# Patient Record
Sex: Male | Born: 1950 | Race: White | Hispanic: No | Marital: Married | Smoking: Former smoker
Health system: Southern US, Community
[De-identification: ages and names within clinical notes are randomized; demographics above are authoritative.]

## PROBLEM LIST (undated history)

## (undated) DIAGNOSIS — E119 Type 2 diabetes mellitus without complications: Secondary | ICD-10-CM

## (undated) DIAGNOSIS — D72819 Decreased white blood cell count, unspecified: Secondary | ICD-10-CM

## (undated) DIAGNOSIS — E785 Hyperlipidemia, unspecified: Secondary | ICD-10-CM

## (undated) DIAGNOSIS — H269 Unspecified cataract: Secondary | ICD-10-CM

## (undated) HISTORY — DX: Decreased white blood cell count, unspecified: D72.819

## (undated) HISTORY — DX: Type 2 diabetes mellitus without complications: E11.9

## (undated) HISTORY — DX: Hyperlipidemia, unspecified: E78.5

## (undated) HISTORY — DX: Unspecified cataract: H26.9

---

## 1995-10-20 DIAGNOSIS — E119 Type 2 diabetes mellitus without complications: Secondary | ICD-10-CM

## 1995-10-20 DIAGNOSIS — E118 Type 2 diabetes mellitus with unspecified complications: Secondary | ICD-10-CM | POA: Insufficient documentation

## 1995-10-20 DIAGNOSIS — E1169 Type 2 diabetes mellitus with other specified complication: Secondary | ICD-10-CM | POA: Insufficient documentation

## 1995-10-20 HISTORY — DX: Type 2 diabetes mellitus without complications: E11.9

## 1996-02-20 ENCOUNTER — Encounter: Payer: Self-pay | Admitting: Family Medicine

## 1996-02-20 DIAGNOSIS — E785 Hyperlipidemia, unspecified: Secondary | ICD-10-CM

## 1996-02-20 DIAGNOSIS — E1169 Type 2 diabetes mellitus with other specified complication: Secondary | ICD-10-CM | POA: Insufficient documentation

## 1996-02-20 HISTORY — DX: Hyperlipidemia, unspecified: E78.5

## 1997-01-19 ENCOUNTER — Encounter: Payer: Self-pay | Admitting: Family Medicine

## 1997-01-19 LAB — CONVERTED CEMR LAB: Hgb A1c MFr Bld: 6.3 %

## 1997-12-19 ENCOUNTER — Encounter: Payer: Self-pay | Admitting: Family Medicine

## 1997-12-19 LAB — CONVERTED CEMR LAB: Hgb A1c MFr Bld: 6.2 %

## 1998-01-19 ENCOUNTER — Encounter: Payer: Self-pay | Admitting: Family Medicine

## 1999-01-20 ENCOUNTER — Encounter: Payer: Self-pay | Admitting: Family Medicine

## 1999-01-20 LAB — CONVERTED CEMR LAB
Hgb A1c MFr Bld: 6 %
Microalbumin U total vol: 7.3 mg/L

## 2000-01-20 ENCOUNTER — Encounter: Payer: Self-pay | Admitting: Family Medicine

## 2000-01-20 LAB — CONVERTED CEMR LAB: Hgb A1c MFr Bld: 5.9 %

## 2000-07-19 ENCOUNTER — Encounter: Payer: Self-pay | Admitting: Family Medicine

## 2000-07-19 LAB — CONVERTED CEMR LAB: Hgb A1c MFr Bld: 5.7 %

## 2001-01-19 ENCOUNTER — Encounter: Payer: Self-pay | Admitting: Family Medicine

## 2001-01-19 LAB — CONVERTED CEMR LAB
Hgb A1c MFr Bld: 6.6 %
PSA: 0.2 ng/mL

## 2001-06-19 ENCOUNTER — Encounter: Payer: Self-pay | Admitting: Family Medicine

## 2002-01-19 ENCOUNTER — Encounter: Payer: Self-pay | Admitting: Family Medicine

## 2002-01-19 LAB — CONVERTED CEMR LAB
Hgb A1c MFr Bld: 6.2 %
Microalbumin U total vol: 10.2 mg/L
PSA: 0.2 ng/mL

## 2002-07-20 ENCOUNTER — Encounter: Payer: Self-pay | Admitting: Family Medicine

## 2003-03-20 ENCOUNTER — Encounter: Payer: Self-pay | Admitting: Family Medicine

## 2003-03-20 LAB — CONVERTED CEMR LAB: Microalbumin U total vol: 6.7 mg/L

## 2003-09-20 ENCOUNTER — Encounter: Payer: Self-pay | Admitting: Family Medicine

## 2003-11-29 ENCOUNTER — Ambulatory Visit: Payer: Self-pay | Admitting: Family Medicine

## 2004-03-19 ENCOUNTER — Encounter: Payer: Self-pay | Admitting: Family Medicine

## 2004-03-27 ENCOUNTER — Ambulatory Visit: Payer: Self-pay | Admitting: Family Medicine

## 2004-03-31 ENCOUNTER — Ambulatory Visit: Payer: Self-pay | Admitting: Family Medicine

## 2004-09-19 ENCOUNTER — Ambulatory Visit: Payer: Self-pay | Admitting: Family Medicine

## 2004-09-19 LAB — CONVERTED CEMR LAB: Hgb A1c MFr Bld: 6.4 %

## 2004-09-23 ENCOUNTER — Ambulatory Visit: Payer: Self-pay | Admitting: Family Medicine

## 2004-10-07 ENCOUNTER — Ambulatory Visit: Payer: Self-pay | Admitting: Family Medicine

## 2004-12-02 ENCOUNTER — Ambulatory Visit: Payer: Self-pay | Admitting: Family Medicine

## 2005-02-19 ENCOUNTER — Encounter: Payer: Self-pay | Admitting: Family Medicine

## 2005-03-23 ENCOUNTER — Ambulatory Visit: Payer: Self-pay | Admitting: Family Medicine

## 2005-10-19 ENCOUNTER — Encounter: Payer: Self-pay | Admitting: Family Medicine

## 2005-10-19 LAB — CONVERTED CEMR LAB
Hgb A1c MFr Bld: 6.5 %
Microalbumin U total vol: 3.8 mg/L

## 2005-10-20 ENCOUNTER — Ambulatory Visit: Payer: Self-pay | Admitting: Family Medicine

## 2005-11-06 ENCOUNTER — Ambulatory Visit: Payer: Self-pay | Admitting: Family Medicine

## 2005-11-19 ENCOUNTER — Ambulatory Visit: Payer: Self-pay | Admitting: Family Medicine

## 2006-04-20 ENCOUNTER — Encounter: Payer: Self-pay | Admitting: Family Medicine

## 2006-04-20 LAB — CONVERTED CEMR LAB: Hgb A1c MFr Bld: 6.6 %

## 2006-05-06 ENCOUNTER — Ambulatory Visit: Payer: Self-pay | Admitting: Family Medicine

## 2006-05-06 LAB — CONVERTED CEMR LAB: Hgb A1c MFr Bld: 6.6 % — ABNORMAL HIGH (ref 4.6–6.0)

## 2006-05-10 ENCOUNTER — Ambulatory Visit: Payer: Self-pay | Admitting: Family Medicine

## 2006-06-21 ENCOUNTER — Telehealth: Payer: Self-pay | Admitting: Family Medicine

## 2006-10-07 ENCOUNTER — Encounter: Payer: Self-pay | Admitting: Family Medicine

## 2006-11-08 ENCOUNTER — Ambulatory Visit: Payer: Self-pay | Admitting: Family Medicine

## 2006-11-08 DIAGNOSIS — I10 Essential (primary) hypertension: Secondary | ICD-10-CM | POA: Insufficient documentation

## 2006-11-08 LAB — CONVERTED CEMR LAB
AST: 25 units/L (ref 0–37)
Albumin: 4 g/dL (ref 3.5–5.2)
Alkaline Phosphatase: 43 units/L (ref 39–117)
BUN: 16 mg/dL (ref 6–23)
Basophils Absolute: 0 10*3/uL (ref 0.0–0.1)
Basophils Relative: 0.6 % (ref 0.0–1.0)
CO2: 31 meq/L (ref 19–32)
Chloride: 106 meq/L (ref 96–112)
Cholesterol: 139 mg/dL (ref 0–200)
Creatinine, Ser: 1.1 mg/dL (ref 0.4–1.5)
HCT: 39.1 % (ref 39.0–52.0)
LDL Cholesterol: 84 mg/dL (ref 0–99)
MCHC: 35.1 g/dL (ref 30.0–36.0)
Neutrophils Relative %: 60.5 % (ref 43.0–77.0)
PSA: 0.23 ng/mL (ref 0.10–4.00)
RBC: 4.09 M/uL — ABNORMAL LOW (ref 4.22–5.81)
RDW: 12.6 % (ref 11.5–14.6)
Sodium: 142 meq/L (ref 135–145)
TSH: 2.9 microintl units/mL (ref 0.35–5.50)
Total Bilirubin: 0.9 mg/dL (ref 0.3–1.2)
Total CHOL/HDL Ratio: 4.2
VLDL: 22 mg/dL (ref 0–40)
WBC: 4 10*3/uL — ABNORMAL LOW (ref 4.5–10.5)

## 2006-11-15 ENCOUNTER — Ambulatory Visit: Payer: Self-pay | Admitting: Family Medicine

## 2006-12-06 ENCOUNTER — Encounter: Payer: Self-pay | Admitting: Family Medicine

## 2006-12-07 ENCOUNTER — Ambulatory Visit: Payer: Self-pay | Admitting: Family Medicine

## 2006-12-08 ENCOUNTER — Encounter (INDEPENDENT_AMBULATORY_CARE_PROVIDER_SITE_OTHER): Payer: Self-pay | Admitting: *Deleted

## 2007-05-02 ENCOUNTER — Ambulatory Visit: Payer: Self-pay | Admitting: Family Medicine

## 2007-05-04 ENCOUNTER — Ambulatory Visit: Payer: Self-pay | Admitting: Family Medicine

## 2007-06-09 ENCOUNTER — Ambulatory Visit: Payer: Self-pay | Admitting: Family Medicine

## 2007-11-30 ENCOUNTER — Ambulatory Visit: Payer: Self-pay | Admitting: Family Medicine

## 2007-11-30 LAB — CONVERTED CEMR LAB
ALT: 24 units/L (ref 0–53)
AST: 24 units/L (ref 0–37)
Albumin: 3.9 g/dL (ref 3.5–5.2)
BUN: 15 mg/dL (ref 6–23)
CO2: 29 meq/L (ref 19–32)
Chloride: 106 meq/L (ref 96–112)
Cholesterol: 116 mg/dL (ref 0–200)
Creatinine, Ser: 1.1 mg/dL (ref 0.4–1.5)
Creatinine,U: 205.7 mg/dL
Glucose, Bld: 105 mg/dL — ABNORMAL HIGH (ref 70–99)
PSA: 0.22 ng/mL (ref 0.10–4.00)
Triglycerides: 76 mg/dL (ref 0–149)

## 2007-12-05 ENCOUNTER — Ambulatory Visit: Payer: Self-pay | Admitting: Family Medicine

## 2007-12-12 ENCOUNTER — Encounter: Payer: Self-pay | Admitting: Family Medicine

## 2007-12-23 ENCOUNTER — Ambulatory Visit: Payer: Self-pay | Admitting: Family Medicine

## 2007-12-23 LAB — CONVERTED CEMR LAB
OCCULT 2: NEGATIVE
OCCULT 3: NEGATIVE

## 2007-12-26 ENCOUNTER — Encounter (INDEPENDENT_AMBULATORY_CARE_PROVIDER_SITE_OTHER): Payer: Self-pay | Admitting: *Deleted

## 2008-03-22 ENCOUNTER — Encounter (INDEPENDENT_AMBULATORY_CARE_PROVIDER_SITE_OTHER): Payer: Self-pay | Admitting: *Deleted

## 2008-03-22 ENCOUNTER — Telehealth: Payer: Self-pay | Admitting: Family Medicine

## 2008-03-26 ENCOUNTER — Encounter: Payer: Self-pay | Admitting: Family Medicine

## 2008-12-10 ENCOUNTER — Ambulatory Visit: Payer: Self-pay | Admitting: Family Medicine

## 2008-12-10 LAB — CONVERTED CEMR LAB
ALT: 26 units/L (ref 0–53)
Bilirubin, Direct: 0 mg/dL (ref 0.0–0.3)
Chloride: 103 meq/L (ref 96–112)
Creatinine, Ser: 1.2 mg/dL (ref 0.4–1.5)
LDL Cholesterol: 81 mg/dL (ref 0–99)
Microalb, Ur: 0.4 mg/dL (ref 0.0–1.9)
Potassium: 4.5 meq/L (ref 3.5–5.1)
Total Bilirubin: 1 mg/dL (ref 0.3–1.2)
Total CHOL/HDL Ratio: 4
Triglycerides: 111 mg/dL (ref 0.0–149.0)
VLDL: 22.2 mg/dL (ref 0.0–40.0)

## 2008-12-17 ENCOUNTER — Ambulatory Visit: Payer: Self-pay | Admitting: Family Medicine

## 2009-01-04 ENCOUNTER — Ambulatory Visit: Payer: Self-pay | Admitting: Family Medicine

## 2009-01-04 LAB — CONVERTED CEMR LAB
OCCULT 1: NEGATIVE
OCCULT 2: NEGATIVE
OCCULT 3: NEGATIVE

## 2009-01-07 ENCOUNTER — Encounter: Payer: Self-pay | Admitting: Family Medicine

## 2009-06-10 ENCOUNTER — Ambulatory Visit: Payer: Self-pay | Admitting: Family Medicine

## 2009-06-10 LAB — CONVERTED CEMR LAB: Hgb A1c MFr Bld: 7 % — ABNORMAL HIGH (ref 4.6–6.5)

## 2009-06-18 ENCOUNTER — Ambulatory Visit: Payer: Self-pay | Admitting: Family Medicine

## 2009-08-22 ENCOUNTER — Encounter (INDEPENDENT_AMBULATORY_CARE_PROVIDER_SITE_OTHER): Payer: Self-pay | Admitting: *Deleted

## 2009-10-24 ENCOUNTER — Ambulatory Visit: Payer: Self-pay | Admitting: Family Medicine

## 2009-10-24 LAB — CONVERTED CEMR LAB
AST: 18 units/L (ref 0–37)
Cholesterol: 136 mg/dL (ref 0–200)

## 2009-10-30 ENCOUNTER — Ambulatory Visit: Payer: Self-pay | Admitting: Family Medicine

## 2009-12-16 ENCOUNTER — Encounter: Payer: Self-pay | Admitting: Family Medicine

## 2010-02-18 NOTE — Assessment & Plan Note (Signed)
Summary: 6 MONTH FOLLOW UP/[facility]H   Vital Signs:  Patient profile:   60 year old male Weight:      169.75 pounds Temp:     98.3 degrees F oral Pulse rate:   76 / minute Pulse rhythm:   regular BP sitting:   108 / 76  (left arm) Cuff size:   regular  Vitals Entered By: Sydell Axon LPN (Jun 18, 2009 3:21 PM) CC: 6 Month follow-up   History of Present Illness: Pt here to recheck DM. He thinks his numbers lately have been better but essentially only checks them in the AM. He is aware of some numbers a few months ago being as high as 200s. He does not admit to indiscretion with diet. We discussed again going to 4 day progressiojn of checking his sugar and the motivation to do so...getting sample throughout the day of sugars. He also relates needing "to go generic" if poss. There is no substitute for Actos and he is aware that would menan insulin...which he doesn't want or is not willing yet to do. He would however like to switcjh his Lipitor if poss.  Problems Prior to Update: 1)  Special Screening Malig Neoplasms Other Sites  (ICD-V76.49) 2)  Health Maintenance Exam  (ICD-V70.0) 3)  Hypertension, Benign Essential  (ICD-401.1) 4)  Screening For Malignant Neoplasm, Prostate  (ICD-V76.44) 5)  Hyperlipidemia  (ICD-272.4) 6)  Diabetes Mellitus, Type II  (ICD-250.00)  Medications Prior to Update: 1)  Lipitor 20 Mg Tabs (Atorvastatin Calcium) .... Take 1 Tablet By Mouth Once A Day 2)  Lisinopril 2.5 Mg Tabs (Lisinopril) .... Take 1 Tablet By Mouth Once A Day 3)  Metformin Hcl 1000 Mg  Tabs (Metformin Hcl) .... Take 1 Tablet By Mouth Two Times A Day 4)  Actos 45 Mg Tabs (Pioglitazone Hcl) .... One Tab By Mouth Once Daily 5)  Glucotrol Xl 10 Mg  Tb24 (Glipizide) .... Take 1 Tablet By Mouth in Am 1/2 Tablet At Bedtime 6)  Accu-Chek Advantage Test   Strp (Glucose Blood) .... Check Sugar Once Every Day  Allergies: 1)  ! Pcn  Physical Exam  General:  Well-developed,well-nourished,in no acute  distress; alert,appropriate and cooperative throughout examination Head:  Normocephalic and atraumatic without obvious abnormalities. No apparent alopecia or balding. Sinuses NT. Eyes:  Conjunctiva clear bilaterally.  Ears:  External ear exam shows no significant lesions or deformities.  Otoscopic examination reveals clear canals, tympanic membranes are intact bilaterally without bulging, retraction, inflammation or discharge. Hearing is grossly normal bilaterally. Nose:  External nasal examination shows no deformity or inflammation. Nasal mucosa are pink and moist without lesions or exudates. Mouth:  Oral mucosa and oropharynx without lesions or exudates.  Teeth in good repair. Neck:  No deformities, masses, or tenderness noted. Chest Wall:  No deformities, masses, tenderness or gynecomastia noted. Lungs:  Normal respiratory effort, chest expands symmetrically. Lungs are clear to auscultation, no crackles or wheezes. Heart:  Normal rate and regular rhythm. S1 and S2 normal without gallop, murmur, click, rub or other extra sounds.   Impression & Recommendations:  Problem # 1:  DIABETES MELLITUS, TYPE II (ICD-250.00) Assessment Deteriorated  Significantly worse...start 4 day progression of monitoring sugar. Declines increasing meds at this point. His updated medication list for this problem includes:    Lisinopril 2.5 Mg Tabs (Lisinopril) .Marland Kitchen... Take 1 tablet by mouth once a day    Metformin Hcl 1000 Mg Tabs (Metformin hcl) .Marland Kitchen... Take 1 tablet by mouth two times a day  Actos 45 Mg Tabs (Pioglitazone hcl) ..... One tab by mouth once daily    Glucotrol Xl 10 Mg Tb24 (Glipizide) .Marland Kitchen... Take 1 tablet by mouth in am 1/2 tablet at bedtime  Labs Reviewed: Creat: 1.2 (12/10/2008)   Microalbumin: 3.8 (10/19/2005)  Last Eye Exam: normal (12/10/2008) Reviewed HgBA1c results: 7.0 (06/10/2009)  6.9 (12/10/2008)  Problem # 2:  HYPERLIPIDEMIA (ICD-272.4) Assessment: Unchanged  Switch to  Pravastatin in 6 weeks and then labs 6 weeks and thrree month after that. Make appt to be seen after the seconfd set of labs. His updated medication list for this problem includes:    Lipitor 20 Mg Tabs (Atorvastatin calcium) .Marland Kitchen... Take 1 tablet by mouth once a day    Simvastatin 40 Mg Tabs (Simvastatin) ..... One tab by mouth at night  Labs Reviewed: SGOT: 27 (12/10/2008)   SGPT: 26 (12/10/2008)   HDL:38.70 (12/10/2008), 31.7 (11/30/2007)  LDL:81 (12/10/2008), 69 (11/30/2007)  Chol:142 (12/10/2008), 116 (11/30/2007)  Trig:111.0 (12/10/2008), 76 (11/30/2007)  Complete Medication List: 1)  Lipitor 20 Mg Tabs (Atorvastatin calcium) .... Take 1 tablet by mouth once a day 2)  Lisinopril 2.5 Mg Tabs (Lisinopril) .... Take 1 tablet by mouth once a day 3)  Metformin Hcl 1000 Mg Tabs (Metformin hcl) .... Take 1 tablet by mouth two times a day 4)  Actos 45 Mg Tabs (Pioglitazone hcl) .... One tab by mouth once daily 5)  Glucotrol Xl 10 Mg Tb24 (Glipizide) .... Take 1 tablet by mouth in am 1/2 tablet at bedtime 6)  Accu-chek Advantage Test Strp (Glucose blood) .... Check sugar once every day 7)  Simvastatin 40 Mg Tabs (Simvastatin) .... One tab by mouth at night  Patient Instructions: 1)  SGOT, SGPT 272. 4 mid Aug 2)   SGOT, SGPT, CHOL PROFILE  272. 4    last week of Sep or first week of Oct.  See me first week of Oct. Prescriptions: SIMVASTATIN 40 MG TABS (SIMVASTATIN) one tab by mouth at night  #90 x 3   Entered and Authorized by:   Shaune Leeks MD   Signed by:   Shaune Leeks MD on 06/18/2009   Method used:   Print then Give to Patient   RxID:   [id number]   Current Allergies (reviewed today): ! PCN

## 2010-02-18 NOTE — Letter (Signed)
Summary: Nadara Eaton letter  [facility]  [address] [place], [place] [address]   Phone: [phone]  Fax: [phone]       08/22/2009 MRN: [id number]  DANE KOPKE [address] [place], [place]  [address]  Dear Mr. Jeral[facility] [place], and [facility] announce the retirement of Arta Silence, M.D., from full-time practice at the [facility] office effective July 18, 2009 and his plans of returning part-time.  It is important to Dr. Hetty Ely and to our practice that you understand that [facility] has seven physicians in our office for your health care needs.  We will continue to offer the same exceptional care that you have today.    Dr. Hetty Ely has spoken to many of you about his plans for retirement and returning part-time in the fall.   We will continue to work with you through the transition to schedule appointments for you in the office and meet the high standards that [place] is committed to.   Again, it is with great pleasure that we share the news that Dr. Hetty Ely will return to [facility] at [facility] in October of 2011 with a reduced schedule.    If you have any questions, or would like to request an appointment with one of our physicians, please call us at [phone] and press the option for Scheduling an appointment.  We take pleasure in providing you with excellent patient care and look forward to seeing you at your next office visit.  Our [facility] Physicians are:  Tillman Abide, M.D. Laurita Quint, M.D. Roxy Manns, M.D. Kerby Nora, M.D. Hannah Beat, M.D. Ruthe Mannan, M.D. We proudly welcomed Raechel Ache, M.D. and Eustaquio Boyden, M.D. to the practice in July/August 2011.  Sincerely,   [facility]

## 2010-02-18 NOTE — Assessment & Plan Note (Signed)
Summary: F/U ON CHANGING MEDS/DLO   Vital Signs:  Patient profile:   60 year old male Weight:      172.75 pounds BMI:     25.98 Temp:     98.5 degrees F oral Pulse rate:   68 / minute Pulse rhythm:   regular BP sitting:   112 / 74  (left arm) Cuff size:   regular  Vitals Entered By: Sydell Axon LPN (October 30, 2009 12:03 PM) CC: Follow-up on change of medication   History of Present Illness: Pt here for followup. Blood sugars have been ranging 80-90 in AM, 130-160 at night. He has been more careful with his diet since being seen last time, both sugar and chol. He has had no probs with Sinmvastatin. Has been on it  since Sep 1. approx  6 weeks. We switched from Lipitor 20 to Simvastatin 40. He feels well and has noticed no problems with it.  Problems Prior to Update: 1)  Special Screening Malig Neoplasms Other Sites  (ICD-V76.49) 2)  Health Maintenance Exam  (ICD-V70.0) 3)  Hypertension, Benign Essential  (ICD-401.1) 4)  Screening For Malignant Neoplasm, Prostate  (ICD-V76.44) 5)  Hyperlipidemia  (ICD-272.4) 6)  Diabetes Mellitus, Type II  (ICD-250.00)  Medications Prior to Update: 1)  Lipitor 20 Mg Tabs (Atorvastatin Calcium) .... Take 1 Tablet By Mouth Once A Day 2)  Lisinopril 2.5 Mg Tabs (Lisinopril) .... Take 1 Tablet By Mouth Once A Day 3)  Metformin Hcl 1000 Mg  Tabs (Metformin Hcl) .... Take 1 Tablet By Mouth Two Times A Day 4)  Actos 45 Mg Tabs (Pioglitazone Hcl) .... One Tab By Mouth Once Daily 5)  Glucotrol Xl 10 Mg  Tb24 (Glipizide) .... Take 1 Tablet By Mouth in Am 1/2 Tablet At Bedtime 6)  Accu-Chek Advantage Test   Strp (Glucose Blood) .... Check Sugar Once Every Day 7)  Simvastatin 40 Mg Tabs (Simvastatin) .... One Tab By Mouth At Night  Allergies: 1)  ! Pcn  Physical Exam  General:  Well-developed,well-nourished,in no acute distress; alert,appropriate and cooperative throughout examination Head:  Normocephalic and atraumatic without obvious abnormalities.  No apparent alopecia or balding. Sinuses NT. Eyes:  Conjunctiva clear bilaterally.  Ears:  External ear exam shows no significant lesions or deformities.  Otoscopic examination reveals clear canals, tympanic membranes are intact bilaterally without bulging, retraction, inflammation or discharge. Hearing is grossly normal bilaterally. Nose:  External nasal examination shows no deformity or inflammation. Nasal mucosa are pink and moist without lesions or exudates. Mouth:  Oral mucosa and oropharynx without lesions or exudates.  Teeth in good repair. Neck:  No deformities, masses, or tenderness noted. Chest Wall:  No deformities, masses, tenderness or gynecomastia noted. Lungs:  Normal respiratory effort, chest expands symmetrically. Lungs are clear to auscultation, no crackles or wheezes. Heart:  Normal rate and regular rhythm. S1 and S2 normal without gallop, murmur, click, rub or other extra sounds. Abdomen:  Bowel sounds positive,abdomen soft and non-tender without masses, organomegaly or hernias noted.   Impression & Recommendations:  Problem # 1:  HYPERLIPIDEMIA (ICD-272.4) Assessment Improved Nos actually better on 40 of Simva vs 20 of Lipitor. Cont the Simva. The following medications were removed from the medication list:    Lipitor 20 Mg Tabs (Atorvastatin calcium) .Marland Kitchen... Take 1 tablet by mouth once a day His updated medication list for this problem includes:    Simvastatin 40 Mg Tabs (Simvastatin) ..... One tab by mouth at night  Labs Reviewed: SGOT: 18 (10/24/2009)  SGPT: 19 (10/24/2009)   HDL:41.60 (10/24/2009), 38.70 (12/10/2008)  LDL:75 (10/24/2009), 81 (12/10/2008)  Chol:136 (10/24/2009), 142 (12/10/2008)  Trig:98.0 (10/24/2009), 111.0 (12/10/2008)  Problem # 2:  DIABETES MELLITUS, TYPE II (ICD-250.00) Assessment: Unchanged  Nos sound slightly better which should translate to better A1C, altho not checked. Will do so at Comp Exam. His updated medication list for this  problem includes:    Lisinopril 2.5 Mg Tabs (Lisinopril) .Marland Kitchen... Take 1 tablet by mouth once a day    Metformin Hcl 1000 Mg Tabs (Metformin hcl) .Marland Kitchen... Take 1 tablet by mouth two times a day    Actos 45 Mg Tabs (Pioglitazone hcl) ..... One tab by mouth once daily    Glucotrol Xl 10 Mg Tb24 (Glipizide) .Marland Kitchen... Take 1 tablet by mouth in am 1/2 tablet at bedtime  Labs Reviewed: Creat: 1.2 (12/10/2008)   Microalbumin: 3.8 (10/19/2005)  Last Eye Exam: normal (12/10/2008) Reviewed HgBA1c results: 7.0 (06/10/2009)  6.9 (12/10/2008)  Problem # 3:  HYPERTENSION, BENIGN ESSENTIAL (ICD-401.1) Assessment: Unchanged Stable. Cont curr meds. His updated medication list for this problem includes:    Lisinopril 2.5 Mg Tabs (Lisinopril) .Marland Kitchen... Take 1 tablet by mouth once a day  BP today: 112/74 Prior BP: 108/76 (06/18/2009)  Labs Reviewed: K+: 4.5 (12/10/2008) Creat: : 1.2 (12/10/2008)   Chol: 136 (10/24/2009)   HDL: 41.60 (10/24/2009)   LDL: 75 (10/24/2009)   TG: 98.0 (10/24/2009)  Complete Medication List: 1)  Lisinopril 2.5 Mg Tabs (Lisinopril) .... Take 1 tablet by mouth once a day 2)  Metformin Hcl 1000 Mg Tabs (Metformin hcl) .... Take 1 tablet by mouth two times a day 3)  Actos 45 Mg Tabs (Pioglitazone hcl) .... One tab by mouth once daily 4)  Glucotrol Xl 10 Mg Tb24 (Glipizide) .... Take 1 tablet by mouth in am 1/2 tablet at bedtime 5)  Accu-chek Advantage Test Strp (Glucose blood) .... Check sugar once every day 6)  Simvastatin 40 Mg Tabs (Simvastatin) .... One tab by mouth at night  Patient Instructions: 1)  RTC after the New Year for Comp Exam, labs prior.  Current Allergies (reviewed today): ! PCN

## 2010-02-18 NOTE — Consult Note (Signed)
Summary: [facility],Note  John Yaokum,O.D.,[facility],Note   Imported By: Beau Fanny 12/17/2009 13:45:12  _____________________________________________________________________  External Attachment:    Type:   Image     Comment:   External Document  Appended Document: Chesley Noon    Clinical Lists Changes  Observations: Added new observation of DMEYEEXAMNXT: 12/2010 (12/17/2009 16:11) Added new observation of DMEYEEXMRES: normal (12/16/2009 16:12) Added new observation of EYE EXAM BY: [facility], Dr Bennett Scrape (12/16/2009 16:12) Added new observation of DIAB EYE EX: normal (12/16/2009 16:12)        Diabetes Management Exam:    Eye Exam:       Eye Exam done elsewhere          Date: 12/16/2009          Results: normal          Done by: [facility], Dr Bennett Scrape

## 2010-03-31 ENCOUNTER — Other Ambulatory Visit (INDEPENDENT_AMBULATORY_CARE_PROVIDER_SITE_OTHER): Payer: 59

## 2010-03-31 ENCOUNTER — Encounter: Payer: Self-pay | Admitting: *Deleted

## 2010-03-31 ENCOUNTER — Other Ambulatory Visit: Payer: Self-pay | Admitting: Family Medicine

## 2010-03-31 DIAGNOSIS — Z0000 Encounter for general adult medical examination without abnormal findings: Secondary | ICD-10-CM

## 2010-03-31 DIAGNOSIS — I10 Essential (primary) hypertension: Secondary | ICD-10-CM

## 2010-03-31 DIAGNOSIS — Z125 Encounter for screening for malignant neoplasm of prostate: Secondary | ICD-10-CM

## 2010-03-31 DIAGNOSIS — E785 Hyperlipidemia, unspecified: Secondary | ICD-10-CM

## 2010-03-31 DIAGNOSIS — E119 Type 2 diabetes mellitus without complications: Secondary | ICD-10-CM

## 2010-03-31 LAB — LIPID PANEL
Cholesterol: 149 mg/dL (ref 0–200)
LDL Cholesterol: 91 mg/dL (ref 0–99)
VLDL: 19.2 mg/dL (ref 0.0–40.0)

## 2010-03-31 LAB — RENAL FUNCTION PANEL
Albumin: 4.2 g/dL (ref 3.5–5.2)
Calcium: 8.9 mg/dL (ref 8.4–10.5)
Chloride: 100 mEq/L (ref 96–112)
Glucose, Bld: 154 mg/dL — ABNORMAL HIGH (ref 70–99)
Phosphorus: 3.9 mg/dL (ref 2.3–4.6)
Potassium: 4.7 mEq/L (ref 3.5–5.1)
Sodium: 137 mEq/L (ref 135–145)

## 2010-03-31 LAB — VITAMIN B12: Vitamin B-12: 314 pg/mL (ref 211–911)

## 2010-03-31 LAB — HEPATIC FUNCTION PANEL
ALT: 21 U/L (ref 0–53)
AST: 20 U/L (ref 0–37)
Albumin: 4.2 g/dL (ref 3.5–5.2)
Alkaline Phosphatase: 45 U/L (ref 39–117)

## 2010-03-31 LAB — MICROALBUMIN / CREATININE URINE RATIO: Microalb, Ur: 1 mg/dL (ref 0.0–1.9)

## 2010-03-31 LAB — HEMOGLOBIN A1C: Hgb A1c MFr Bld: 7 % — ABNORMAL HIGH (ref 4.6–6.5)

## 2010-04-03 ENCOUNTER — Encounter: Payer: Self-pay | Admitting: Family Medicine

## 2010-04-03 ENCOUNTER — Encounter (INDEPENDENT_AMBULATORY_CARE_PROVIDER_SITE_OTHER): Payer: 59 | Admitting: Family Medicine

## 2010-04-03 DIAGNOSIS — Z0000 Encounter for general adult medical examination without abnormal findings: Secondary | ICD-10-CM

## 2010-04-04 ENCOUNTER — Telehealth: Payer: Self-pay | Admitting: Family Medicine

## 2010-04-08 NOTE — Progress Notes (Signed)
Summary: Glucose machine, strips and lancets  Phone Note From Pharmacy Call back at [phone]   [name]: [facility] Call For: Dr. Hetty Ely  Summary of Call: Received faxed from pharmacy stating that the Accu-Check Advantage is no longer made and patient will need a new Rx.  Please advise. Initial call taken by: Linde Gillis CMA Duncan Dull),  April 04, 2010 11:02 AM  Follow-up for Phone Call        Pls give vebal order for "Glucometer of choice" with related acoutrements...lancets, strips, etc.  Follow-up by: Shaune Leeks MD,  April 04, 2010 12:16 PM    New/Updated Medications: BLOOD GLUCOSE SYSTEM PAK  KIT (BLOOD GLUCOSE MONITORING SUPPL) Patients choice of device: Use to check blood sugar once daily LANCETS  MISC (LANCETS) use to check blood sugar once daily CHOICE DM FORA G20 TEST STRIPS  STRP (GLUCOSE BLOOD) Patients choice: Use to check blood sugar once daily Prescriptions: CHOICE DM FORA G20 TEST STRIPS  STRP (GLUCOSE BLOOD) Patients choice: Use to check blood sugar once daily  #100 x 11   Entered by:   Linde Gillis CMA (AAMA)   Authorized by:   Shaune Leeks MD   Signed by:   Linde Gillis CMA (AAMA) on 04/04/2010   Method used:   Electronically to        [facility] (retail)       [address], [address]       [place], [place]  [address]       Ph: [phone]       Fax: [phone]   RxID:   [id number] LANCETS  MISC (LANCETS) use to check blood sugar once daily  #100 x 11   Entered by:   Linde Gillis CMA (AAMA)   Authorized by:   Shaune Leeks MD   Signed by:   Linde Gillis CMA (AAMA) on 04/04/2010   Method used:   Electronically to        [facility] (retail)       [address], [address]       [place], [place]  [address]       Ph: [phone]       Fax: [phone]   RxID:   [id number] BLOOD GLUCOSE SYSTEM PAK  KIT (BLOOD GLUCOSE MONITORING  SUPPL) Patients choice of device: Use to check blood sugar once daily  #1 x 0   Entered by:   Linde Gillis CMA (AAMA)   Authorized by:   Shaune Leeks MD   Signed by:   Linde Gillis CMA (AAMA) on 04/04/2010   Method used:   Electronically to        [facility] (retail)       [address], [address]       [place], [place]  [address]       Ph: [phone]       Fax: [phone]   RxID:   [id number]   Current Allergies (reviewed today): ! PCN

## 2010-04-08 NOTE — Assessment & Plan Note (Signed)
Summary: CPX/ [facility]   Vital Signs:  Patient profile:   60 year old male Height:      68.25 inches Weight:      170.50 pounds BMI:     25.83 Temp:     98.2 degrees F oral Pulse rate:   60 / minute Pulse rhythm:   regular BP sitting:   106 / 64  (left arm) Cuff size:   regular  Vitals Entered By: Lewanda Rife LPN (April 15, 2010 10:57 AM) CC: CPX   History of Present Illness: Pt here for Comp Exam. He feels well with no complaints.  His accucheck the day of bloodwork was 86. He typically is max 130.   Preventive Screening-Counseling & Management  Alcohol-Tobacco     Alcohol drinks/day: 0     Smoking Status: quit     Year Quit: 1987     Pack years: 20     Passive Smoke Exposure: yes  Caffeine-Diet-Exercise     Caffeine use/day: 2     Does Patient Exercise: yes     Type of exercise: bike, jogging     Times/week: 2  Problems Prior to Update: 1)  Special Screening Malig Neoplasms Other Sites  (ICD-V76.49) 2)  Health Maintenance Exam  (ICD-V70.0) 3)  Hypertension, Benign Essential  (ICD-401.1) 4)  Screening For Malignant Neoplasm, Prostate  (ICD-V76.44) 5)  Hyperlipidemia  (ICD-272.4) 6)  Diabetes Mellitus, Type II  (ICD-250.00)  Medications Prior to Update: 1)  Lisinopril 2.5 Mg Tabs (Lisinopril) .... Take 1 Tablet By Mouth Once A Day 2)  Metformin Hcl 1000 Mg  Tabs (Metformin Hcl) .... Take 1 Tablet By Mouth Two Times A Day 3)  Actos 45 Mg Tabs (Pioglitazone Hcl) .... One Tab By Mouth Once Daily 4)  Glucotrol Xl 10 Mg  Tb24 (Glipizide) .... Take 1 Tablet By Mouth in Am 1/2 Tablet At Bedtime 5)  Accu-Chek Advantage Test   Strp (Glucose Blood) .... Check Sugar Once Every Day 6)  Simvastatin 40 Mg Tabs (Simvastatin) .... One Tab By Mouth At Night  Current Medications (verified): 1)  Lisinopril 2.5 Mg Tabs (Lisinopril) .... Take 1 Tablet By Mouth Once A Day 2)  Metformin Hcl 1000 Mg  Tabs (Metformin Hcl) .... Take 1 Tablet By Mouth Two Times A Day 3)  Actos 45 Mg Tabs  (Pioglitazone Hcl) .... One Tab By Mouth Once Daily 4)  Glucotrol Xl 10 Mg  Tb24 (Glipizide) .... Take 1 Tablet By Mouth in Am 1/2 Tablet At Bedtime 5)  Accu-Chek Advantage Test   Strp (Glucose Blood) .... Check Sugar Once Every Day 6)  Simvastatin 40 Mg Tabs (Simvastatin) .... One Tab By Mouth At Night 7)  Aspirin 325 Mg  Tabs (Aspirin) .... Take 1 Tablet By Mouth Once A Day 8)  Vitamin C 500 Mg  Tabs (Ascorbic Acid) .... Take 1 Tablet By Mouth Once A Day  Allergies: 1)  ! 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 lives w/ wife 3 children  1 out of home 1 grandchild  Risk Factors: Alcohol Use: 0 (04/03/2010) Caffeine Use: 2 (04/03/2010) Exercise: yes (04/03/2010)  Risk Factors: Smoking Status: quit (04/03/2010) Passive Smoke Exposure: yes (04/03/2010)  Family History: father died age 47 August 17, 2022 arrhythm mother died age 70 09/19/2022 htn, dm,  stroke, bilat BKA, MI, kidney failure-dialysis Brother A 61  Brother A 53 CV + gm, chf, cabg, mother mi, father arrhythm HBP + motherDM + mother, self CA + lung gm depressioin negETOH/drug neg Stroke pos mother  Social History: Caffeine use/day:  2  Review of Systems General:  Denies chills, fatigue, fever, sweats, weakness, and weight loss. Eyes:  Denies blurring, discharge, and eye pain. ENT:  Denies decreased hearing, ear discharge, and ringing in ears. CV:  Denies chest pain or discomfort, fainting,  fatigue, palpitations, shortness of breath with exertion, swelling of feet, and swelling of hands. Resp:  Denies cough, shortness of breath, and wheezing. GI:  Denies abdominal pain, bloody stools, change in bowel habits, constipation, dark tarry stools, diarrhea, indigestion, loss of appetite, nausea, vomiting, vomiting blood, and yellowish skin color. GU:  Complains of nocturia; denies discharge, dysuria, and urinary frequency; occas. MS:  Denies joint pain, low back pain, muscle aches, cramps, and stiffness. Derm:  Denies dryness, itching, and rash. Neuro:  Denies numbness, poor balance, tingling, and tremors.  Physical Exam  General:  Well-developed,well-nourished,in no acute distress; alert,appropriate and cooperative throughout examination Head:  Normocephalic and atraumatic without obvious abnormalities. No apparent alopecia or balding. Sinuses NT. Eyes:  Conjunctiva clear bilaterally.  Ears:  External ear exam shows no significant lesions or deformities.  Otoscopic examination reveals clear canals, tympanic membranes are intact bilaterally without bulging, retraction, inflammation or discharge. Hearing is grossly normal bilaterally. Nose:  External nasal examination shows no deformity or inflammation. Nasal mucosa are pink and moist without lesions or exudates. Mouth:  Oral mucosa and oropharynx without lesions or exudates.  Teeth in good repair. Neck:  No deformities, masses, or tenderness noted. Chest Wall:  No deformities, masses, tenderness or gynecomastia noted. Breasts:  No masses or gynecomastia noted Lungs:  Normal respiratory effort, chest expands symmetrically. Lungs are clear to auscultation, no crackles or wheezes. Heart:  Normal rate and regular rhythm. S1 and S2 normal without gallop, murmur, click, rub or other extra sounds. Abdomen:  Bowel sounds positive,abdomen soft and non-tender without masses, organomegaly or hernias noted. Rectal:  No external abnormalities noted.  Normal sphincter tone. No rectal masses or tenderness. G neg. Genitalia:  Testes bilaterally descended without nodularity, tenderness or masses. No scrotal masses or lesions. No penis lesions or urethral discharge. Prostate:  Prostate gland firm and smooth, no enlargement, nodularity, tenderness, mass, asymmetry or induration. 20 gms Msk:  No deformity or scoliosis noted of thoracic or lumbar spine.   Pulses:  R and L carotid,radial,femoral,dorsalis pedis and posterior tibial pulses are full and equal bilaterally Extremities:  No clubbing, cyanosis, edema, or deformity noted with normal full range of motion of all joints.   Neurologic:  No cranial nerve deficits noted. Station and gait are normal. Sensory, motor and coordinative functions appear intact. Skin:  Intact without suspicious lesions or rashes Cervical Nodes:  No lymphadenopathy noted Inguinal Nodes:  No significant adenopathy Psych:  Cognition and judgment appear intact. Alert and cooperative with normal attention span and concentration. No apparent delusions, illusions, hallucinations   Impression & Recommendations:  Problem # 1:  HEALTH MAINTENANCE EXAM (ICD-V70.0) Assessment Comment Only  Reviewed preventive care protocols, scheduled due services, and  updated immunizations.  Problem # 2:  HYPERTENSION, BENIGN ESSENTIAL (ICD-401.1) Assessment: Unchanged Stable. His updated medication list for this problem includes:    Lisinopril 2.5 Mg Tabs (Lisinopril) .Marland Kitchen... Take 1 tablet by mouth once a day  BP today: 106/64 Prior BP: 112/74 (10/30/2009)  Labs Reviewed: K+: 4.7 (03/31/2010) Creat: : 1.2 (03/31/2010)   Chol: 149 (03/31/2010)   HDL: 38.70 (03/31/2010)   LDL: 91 (03/31/2010)   TG: 96.0 (03/31/2010)  Problem # 3:  DIABETES MELLITUS, TYPE II (ICD-250.00) Assessment: Unchanged Adequate but he can get his sugar lower. Discussed risk of Actos. His chol is fine, he takes good care of himself...should do well. He needs the  sugar control. His updated medication list for this problem includes:    Lisinopril 2.5 Mg Tabs (Lisinopril) .Marland Kitchen... Take 1 tablet by mouth once a day    Metformin Hcl 1000 Mg Tabs (Metformin hcl) .Marland Kitchen... Take 1 tablet by mouth two times a day    Actos 45 Mg Tabs (Pioglitazone hcl) ..... One tab by mouth once daily    Glucotrol Xl 10 Mg Tb24 (Glipizide) .Marland Kitchen... Take 1 tablet by mouth in am 1/2 tablet at bedtime    Aspirin 325 Mg Tabs (Aspirin) .Marland Kitchen... Take 1 tablet by mouth once a day  Labs Reviewed: Creat: 1.2 (03/31/2010)   Microalbumin: 3.8 (10/19/2005)  Last Eye Exam: normal (12/16/2009) Reviewed HgBA1c results: 7.0 (03/31/2010)  7.0 (06/10/2009)  Problem # 4:  HYPERLIPIDEMIA (ICD-272.4) Assessment: Unchanged Adequate but can get lower by being more careful. His updated medication list for this problem includes:    Simvastatin 40 Mg Tabs (Simvastatin) ..... One tab by mouth at night  Labs Reviewed: SGOT: 20 (03/31/2010)   SGPT: 21 (03/31/2010)   HDL:38.70 (03/31/2010), 41.60 (10/24/2009)  LDL:91 (03/31/2010), 75 (10/24/2009)  Chol:149 (03/31/2010), 136 (10/24/2009)  Trig:96.0 (03/31/2010), 98.0 (10/24/2009)  Complete Medication List: 1)  Lisinopril 2.5 Mg Tabs (Lisinopril) .... Take 1 tablet by mouth once a day 2)  Metformin Hcl 1000 Mg Tabs (Metformin hcl) .... Take 1 tablet by mouth two times a day 3)  Actos 45 Mg Tabs (Pioglitazone hcl) .... One tab by mouth once daily 4)  Glucotrol Xl 10 Mg Tb24 (Glipizide) .... Take 1 tablet by mouth in am 1/2 tablet at bedtime 5)  Accu-chek Advantage Test Strp (Glucose blood) .... Check sugar once every day 6)  Simvastatin 40 Mg Tabs (Simvastatin) .... One tab by mouth at night 7)  Aspirin 325 Mg Tabs (Aspirin) .... Take 1 tablet by mouth once a day 8)  Vitamin C 500 Mg Tabs (Ascorbic acid) .... Take 1 tablet by mouth once a day  Patient Instructions: 1)  RTC 6 mos, A1C prior Prescriptions: ACCU-CHEK ADVANTAGE TEST   STRP (GLUCOSE BLOOD) check  sugar once every day  #100 x 4   Entered and Authorized by:   Shaune Leeks MD   Signed by:   Shaune Leeks MD on 04/03/2010   Method used:   Electronically to        [facility] [address] (retail)       [address], [address]       [place], [place]  [address]       Ph: [phone]       Fax: [phone]   RxID:   [id number] SIMVASTATIN 40 MG TABS (SIMVASTATIN) one tab by mouth at night  #90 x 3   Entered and Authorized by:  Shaune Leeks MD   Signed by:   Shaune Leeks MD on 04/03/2010   Method used:   Electronically to        [facility] [address] (retail)       [address], [address]       [place], [place]  [address]       Ph: [phone]       Fax: [phone]   RxID:   [id number] ACTOS 45 MG TABS (PIOGLITAZONE HCL) one tab by mouth once daily  #90 x 3   Entered and Authorized by:   Shaune Leeks MD   Signed by:   Shaune Leeks MD on 04/03/2010   Method used:   Electronically to        [facility] [address] (retail)       [address], [address]       [place], [place]  [address]       Ph: [phone]       Fax: [phone]   RxID:   [id number] METFORMIN HCL 1000 MG  TABS (METFORMIN HCL) Take 1 tablet by mouth two times a day  #180 x 3   Entered and Authorized by:   Shaune Leeks MD   Signed by:   Shaune Leeks MD on 04/03/2010   Method used:   Electronically to        [facility] [address] (retail)       [address], [address]       [place], [place]  [address]       Ph: [phone]       Fax: [phone]   RxID:   [id number] LISINOPRIL 2.5 MG TABS (LISINOPRIL) Take 1 tablet by mouth once a day  #90 x 3   Entered and Authorized by:   Shaune Leeks MD   Signed by:   Shaune Leeks MD on 04/03/2010   Method used:   Electronically to        [facility] [address] (retail)       [address], [address]       [place], [place]  [address]       Ph: [phone]       Fax: [phone]   RxID:   [id number]    Orders Added: 1)  Est. Patient 40-64 years [[id number]]    Current Allergies (reviewed today): ! PCN

## 2010-04-08 NOTE — Letter (Signed)
Summary: [facility]: Immunoassay Fecal Occult Blood (iFOB) Order Form  [place] [facility]  [address] [place], [place] [address]   Phone: [phone]  Fax: [phone]      [facility]: Immunoassay Fecal Occult Blood (iFOB) Order Form   April 03, 2010 MRN: [id number]   Eduardo Pierce May 13, 1950   Physicican Name:______Dr Schaller___________________  Diagnosis Code:_______V70.0___________________      Eduardo Leeks MD

## 2010-04-10 ENCOUNTER — Other Ambulatory Visit: Payer: Self-pay | Admitting: *Deleted

## 2010-04-10 MED ORDER — GLIPIZIDE 10 MG PO TABS: ORAL_TABLET | ORAL | Status: DC

## 2010-04-15 ENCOUNTER — Other Ambulatory Visit (INDEPENDENT_AMBULATORY_CARE_PROVIDER_SITE_OTHER): Payer: 59 | Admitting: Family Medicine

## 2010-04-15 ENCOUNTER — Other Ambulatory Visit: Payer: 59

## 2010-04-15 DIAGNOSIS — Z1289 Encounter for screening for malignant neoplasm of other sites: Secondary | ICD-10-CM

## 2010-04-15 LAB — FECAL OCCULT BLOOD, GUAIAC: Fecal Occult Blood: NEGATIVE

## 2010-04-15 LAB — FECAL OCCULT BLOOD, IMMUNOCHEMICAL: Fecal Occult Bld: NEGATIVE

## 2010-04-16 ENCOUNTER — Encounter: Payer: Self-pay | Admitting: *Deleted

## 2010-09-25 ENCOUNTER — Encounter: Payer: Self-pay | Admitting: Family Medicine

## 2010-10-06 ENCOUNTER — Other Ambulatory Visit (INDEPENDENT_AMBULATORY_CARE_PROVIDER_SITE_OTHER): Payer: 59

## 2010-10-06 DIAGNOSIS — E119 Type 2 diabetes mellitus without complications: Secondary | ICD-10-CM

## 2010-10-09 ENCOUNTER — Encounter: Payer: Self-pay | Admitting: Family Medicine

## 2010-10-09 ENCOUNTER — Ambulatory Visit (INDEPENDENT_AMBULATORY_CARE_PROVIDER_SITE_OTHER): Payer: 59 | Admitting: Family Medicine

## 2010-10-09 DIAGNOSIS — E785 Hyperlipidemia, unspecified: Secondary | ICD-10-CM

## 2010-10-09 DIAGNOSIS — I10 Essential (primary) hypertension: Secondary | ICD-10-CM

## 2010-10-09 DIAGNOSIS — E119 Type 2 diabetes mellitus without complications: Secondary | ICD-10-CM

## 2010-10-09 NOTE — Patient Instructions (Signed)
Please schedule pt with Dr Reece Agar for followup and annual PE in 6 mos., labs prior.

## 2010-10-09 NOTE — Assessment & Plan Note (Signed)
Continues well controlled. Cont minimal Lisinopril. BP Readings from Last 3 Encounters:  10/09/10 114/76  04/03/10 106/64  10/30/09 112/74

## 2010-10-09 NOTE — Assessment & Plan Note (Signed)
LDL and Trigs were great when checked and with his improved exercise regimen, HDL should be improved.  Cont curr regimen.

## 2010-10-09 NOTE — Assessment & Plan Note (Addendum)
Good control which has improved over the last 6 mos. Cont good but hard work. Understands to look for edema, SOB or CP with Actos. Has reasonable lipid control on his statin. Lab Results  Component Value Date   HGBA1C 6.7* 10/06/2010

## 2010-10-09 NOTE — Progress Notes (Signed)
  Subjective:    Patient ID: Eduardo Pierce, male    DOB: October 12, 1950, 60 y.o.   MRN: [id number]  HPI Pt here for 6 month follow up of DM, Htn and Hyperchol. He has no complaints and feels well. His A1C has come down slightly and he has learned exercise is important for him. We discussed Actos and its risks. He understands. He has worked hard at keeping things under control.    Review of Systems  Constitutional: Negative for fever, chills, diaphoresis, activity change, appetite change and fatigue.  HENT: Negative for hearing loss, ear pain, congestion, sore throat, rhinorrhea, neck pain, neck stiffness, postnasal drip, sinus pressure, tinnitus and ear discharge.   Eyes: Negative for pain, discharge and visual disturbance.  Respiratory: Negative for cough, shortness of breath and wheezing.   Cardiovascular: Negative for chest pain and palpitations.       No SOB w/ exertion  Gastrointestinal:       No heartburn or swallowing problems.  Genitourinary:       No nocturia  Skin:       No itching or dryness.  Neurological:       No tingling or balance problems.  All other systems reviewed and are negative.       Objective:   Physical Exam  Constitutional: He appears well-developed and well-nourished. No distress.  HENT:  Head: Normocephalic and atraumatic.  Right Ear: External ear normal.  Left Ear: External ear normal.  Nose: Nose normal.  Mouth/Throat: Oropharynx is clear and moist.  Eyes: Conjunctivae and EOM are normal. Pupils are equal, round, and reactive to light. Right eye exhibits no discharge. Left eye exhibits no discharge.  Neck: Normal range of motion. Neck supple.  Cardiovascular: Normal rate and regular rhythm.   Pulmonary/Chest: Effort normal and breath sounds normal. He has no wheezes.  Lymphadenopathy:    He has no cervical adenopathy.  Skin: He is not diaphoretic.          Assessment & Plan:

## 2011-02-20 HISTORY — PX: OTHER SURGICAL HISTORY: SHX169

## 2011-03-04 ENCOUNTER — Telehealth: Payer: Self-pay | Admitting: *Deleted

## 2011-03-04 MED ORDER — GLUCOSE BLOOD VI STRP: ORAL_STRIP | Status: DC

## 2011-03-04 NOTE — Telephone Encounter (Signed)
Patient called stating that he has a new machine that he is using to check his blood sugar. Patient states that he needs a new script sent to the pharmacy for #50 test strips for the freestyle freedom lite test strips so that his insurance will pay for them.  Pharmacy-[facility]

## 2011-03-04 NOTE — Telephone Encounter (Signed)
sent in

## 2011-03-13 ENCOUNTER — Ambulatory Visit (INDEPENDENT_AMBULATORY_CARE_PROVIDER_SITE_OTHER): Payer: 59 | Admitting: Emergency Medicine

## 2011-03-13 ENCOUNTER — Emergency Department (HOSPITAL_COMMUNITY): Payer: 59

## 2011-03-13 ENCOUNTER — Inpatient Hospital Stay (HOSPITAL_COMMUNITY)
Admission: EM | Admit: 2011-03-13 | Discharge: 2011-03-15 | DRG: 312 | Disposition: A | Discharge status: Home / Self Care | Payer: 59 | Attending: Internal Medicine | Admitting: Internal Medicine

## 2011-03-13 ENCOUNTER — Encounter (HOSPITAL_COMMUNITY): Payer: Self-pay | Admitting: Emergency Medicine

## 2011-03-13 VITALS — BP 100/66 | HR 83 | Temp 97.4°F | Resp 16 | Ht 69.0 in | Wt 174.0 lb

## 2011-03-13 DIAGNOSIS — E118 Type 2 diabetes mellitus with unspecified complications: Secondary | ICD-10-CM | POA: Diagnosis present

## 2011-03-13 DIAGNOSIS — R55 Syncope and collapse: Secondary | ICD-10-CM

## 2011-03-13 DIAGNOSIS — E785 Hyperlipidemia, unspecified: Secondary | ICD-10-CM

## 2011-03-13 DIAGNOSIS — E119 Type 2 diabetes mellitus without complications: Secondary | ICD-10-CM

## 2011-03-13 DIAGNOSIS — Z79899 Other long term (current) drug therapy: Secondary | ICD-10-CM

## 2011-03-13 DIAGNOSIS — Z87891 Personal history of nicotine dependence: Secondary | ICD-10-CM

## 2011-03-13 DIAGNOSIS — R42 Dizziness and giddiness: Secondary | ICD-10-CM

## 2011-03-13 DIAGNOSIS — Z7982 Long term (current) use of aspirin: Secondary | ICD-10-CM

## 2011-03-13 DIAGNOSIS — R9431 Abnormal electrocardiogram [ECG] [EKG]: Secondary | ICD-10-CM

## 2011-03-13 DIAGNOSIS — I10 Essential (primary) hypertension: Secondary | ICD-10-CM | POA: Diagnosis present

## 2011-03-13 DIAGNOSIS — E1169 Type 2 diabetes mellitus with other specified complication: Secondary | ICD-10-CM | POA: Diagnosis present

## 2011-03-13 LAB — CBC
HCT: 40.6 % (ref 39.0–52.0)
MCV: 94.9 fL (ref 78.0–100.0)
Platelets: 127 10*3/uL — ABNORMAL LOW (ref 150–400)
RBC: 4.28 MIL/uL (ref 4.22–5.81)
WBC: 6.8 10*3/uL (ref 4.0–10.5)

## 2011-03-13 LAB — GLUCOSE, CAPILLARY: Glucose-Capillary: 184 mg/dL — ABNORMAL HIGH (ref 70–99)

## 2011-03-13 LAB — COMPREHENSIVE METABOLIC PANEL
AST: 18 U/L (ref 0–37)
Albumin: 3.9 g/dL (ref 3.5–5.2)
Alkaline Phosphatase: 51 U/L (ref 39–117)
BUN: 20 mg/dL (ref 6–23)
Chloride: 104 mEq/L (ref 96–112)
Creatinine, Ser: 0.99 mg/dL (ref 0.50–1.35)
Potassium: 4.1 mEq/L (ref 3.5–5.1)
Total Protein: 6.6 g/dL (ref 6.0–8.3)

## 2011-03-13 LAB — POCT I-STAT TROPONIN I
Troponin i, poc: 0 ng/mL (ref 0.00–0.08)
Troponin i, poc: 0 ng/mL (ref 0.00–0.08)
Troponin i, poc: 0 ng/mL (ref 0.00–0.08)

## 2011-03-13 LAB — DIFFERENTIAL
Basophils Absolute: 0 10*3/uL (ref 0.0–0.1)
Basophils Relative: 0 % (ref 0–1)
Eosinophils Absolute: 0.1 10*3/uL (ref 0.0–0.7)
Lymphocytes Relative: 2 % — ABNORMAL LOW (ref 12–46)
Lymphs Abs: 0.2 10*3/uL — ABNORMAL LOW (ref 0.7–4.0)
Monocytes Absolute: 0.4 10*3/uL (ref 0.1–1.0)

## 2011-03-13 MED ORDER — ONDANSETRON HCL 4 MG PO TABS: 4.0000 mg | ORAL_TABLET | Freq: Four times a day (QID) | ORAL | Status: DC | PRN

## 2011-03-13 MED ORDER — SODIUM CHLORIDE 0.9 % IV SOLN: Freq: Once | INTRAVENOUS | Status: DC

## 2011-03-13 MED ORDER — SODIUM CHLORIDE 0.9 % IV SOLN
INTRAVENOUS | Status: DC
  Administered 2011-03-13: 1000 mL via INTRAVENOUS

## 2011-03-13 MED ORDER — DOCUSATE SODIUM 100 MG PO CAPS
100.0000 mg | ORAL_CAPSULE | Freq: Two times a day (BID) | ORAL | Status: DC
  Administered 2011-03-13 – 2011-03-14 (×2): 100 mg via ORAL
  Filled 2011-03-13 (×5): qty 1

## 2011-03-13 MED ORDER — LISINOPRIL 2.5 MG PO TABS
2.5000 mg | ORAL_TABLET | Freq: Every day | ORAL | Status: DC
  Administered 2011-03-14 – 2011-03-15 (×2): 2.5 mg via ORAL
  Filled 2011-03-13 (×2): qty 1

## 2011-03-13 MED ORDER — SENNA 8.6 MG PO TABS
1.0000 | ORAL_TABLET | Freq: Two times a day (BID) | ORAL | Status: DC
  Administered 2011-03-13 – 2011-03-14 (×2): 8.6 mg via ORAL
  Filled 2011-03-13 (×6): qty 1

## 2011-03-13 MED ORDER — ASPIRIN 81 MG PO CHEW: 324.0000 mg | CHEWABLE_TABLET | Freq: Once | ORAL | Status: DC

## 2011-03-13 MED ORDER — ASPIRIN EC 325 MG PO TBEC
325.0000 mg | DELAYED_RELEASE_TABLET | Freq: Every day | ORAL | Status: DC
  Administered 2011-03-14 – 2011-03-15 (×2): 325 mg via ORAL
  Filled 2011-03-13 (×3): qty 1

## 2011-03-13 MED ORDER — ONDANSETRON HCL 4 MG/2ML IJ SOLN: 4.0000 mg | Freq: Four times a day (QID) | INTRAMUSCULAR | Status: DC | PRN

## 2011-03-13 MED ORDER — SODIUM CHLORIDE 0.9 % IJ SOLN
3.0000 mL | Freq: Two times a day (BID) | INTRAMUSCULAR | Status: DC
  Administered 2011-03-14: 3 mL via INTRAVENOUS

## 2011-03-13 MED ORDER — ACETAMINOPHEN 650 MG RE SUPP: 650.0000 mg | Freq: Four times a day (QID) | RECTAL | Status: DC | PRN

## 2011-03-13 MED ORDER — ACETAMINOPHEN 325 MG PO TABS
650.0000 mg | ORAL_TABLET | Freq: Four times a day (QID) | ORAL | Status: DC | PRN
  Filled 2011-03-13: qty 2

## 2011-03-13 MED ORDER — HEPARIN SODIUM (PORCINE) 5000 UNIT/ML IJ SOLN
5000.0000 [IU] | Freq: Three times a day (TID) | INTRAMUSCULAR | Status: DC
  Administered 2011-03-13 – 2011-03-15 (×5): 5000 [IU] via SUBCUTANEOUS
  Filled 2011-03-13 (×8): qty 1

## 2011-03-13 MED ORDER — SIMVASTATIN 40 MG PO TABS
40.0000 mg | ORAL_TABLET | Freq: Every day | ORAL | Status: DC
  Administered 2011-03-14: 40 mg via ORAL
  Filled 2011-03-13 (×2): qty 1

## 2011-03-13 NOTE — H&P (Signed)
PCP:   Eustaquio Boyden, MD, MD  Possibly his prior PCP Pt states Dr. Hetty Ely is PCP  Chief Complaint:  Clamminess, diaphoresis, paleness  HPI: 60yoM with h/o DM (last A1c 6.7 in 09/2010), HL presents with vagal type symptoms of nausea,  clamminess, paleness, diaphoresis x2 today.   While eating lunch with friends this afternoon the pt reports he developed sudden nausea, but no  abdominal pain or vomiting, which then progressed to sensation of diffuse clamminess, paleness,  and forehead diahporesis, but no chest pain, SOB, dizziness, vision or hearing changes,  tingling/numbness, or focal neuro deficits. A waitress brought a cool rag and he laid his head  down but didn't improve so they left the restaurant. On the way to urgent care, he felt better in  the cold 35 degree air, and rolled the window down in the car to make himself feel better. He was  still symtpomatic through his evaluation at urgent care, he was referred to ED. He started feeling  better when being loaded in the ambulance, without any clear intervention.   In the ED, vitals stable, except that around 530p, the pt was noted to have the sensations again,  feeling poorly and clammy, his HR was noted to be in the 40's and he was seen to be pale. BS was  checked and 152. This episode lasted 15 mins and resolved without any clear intervention, although  he started getting some more IVF's at that time.   Labs with normal chemistry, LFT's, CBC. Trop POC negative x2. CXR negative. Dr. Daleen Squibb was consulted  and recommended monitoring in a step down unit and cardiology to consult. Pt was given 324 ASA and  IVF's.   He again denies any SOB, chest pain, dizziness, neuro symptoms through all this. He has been  feeling well up until today, has had good PO intake. He denies any leg swelling or erythema, long  plane/car rides recently, alhtough he did have a 5 hr car ride to [place] last week. He denies  any active cancers, and is  fully ambulatory and active. ROS is otherwise negative.   Past Medical History  Diagnosis Date  . Diabetes mellitus type II 10/20/1995  . Hyperlipidemia 02/20/1996   History reviewed. No pertinent past surgical history.  Medications:  HOME MEDS: Reconciled with pt  Prior to Admission medications   Medication Sig Start Date End Date Taking? Authorizing Provider  aspirin EC 325 MG tablet Take 325 mg by mouth daily.   Yes Historical Provider, MD  Cyanocobalamin (VITAMIN B-12 CR) 1000 MCG TBCR Take 1 tablet by mouth daily.    Yes Historical Provider, MD  glipiZIDE (GLUCOTROL) 10 MG tablet Take one in the morning and one half at bedtime 04/10/10 04/10/11 Yes Laurita Quint, MD  glucose blood (CHOICE DM FORA G20 TEST STRIPS) test strip 1 each by Other route daily as needed. Patient's choice    Yes Historical Provider, MD  glucose blood (FREESTYLE LITE) test strip Check daily and as needed.  250.00. 03/04/11  Yes Eustaquio Boyden, MD  Lancets MISC Use to check blood sugar daily    Yes Historical Provider, MD  lisinopril (PRINIVIL,ZESTRIL) 2.5 MG tablet Take 2.5 mg by mouth daily.     Yes Historical Provider, MD  metFORMIN (GLUCOPHAGE) 1000 MG tablet Take 1,000 mg by mouth 2 (two) times daily.     Yes Historical Provider, MD  pioglitazone (ACTOS) 45 MG tablet Take 45 mg by mouth daily.     Yes Historical  Provider, MD  simvastatin (ZOCOR) 40 MG tablet Take 40 mg by mouth at bedtime.     Yes Historical Provider, MD  vitamin C (ASCORBIC ACID) 500 MG tablet Take 500 mg by mouth daily.     Yes Historical Provider, MD    Allergies:  Allergies  Allergen Reactions  . Penicillins     REACTION: tingling, nausea, dizzyness    Social History:   reports that he quit smoking about 25 years ago. His smoking use included Cigarettes. He has a 9 pack-year smoking history. He does not have any smokeless tobacco history on file. He reports that he does not drink alcohol or use illicit drugs. Married lives wife.  3 children, 1 out of home, 1 grandchild. Exercises 2-3 times per week    Family History: Family History  Problem Relation Age of Onset  . Hypertension Mother   . Diabetes Mother   . Stroke Mother   . Kidney disease Mother     failure-dialysis  . Heart disease Mother     MI  . Heart disease Other     CHF, CABG  . Cancer Other     lung    Physical Exam: Filed Vitals:   03/13/11 1930 03/13/11 1945 03/13/11 2000 03/13/11 2015  BP: 118/49 125/53 115/43 126/58  Pulse: 96 98 96 98  Temp:      TempSrc:      Resp: 21 22 20 21   SpO2: 98% 97% 96% 97%   Blood pressure 126/58, pulse 98, temperature 98.2 F (36.8 C), temperature source Oral, resp. rate 21, SpO2 97.00%.  Gen: Healthy appearing middle aged M in no distress, sitting at ED bedside eating hospital food.  No distress, breathing comfortably, able to relate history easily, no increased WOB, not pale HEENT: Pupils round, reactive, normal appearing, EOMI, sclera clear and normal. Mouth normal  appearing, not really dry  Neck: Normal appearing, no carotid bruits bilaterally Lungs: CTAB no w/c/r, normal air movement, normal exam Heart: Tachycardic but regular, with clear S1/2 however at LLSB there is clear splitting of S2 (vs  S3, hard to tell), no change with respiration. No murmurs Abd: Soft, not obese, not tender or distended, no facial grimacing Extrem: Good normal bulk and tone, warm, perfusing normally, no BLE edema, erythema, normal exam  Neuro: Alert, attentive, conversant, pleasant, CN2-12 intact without droop or slurring, moving  extremities spontaneously, grossly non-focal throughout    Labs & Imaging Results for orders placed during the hospital encounter of 03/13/11 (from the past 48 hour(s))  CBC     Status: Abnormal   Collection Time   03/13/11  3:48 PM      Component Value Range Comment   WBC 6.8  4.0 - 10.5 (K/uL)    RBC 4.28  4.22 - 5.81 (MIL/uL)    Hemoglobin 14.0  13.0 - 17.0 (g/dL)    HCT 16.1  09.6 -  04.5 (%)    MCV 94.9  78.0 - 100.0 (fL)    MCH 32.7  26.0 - 34.0 (pg)    MCHC 34.5  30.0 - 36.0 (g/dL)    RDW 40.9  81.1 - 91.4 (%)    Platelets 127 (*) 150 - 400 (K/uL)   DIFFERENTIAL     Status: Abnormal   Collection Time   03/13/11  3:48 PM      Component Value Range Comment   Neutrophils Relative 91 (*) 43 - 77 (%)    Neutro Abs 6.2  1.7 - 7.7 (K/uL)  Lymphocytes Relative 2 (*) 12 - 46 (%)    Lymphs Abs 0.2 (*) 0.7 - 4.0 (K/uL)    Monocytes Relative 6  3 - 12 (%)    Monocytes Absolute 0.4  0.1 - 1.0 (K/uL)    Eosinophils Relative 1  0 - 5 (%)    Eosinophils Absolute 0.1  0.0 - 0.7 (K/uL)    Basophils Relative 0  0 - 1 (%)    Basophils Absolute 0.0  0.0 - 0.1 (K/uL)   COMPREHENSIVE METABOLIC PANEL     Status: Abnormal   Collection Time   03/13/11  3:48 PM      Component Value Range Comment   Sodium 139  135 - 145 (mEq/L)    Potassium 4.1  3.5 - 5.1 (mEq/L)    Chloride 104  96 - 112 (mEq/L)    CO2 27  19 - 32 (mEq/L)    Glucose, Bld 168 (*) 70 - 99 (mg/dL)    BUN 20  6 - 23 (mg/dL)    Creatinine, Ser 1.61  0.50 - 1.35 (mg/dL)    Calcium 9.1  8.4 - 10.5 (mg/dL)    Total Protein 6.6  6.0 - 8.3 (g/dL)    Albumin 3.9  3.5 - 5.2 (g/dL)    AST 18  0 - 37 (U/L)    ALT 18  0 - 53 (U/L)    Alkaline Phosphatase 51  39 - 117 (U/L)    Total Bilirubin 0.7  0.3 - 1.2 (mg/dL)    GFR calc non Af Amer 87 (*) >90 (mL/min)    GFR calc Af Amer >90  >90 (mL/min)   POCT I-STAT TROPONIN I     Status: Normal   Collection Time   03/13/11  4:07 PM      Component Value Range Comment   Troponin i, poc 0.00  0.00 - 0.08 (ng/mL)    Comment 3            GLUCOSE, CAPILLARY     Status: Abnormal   Collection Time   03/13/11  5:32 PM      Component Value Range Comment   Glucose-Capillary 152 (*) 70 - 99 (mg/dL)    Comment 1 Notify RN      Comment 2 Documented in Chart     POCT I-STAT TROPONIN I     Status: Normal   Collection Time   03/13/11  7:37 PM      Component Value Range Comment   Troponin  i, poc 0.00  0.00 - 0.08 (ng/mL)    Comment 3             Dg Chest Portable 1 View  03/13/2011  *RADIOLOGY REPORT*  Clinical Data: Weakness and nausea.  PORTABLE CHEST - 1 VIEW  Comparison: None.  Findings: The heart size is normal.  The lungs are clear.  The visualized soft tissues and bony thorax are unremarkable.  IMPRESSION: Negative chest.  Original Report Authenticated By: Jamesetta Orleans. MATTERN, M.D.   ECG's: The first ECG rhythm strip, lead II alone, from 237p shows NSR with Q wave and a possible STE  with downward concavity, it actually does appear worrisome. Next are EMS strips and ECG presumably  from urgent care with Q waves in III, aVF but no ST deviations and otherwise normal with normal  TW.  ECG from [facility] shows Q wave in III alone, and R wave in aVF, and again no ST deviations and TW's  normal.  Telemetry  strip ~530p has a poor baseline, but does show a sinus brady down to the 40-50's,  apparently during symptoms.  ED monitor while I interview him is sinus tachy in the 100's.   Impression Present on Admission:  .DIABETES MELLITUS, TYPE II .HYPERLIPIDEMIA .HYPERTENSION, BENIGN ESSENTIAL  60yoM with h/o DM (last A1c 6.7 in 09/2010), HL presents with vagal type symptoms of nausea,  clamminess, paleness, diaphoresis x2 today.   1. Vagal symptoms: So far the biggest thing jumping out is the initial rhythm strip with an  isolated lead showing Q wave and small amt of downwardly concave ST elevation, however STE has not  borne out in all subsequent 12 leads, although there is persistent Q wave in III alone (and no  prior for comparison). He did have bradycardia while symptomatic in the ED which could signify a  sick sinus node, however bradycardia occurs during benign vagal episodes as well.   Final thought is that he has resting sinus tachycardia to the 100's while just sitting there  talking to me, so PE is a consideration. However, he's not hypoxic on RA and has absolutely  no  cardiopulmonary symptoms, risk factors for PE.   - Admit SDU and monitor telemetry, pt advised to alert [name] for symptoms. Rule out MI with enzymes,  trend ECG. 2D echo in the am. IVF's overnight. Orhtostatics. Continue ASA 325 - Get Ddimer and if positive I've explained to pt that we'd have to proceed with CTA. Albeit low  likelihood, I can't fully explain his symptoms or resting tachy. - Cardiology consulted from the ED, appreciated.   2. Diabetes: holding oral hypoglycemics, cover with sensitive SSI  3. HTN: continue home lisinopril, statin  SDU, MC team 2 Full code, discussed   Other plans as per orders.   Goble Fudala 03/13/2011, 9:39 PM

## 2011-03-13 NOTE — ED Notes (Signed)
Patient denies pain and is resting comfortably.  

## 2011-03-13 NOTE — Progress Notes (Signed)
  Subjective:    Patient ID: Eduardo Pierce, male    DOB: November 29, 1950, 61 y.o.   MRN: [id number]  HPI patient was in his usual state of health until today while eating lunch he had an episode where he became nauseated lightheaded. He began to sweat. He had to lay his head down for 2-5 minutes to keep from fainting. He denies any chest pain or palpitations with the episode.    Review of Systems pertinent history this patient has diabetes high blood pressure and high cholesterol. He has no history of heart disease.     Objective:   Physical Exam physical exam reveals an alert cooperative male who is not in any acute distress his color is good his chest is clear his heart was regular rate without murmurs his abdomen was soft without tenderness. Orthostatic testing was performed blood pressure lying was 115/73 with a pulse of 86 standing 121/74 with a pulse of 94.  EKG was performed and showed small Q waves in II, III, and F aVF.      Assessment & Plan:  Assessment is that of a near syncopal episode in a patient with significant cardiac risk factors which include age high cholesterol diabetes and hypertension. He is a nonsmoker he exercises regular.

## 2011-03-13 NOTE — ED Notes (Signed)
Pt has an episode of lightheadedness HR drops down to 40s unable to capture rhythm. Dr Lynelle Doctor called to bs. Pt diaphoretic. Heart rate increased to 70s shortly there after in a sinus rhythm.

## 2011-03-13 NOTE — ED Provider Notes (Cosign Needed)
History     CSN: [id number]  Arrival date & time 03/13/11  1458   First MD Initiated Contact with Patient 03/13/11 1520      Chief Complaint  Patient presents with  . Weakness    (Consider location/radiation/quality/duration/timing/severity/associated sxs/prior treatment) HPI  Patient relates he ate his usual breakfast today. He states he felt fine and went to lunch at the usual time. While sitting at the table to 8 lunch he started feeling like he had upset stomach and started feeling nauseated. He was given a cool compress by the wait staff and kept his head down for about 2 minutes however he did not feel better when he pretty sat up his friend states he looked extremely pale. He denies any diaphoresis or clamminess but felt like he might become clammy. He relates his friends took him to urgent care and when he went outside in the cold winter air he states he started feeling better. He states he felt bad for about 10 minutes and then slowly got better over the next 30-40 minutes. He went to an urgent care where his CBG was 150. They did EKG on him and told him he had a Q wave t on his EKG they were concerned about and he presents to the emergency department via EMS. He denies chest pain, shortness of breath, abdominal pain,, headache, and states his nausea is gone now. He denies being around anybody he knows is sick. He denies any other precipitating events. He states he's never felt this way before.  PCP Dr. Hetty Ely at [facility] and Dr. Sharen Hones  Past Medical History  Diagnosis Date  . Diabetes mellitus type II 10/20/1995  . Hyperlipidemia 02/20/1996    History reviewed. No pertinent past surgical history.  Family History  Problem Relation Age of Onset  . Hypertension Mother   . Diabetes Mother   . Stroke Mother   . Kidney disease Mother     failure-dialysis  . Heart disease Mother     MI  . Heart disease Other     CHF, CABG  . Cancer Other     lung   father  of patient died from sudden death felt to be from arrhythmia Maternal aunt died with sudden death Paternal grandmother had some type of heart problem and heart surgeries  History  Substance Use Topics  . Smoking status: Former Smoker -- 0.5 packs/day for 18 years    Types: Cigarettes    Quit date: 01/19/1986  . Smokeless tobacco: Not on file  . Alcohol Use: No   employed Lives with spouse    Review of Systems  All other systems reviewed and are negative.    Allergies  Penicillins  Home Medications   Current Outpatient Rx  Name Route Sig Dispense Refill  . ASPIRIN EC 325 MG PO TBEC Oral Take 325 mg by mouth daily.    Marland Kitchen VITAMIN B-12 ER 1000 MCG PO TBCR Oral Take 1 tablet by mouth daily.     Marland Kitchen GLIPIZIDE 10 MG PO TABS  Take one in the morning and one half at bedtime 135 tablet 3  . GLUCOSE BLOOD VI STRP Other 1 each by Other route daily as needed. Patient's choice     . GLUCOSE BLOOD VI STRP  Check daily and as needed.  250.00. 50 each 11  . LANCETS MISC  Use to check blood sugar daily     . LISINOPRIL 2.5 MG PO TABS Oral Take 2.5 mg by  mouth daily.      Marland Kitchen METFORMIN HCL 1000 MG PO TABS Oral Take 1,000 mg by mouth 2 (two) times daily.      Marland Kitchen PIOGLITAZONE HCL 45 MG PO TABS Oral Take 45 mg by mouth daily.      Marland Kitchen SIMVASTATIN 40 MG PO TABS Oral Take 40 mg by mouth at bedtime.      Marland Kitchen VITAMIN C 500 MG PO TABS Oral Take 500 mg by mouth daily.        BP 120/5  Pulse 88  Temp(Src) 98.2 F (36.8 C) (Oral)  Resp 22  SpO2 99%  Vital signs normal    Physical Exam  Nursing note and vitals reviewed. Constitutional: He is oriented to person, place, and time. He appears well-developed and well-nourished.  Non-toxic appearance. He does not appear ill. No distress.  HENT:  Head: Normocephalic and atraumatic.  Right Ear: External ear normal.  Left Ear: External ear normal.  Nose: Nose normal. No mucosal edema or rhinorrhea.  Mouth/Throat: Oropharynx is clear and moist and mucous  membranes are normal. No dental abscesses or uvula swelling.  Eyes: Conjunctivae and EOM are normal. Pupils are equal, round, and reactive to light.  Neck: Normal range of motion and full passive range of motion without pain. Neck supple.  Cardiovascular: Normal rate, regular rhythm and normal heart sounds.  Exam reveals no gallop and no friction rub.   No murmur heard. Pulmonary/Chest: Effort normal and breath sounds normal. No respiratory distress. He has no wheezes. He has no rhonchi. He has no rales. He exhibits no tenderness and no crepitus.  Abdominal: Soft. Normal appearance and bowel sounds are normal. He exhibits no distension. There is no tenderness. There is no rebound and no guarding.  Musculoskeletal: Normal range of motion. He exhibits no edema and no tenderness.       Moves all extremities well.   Neurological: He is alert and oriented to person, place, and time. He has normal strength. No cranial nerve deficit.  Skin: Skin is warm, dry and intact. No rash noted. No erythema. No pallor.  Psychiatric: He has a normal mood and affect. His speech is normal and behavior is normal. His mood appears not anxious.    ED Course  Procedures (including critical care time)  17:25 called to room. Pt started feeling bad and called nurse. States he "felt like I am going to get clammy", felt lightheaded, HR in 40's with first BP of 102/60. Pt very pale, denies feeling nauseaated. CBG 152. Strips reviewed and patient appeared to become bradycardic.   18:10 Recheck, patient now has normal color, states he is feeling fine. HR 92. Waiting for second troponin.   19:37 Dr Daleen Squibb, have medicine admit and they will consult, will need stress test if he rules out for MI, Suggests Step down bed.   20:30 Dr Angus Palms accepts in admission to step down, team 2  Results for orders placed during the hospital encounter of 03/13/11  CBC      Component Value Range   WBC 6.8  4.0 - 10.5 (K/uL)   RBC 4.28  4.22 -  5.81 (MIL/uL)   Hemoglobin 14.0  13.0 - 17.0 (g/dL)   HCT 11.9  14.7 - 82.9 (%)   MCV 94.9  78.0 - 100.0 (fL)   MCH 32.7  26.0 - 34.0 (pg)   MCHC 34.5  30.0 - 36.0 (g/dL)   RDW 56.2  13.0 - 86.5 (%)   Platelets 127 (*) 150 -  400 (K/uL)  DIFFERENTIAL      Component Value Range   Neutrophils Relative 91 (*) 43 - 77 (%)   Neutro Abs 6.2  1.7 - 7.7 (K/uL)   Lymphocytes Relative 2 (*) 12 - 46 (%)   Lymphs Abs 0.2 (*) 0.7 - 4.0 (K/uL)   Monocytes Relative 6  3 - 12 (%)   Monocytes Absolute 0.4  0.1 - 1.0 (K/uL)   Eosinophils Relative 1  0 - 5 (%)   Eosinophils Absolute 0.1  0.0 - 0.7 (K/uL)   Basophils Relative 0  0 - 1 (%)   Basophils Absolute 0.0  0.0 - 0.1 (K/uL)  COMPREHENSIVE METABOLIC PANEL      Component Value Range   Sodium 139  135 - 145 (mEq/L)   Potassium 4.1  3.5 - 5.1 (mEq/L)   Chloride 104  96 - 112 (mEq/L)   CO2 27  19 - 32 (mEq/L)   Glucose, Bld 168 (*) 70 - 99 (mg/dL)   BUN 20  6 - 23 (mg/dL)   Creatinine, Ser 1.61  0.50 - 1.35 (mg/dL)   Calcium 9.1  8.4 - 09.6 (mg/dL)   Total Protein 6.6  6.0 - 8.3 (g/dL)   Albumin 3.9  3.5 - 5.2 (g/dL)   AST 18  0 - 37 (U/L)   ALT 18  0 - 53 (U/L)   Alkaline Phosphatase 51  39 - 117 (U/L)   Total Bilirubin 0.7  0.3 - 1.2 (mg/dL)   GFR calc non Af Amer 87 (*) >90 (mL/min)   GFR calc Af Amer >90  >90 (mL/min)  POCT I-STAT TROPONIN I      Component Value Range   Troponin i, poc 0.00  0.00 - 0.08 (ng/mL)   Comment 3           GLUCOSE, CAPILLARY      Component Value Range   Glucose-Capillary 152 (*) 70 - 99 (mg/dL)   Comment 1 Notify RN     Comment 2 Documented in Chart    POCT I-STAT TROPONIN I      Component Value Range   Troponin i, poc 0.00  0.00 - 0.08 (ng/mL)   Comment 3            Laboratory interpretation all normal except hyperglycemia    Dg Chest Portable 1 View  03/13/2011  *RADIOLOGY REPORT*  Clinical Data: Weakness and nausea.  PORTABLE CHEST - 1 VIEW  Comparison: None.  Findings: The heart size is normal.   The lungs are clear.  The visualized soft tissues and bony thorax are unremarkable.  IMPRESSION: Negative chest.  Original Report Authenticated By: Jamesetta Orleans. MATTERN, M.D.     Date: 03/13/2011  Rate: 88  Rhythm: normal sinus rhythm  QRS Axis: normal  Intervals: normal  ST/T Wave abnormalities: normal  Conduction Disutrbances:none  Narrative Interpretation: q wave in lead III  Old EKG Reviewed: none available  #2 shortly after had episode in ED  Date: 03/13/2011  Rate: 74  Rhythm: normal sinus rhythm and sinus arrhythmia  QRS Axis: normal  Intervals: normal  ST/T Wave abnormalities: normal  Conduction Disutrbances:none  Narrative Interpretation:   Old EKG Reviewed: unchanged   Diagnoses that have been ruled out:  None  Diagnoses that are still under consideration:  None  Final diagnoses:  Near syncope   Plan admission  Devoria Albe, MD, FACEP   CRITICAL CARE Performed by: Devoria Albe L   Total critical care time: 30 minutes  Critical  care time was exclusive of separately billable procedures and treating other patients.  Critical care was necessary to treat or prevent imminent or life-threatening deterioration.  Critical care was time spent personally by me on the following activities: development of treatment plan with patient and/or surrogate as well as nursing, discussions with consultants, evaluation of patient's response to treatment, examination of patient, obtaining history from patient or surrogate, ordering and performing treatments and interventions, ordering and review of laboratory studies, ordering and review of radiographic studies, pulse oximetry and re-evaluation of patient's condition.    MDM          Ward Givens, MD 03/13/11 2045

## 2011-03-13 NOTE — ED Notes (Addendum)
Pt states he became had an episode of weakness and nausea then became pale. Pt went to urgent care and transferred her for eval  Denies pain,fever or vomiting.

## 2011-03-14 ENCOUNTER — Other Ambulatory Visit: Payer: Self-pay

## 2011-03-14 DIAGNOSIS — R9431 Abnormal electrocardiogram [ECG] [EKG]: Secondary | ICD-10-CM

## 2011-03-14 DIAGNOSIS — R42 Dizziness and giddiness: Secondary | ICD-10-CM

## 2011-03-14 LAB — GLUCOSE, CAPILLARY
Glucose-Capillary: 207 mg/dL — ABNORMAL HIGH (ref 70–99)
Glucose-Capillary: 165 mg/dL — ABNORMAL HIGH (ref 70–99)
Glucose-Capillary: 209 mg/dL — ABNORMAL HIGH (ref 70–99)
Glucose-Capillary: 175 mg/dL — ABNORMAL HIGH (ref 70–99)

## 2011-03-14 LAB — D-DIMER, QUANTITATIVE: D-Dimer, Quant: <0.22 ug/mL-FEU (ref 0.00–0.48)

## 2011-03-14 LAB — CARDIAC PANEL(CRET KIN+CKTOT+MB+TROPI)
CK, MB: 2.5 ng/mL (ref 0.3–4.0)
Total CK: 107 U/L (ref 7–232)

## 2011-03-14 LAB — MRSA PCR SCREENING: MRSA by PCR: NEGATIVE

## 2011-03-14 NOTE — Progress Notes (Signed)
  Echocardiogram 2D Echocardiogram has been performed.  Taj Nevins, Real Cons 03/14/2011, 10:49 AM

## 2011-03-14 NOTE — Progress Notes (Signed)
Patient ID: Eduardo Pierce, male   DOB: 09-15-1950, 61 y.o.   MRN: [id number] Subjective: No events overnight. Patient denies chest pain, shortness of breath, abdominal pain.   Objective:  Vital signs in last 24 hours:  Filed Vitals:   03/14/11 0900 03/14/11 1000 03/14/11 1208 03/14/11 1520  BP: 122/59 123/56 116/59 129/74  Pulse: 92 94 89 90  Temp:   98.7 F (37.1 C) 98.7 F (37.1 C)  TempSrc:   Oral Oral  Resp: 0 22 18 18   Height:      Weight:      SpO2: 95% 95% 97% 99%    Intake/Output from previous day:   Intake/Output Summary (Last 24 hours) at 03/14/11 1543 Last data filed at 03/14/11 1400  Gross per 24 hour  Intake   1690 ml  Output   1900 ml  Net   -210 ml    Physical Exam: General: Alert, awake, oriented x3, in no acute distress. HEENT: No bruits, no goiter. Moist mucous membranes, no scleral icterus, no conjunctival pallor. Heart: Regular rate and rhythm, S1/S2 +, no murmurs, rubs, gallops. Lungs: Clear to auscultation bilaterally. No wheezing, no rhonchi, no rales.  Abdomen: Soft, nontender, nondistended, positive bowel sounds. Extremities: No clubbing or cyanosis, no pitting edema,  positive pedal pulses. Neuro: Grossly nonfocal.  Lab Results:  Basic Metabolic Panel:    Component Value Date/Time   NA 139 03/13/2011 1548   K 4.1 03/13/2011 1548   CL 104 03/13/2011 1548   CO2 27 03/13/2011 1548   BUN 20 03/13/2011 1548   CREATININE 0.99 03/13/2011 1548   GLUCOSE 168* 03/13/2011 1548   CALCIUM 9.1 03/13/2011 1548   CBC:    Component Value Date/Time   WBC 6.8 03/13/2011 1548   HGB 14.0 03/13/2011 1548   HCT 40.6 03/13/2011 1548   PLT 127* 03/13/2011 1548   MCV 94.9 03/13/2011 1548   NEUTROABS 6.2 03/13/2011 1548   LYMPHSABS 0.2* 03/13/2011 1548   MONOABS 0.4 03/13/2011 1548   EOSABS 0.1 03/13/2011 1548   BASOSABS 0.0 03/13/2011 1548      Lab 03/13/11 1548  WBC 6.8  HGB 14.0  HCT 40.6  PLT 127*  MCV 94.9  MCH 32.7  MCHC 34.5  RDW 13.3  LYMPHSABS  0.2*  MONOABS 0.4  EOSABS 0.1  BASOSABS 0.0  BANDABS --    Lab 03/13/11 1548  NA 139  K 4.1  CL 104  CO2 27  GLUCOSE 168*  BUN 20  CREATININE 0.99  CALCIUM 9.1  MG --   No results found for this basename: INR:5,PROTIME:5 in the last 168 hours Cardiac markers:  Lab 03/14/11 1012 03/14/11 0131  CKMB 2.3 2.5  TROPONINI <0.30 <0.30  MYOGLOBIN -- --   No components found with this basename: POCBNP:3 Recent Results (from the past 240 hour(s))  MRSA PCR SCREENING     Status: Normal   Collection Time   03/13/11 10:39 PM      Component Value Range Status Comment   MRSA by PCR NEGATIVE  NEGATIVE  Final     Studies/Results: Dg Chest Portable 1 View  03/13/2011  *RADIOLOGY REPORT*  Clinical Data: Weakness and nausea.  PORTABLE CHEST - 1 VIEW  Comparison: None.  Findings: The heart size is normal.  The lungs are clear.  The visualized soft tissues and bony thorax are unremarkable.  IMPRESSION: Negative chest.  Original Report Authenticated By: Jamesetta Orleans. MATTERN, M.D.    Medications: Scheduled Meds:   . aspirin  EC  325 mg Oral Daily  . docusate sodium  100 mg Oral BID  . heparin  5,000 Units Subcutaneous Q8H  . lisinopril  2.5 mg Oral Daily  . senna  1 tablet Oral BID  . simvastatin  40 mg Oral QHS  . sodium chloride  3 mL Intravenous Q12H  . DISCONTD: sodium chloride   Intravenous Once  . DISCONTD: aspirin  324 mg Oral Once   Continuous Infusions:   . sodium chloride 1,000 mL (03/13/11 2244)   PRN Meds:.acetaminophen, acetaminophen, ondansetron (ZOFRAN) IV, ondansetron  Assessment/Plan:  Principal Problem  Q waves - identified on EKG strip - we will repeat EKG again for comparison - Patient is asymptomatic and cardiac enzymes are negative so far  - Awaiting cardiology consult  - Anticipate discharge either later today or tomorrow morning    EDUCATION - test results and diagnostic studies were discussed with patient and pt's family who was present at the  bedside - patient and family have verbalized the understanding - questions were answered at the bedside and contact information was provided for additional questions or concerns   LOS: 1 day   Ledonna Dormer 03/14/2011, 3:43 PM  [facility] Pager: [phone]

## 2011-03-14 NOTE — Progress Notes (Signed)
Pt had 3 beat run Vtach. Pt asymptomatic, visiting with family. MD notified. Will con't to monitor.

## 2011-03-14 NOTE — Consult Note (Signed)
Clinical Summary [name] is a 61 y.o.male admitted to the hospital following recent episodes of sudden onset nausea, lightheadedness, and diaphoresis. The first episode occurred yesterday while he was seated eating lunch with some friends. He indicates that he felt no specific chest pain or breathlessness at that time, no palpitations. He put his head down on the table, felt somewhat better when he got outside into the cool air, however was seen at the urgent care and referred to the ER for further assessment with continued symptoms. He had another episode in the ER with decrease in heart rate into the 40s by report, possibly a vasovagal event. At no point has he ever passed out, and he has no prior history of syncope.  At baseline he reports that he exercises 3 days a week at the [facility]. He walks on the treadmill for 35 minutes, denies any exertional chest pain or breathlessness. ECG is reviewed showing sinus rhythm with Q. in lead 3, nondiagnostic in aVF, no significant ST segment changes. Under observation his heart rate has been predominantly 90 to 100 in sinus rhythm and cardiac markers have been completely normal. D-dimer was also normal at less than 0.22. Echocardiogram report from earlier in the day indicates LVEF of 55-60% without regional wall motion abnormalities. Blood pressure measurements have not shown any significant hypotension.  He reports no sick contacts, no fevers or chills, no cough or hemoptysis. Stable appetite without significant reflux. No abdominal pain.   Allergies  Allergen Reactions  . Penicillins     REACTION: tingling, nausea, dizzyness    Medications    . aspirin EC  325 mg Oral Daily  . docusate sodium  100 mg Oral BID  . heparin  5,000 Units Subcutaneous Q8H  . lisinopril  2.5 mg Oral Daily  . senna  1 tablet Oral BID  . simvastatin  40 mg Oral QHS  . sodium chloride  3 mL Intravenous Q12H  . DISCONTD: sodium chloride   Intravenous Once  . DISCONTD:  aspirin  324 mg Oral Once    Past Medical History  Diagnosis Date  . Diabetes mellitus type II 10/20/1995  . Hyperlipidemia 02/20/1996    History reviewed. No pertinent past surgical history.  Family History  Problem Relation Age of Onset  . Hypertension Mother   . Diabetes Mother   . Stroke Mother   . Kidney disease Mother     failure-dialysis  . Heart disease Mother     MI  . Heart disease Other     CHF, CABG  . Cancer Other     lung    Social History [name] reports that he quit smoking about 25 years ago. His smoking use included Cigarettes. He has a 9 pack-year smoking history. He does not have any smokeless tobacco history on file. [name] reports that he does not drink alcohol.  Review of Systems Negative except as outlined above.  Physical Examination Temp:  [98.2 F (36.8 C)-99 F (37.2 C)] 98.7 F (37.1 C) (02/23 1520) Pulse Rate:  [82-110] 90  (02/23 1520) Resp:  [0-25] 18  (02/23 1520) BP: (85-139)/(40-74) 129/74 mmHg (02/23 1520) SpO2:  [95 %-99 %] 99 % (02/23 1520) Weight:  [168 lb 10.4 oz (76.5 kg)] 168 lb 10.4 oz (76.5 kg) (02/22 2230)  I/O last 3 completed shifts: In: 590 [P.O.:120; I.V.:470] Out: 1100 [Urine:1100]  Normally nourished appearing male in no acute distress. HEENT: Conjunctiva and lids normal, oropharynx clear with moist mucosa. Neck:  Supple, no elevated JVP or carotid bruits, no thyromegaly. Lungs: Clear to auscultation, nonlabored breathing at rest. Cardiac: Regular rate and rhythm, no S3 or significant systolic murmur, no pericardial rub. Soft S4. Abdomen: Soft, nontender, bowel sounds present, no guarding or rebound. Extremities: No pitting edema, distal pulses 2+. Skin: Warm and dry. Musculoskeletal: No kyphosis. Neuropsychiatric: Alert and oriented x3, affect grossly appropriate.   Testing Lab Results  Component Value Date   WBC 6.8 03/13/2011   HGB 14.0 03/13/2011   HCT 40.6 03/13/2011   MCV 94.9 03/13/2011    PLT 127* 03/13/2011    Lab Results  Component Value Date   CREATININE 0.99 03/13/2011   BUN 20 03/13/2011   NA 139 03/13/2011   K 4.1 03/13/2011   CL 104 03/13/2011   CO2 27 03/13/2011    Lab Results  Component Value Date   ALT 18 03/13/2011   AST 18 03/13/2011   ALKPHOS 51 03/13/2011   BILITOT 0.7 03/13/2011    Lab Results  Component Value Date   TSH 4.25 03/31/2010    Lab Results  Component Value Date   LDLCALC 91 03/31/2010    Lab Results  Component Value Date   CKTOTAL 94 03/14/2011   CKMB 2.3 03/14/2011   TROPONINI <0.30 03/14/2011    Impression  1. Recent episodes/spelled as outlined above. Symptoms included diaphoresis with nausea and lightheadedness. He had no sense of palpitations, chest pain or breathlessness, and had no syncope. He has a Q wave in lead 3 which may be a normal variant, particularly with normal cardiac markers and no wall motion abnormalities with normal LVEF by echocardiogram. His d-dimer is normal, arguing against pulmonary embolus. Heart rate has been up somewhat, and he has a soft S4 on examination. He reports no personal history of CAD or myocardial infarction. Chest x-ray shows no acute process. Events do sound like a possible vasovagal process, however etiology is not entirely clear at this point.  2. Long-standing type 2 diabetes mellitus on oral therapy.  3. Hyperlipidemia, on statin therapy as an outpatient.   Recommendations  Discussed in detail with the patient and his 3 daughters present. From a cardiac perspective, would continue telemetry monitoring. He has ruled out for myocardial infarction, and his LVEF is normal by recent echocardiogram. In light of his long-standing type 2 diabetes mellitus, I would suggest ischemic evaluation, and have ordered an exercise Myoview for tomorrow. Our service will follow with you, and can make further recommendations.   Jonelle Sidle, M.D., F.A.C.C.

## 2011-03-14 NOTE — Progress Notes (Signed)
Pt arrived to [id number] rm [id number].  Pt oriented to unit and hospital policies.  Telemetry applied, VSS with no complaints.  Will con't to monitor and carry out new orders.

## 2011-03-15 ENCOUNTER — Inpatient Hospital Stay (HOSPITAL_COMMUNITY): Payer: 59

## 2011-03-15 ENCOUNTER — Other Ambulatory Visit: Payer: Self-pay

## 2011-03-15 DIAGNOSIS — R55 Syncope and collapse: Secondary | ICD-10-CM

## 2011-03-15 LAB — CBC
HCT: 39.2 % (ref 39.0–52.0)
MCV: 94.2 fL (ref 78.0–100.0)
Platelets: 110 10*3/uL — ABNORMAL LOW (ref 150–400)
RBC: 4.16 MIL/uL — ABNORMAL LOW (ref 4.22–5.81)
RDW: 13.5 % (ref 11.5–15.5)
WBC: 3 10*3/uL — ABNORMAL LOW (ref 4.0–10.5)

## 2011-03-15 LAB — URINE DRUGS OF ABUSE SCREEN W ALC, ROUTINE (REF LAB)
Barbiturate Quant, Ur: NEGATIVE
Benzodiazepines.: NEGATIVE
Creatinine,U: 46.4 mg/dL
Marijuana Metabolite: NEGATIVE
Methadone: NEGATIVE
Phencyclidine (PCP): NEGATIVE

## 2011-03-15 LAB — BASIC METABOLIC PANEL
CO2: 26 mEq/L (ref 19–32)
Chloride: 106 mEq/L (ref 96–112)
Creatinine, Ser: 1.01 mg/dL (ref 0.50–1.35)
GFR calc Af Amer: >90 mL/min (ref >90)
Potassium: 4 mEq/L (ref 3.5–5.1)

## 2011-03-15 LAB — GLUCOSE, CAPILLARY
Glucose-Capillary: 171 mg/dL — ABNORMAL HIGH (ref 70–99)
Glucose-Capillary: 208 mg/dL — ABNORMAL HIGH (ref 70–99)

## 2011-03-15 MED ORDER — TECHNETIUM TC 99M TETROFOSMIN IV KIT
30.0000 | PACK | Freq: Once | INTRAVENOUS | Status: AC | PRN
  Administered 2011-03-15: 30 via INTRAVENOUS

## 2011-03-15 MED ORDER — TECHNETIUM TC 99M TETROFOSMIN IV KIT
10.0000 | PACK | Freq: Once | INTRAVENOUS | Status: AC | PRN
  Administered 2011-03-15: 10 via INTRAVENOUS

## 2011-03-15 MED ORDER — METOPROLOL TARTRATE 25 MG PO TABS: 25.0000 mg | ORAL_TABLET | Freq: Two times a day (BID) | ORAL | Status: DC

## 2011-03-15 NOTE — Discharge Instructions (Signed)
Chest Pain (Nonspecific) It is often hard to give a specific diagnosis for the cause of chest pain. There is always a chance that your pain could be related to something serious, such as a heart attack or a blood clot in the lungs. You need to follow up with your caregiver for further evaluation. CAUSES   Heartburn.   Pneumonia or bronchitis.   Anxiety and stress.   Inflammation around your heart (pericarditis) or lung (pleuritis or pleurisy).   A blood clot in the lung.   A collapsed lung (pneumothorax). It can develop suddenly on its own (spontaneous pneumothorax) or from injury (trauma) to the chest.  The chest wall is composed of bones, muscles, and cartilage. Any of these can be the source of the pain.  The bones can be bruised by injury.   The muscles or cartilage can be strained by coughing or overwork.   The cartilage can be affected by inflammation and become sore (costochondritis).  DIAGNOSIS  Lab tests or other studies, such as X-rays, an EKG, stress testing, or cardiac imaging, may be needed to find the cause of your pain.  TREATMENT   Treatment depends on what may be causing your chest pain. Treatment may include:   Acid blockers for heartburn.   Anti-inflammatory medicine.   Pain medicine for inflammatory conditions.   Antibiotics if an infection is present.   You may be advised to change lifestyle habits. This includes stopping smoking and avoiding caffeine and chocolate.   You may be advised to keep your head raised (elevated) when sleeping. This reduces the chance of acid going backward from your stomach into your esophagus.   Most of the time, nonspecific chest pain will improve within 2 to 3 days with rest and mild pain medicine.  HOME CARE INSTRUCTIONS   If antibiotics were prescribed, take the full amount even if you start to feel better.   For the next few days, avoid physical activities that bring on chest pain. Continue physical activities as  directed.   Do not smoke cigarettes or drink alcohol until your symptoms are gone.   Only take over-the-counter or prescription medicine for pain, discomfort, or fever as directed by your caregiver.   Follow your caregiver's suggestions for further testing if your chest pain does not go away.   Keep any follow-up appointments you made. If you do not go to an appointment, you could develop lasting (chronic) problems with pain. If there is any problem keeping an appointment, you must call to reschedule.  SEEK MEDICAL CARE IF:   You think you are having problems from the medicine you are taking. Read your medicine instructions carefully.   Your chest pain does not go away, even after treatment.   You develop a rash with blisters on your chest.  SEEK IMMEDIATE MEDICAL CARE IF:   You have increased chest pain or pain that spreads to your arm, neck, jaw, back, or belly (abdomen).   You develop shortness of breath, an increasing cough, or you are coughing up blood.   You have severe back or abdominal pain, feel sick to your stomach (nauseous) or throw up (vomit).   You develop severe weakness, fainting, or chills.   You have an oral temperature above 102 F (38.9 C), not controlled by medicine.  THIS IS AN EMERGENCY. Do not wait to see if the pain will go away. Get medical help at once. Call your local emergency services (911 in [place] Do not drive yourself to   the hospital. MAKE SURE YOU:   Understand these instructions.   Will watch your condition.   Will get help right away if you are not doing well or get worse.  Document Released: 10/15/2004 Document Revised: 09/17/2010 Document Reviewed: 08/11/2007 [employer] Patient Information 2012 [employer]. 

## 2011-03-15 NOTE — Progress Notes (Signed)
Subjective:    Mr. Peavy was admitted for presyncope while having lunch.  He has ruled out for MI.  Normal LVEF per echo 2/23.  Tele with NSVT.  Examined him in the nuclear stress room prior to stress Myoview.  No further episodes.  Feels fine, denies CP/SOB.    Objective:    Vital Signs:   Temp:  [98.6 F (37 C)-99.4 F (37.4 C)] 99.4 F (37.4 C) (02/24 0548) Pulse Rate:  [86-94] 94  (02/24 0548) Resp:  [17-20] 20  (02/24 0548) BP: (116-133)/(48-74) 124/48 mmHg (02/24 1047) SpO2:  [96 %-99 %] 96 % (02/24 0548) Weight:  [168 lb 6.9 oz (76.4 kg)] 168 lb 6.9 oz (76.4 kg) (02/24 0548) Last BM Date: 03/15/11  24-hour weight change: Weight change: -3.5 oz (-0.1 kg)  Intake/Output:   Intake/Output Summary (Last 24 hours) at 03/15/11 1136 Last data filed at 03/15/11 0600  Gross per 24 hour  Intake    803 ml  Output   1900 ml  Net  -1097 ml     Physical Exam: General:  Well appearing. No resp difficulty HEENT: normal Neck: supple. JVP . Carotids 2+ bilat; no bruits. No lymphadenopathy or thryomegaly appreciated. Cor: PMI nondisplaced. Regular rate & rhythm. No rubs, gallops or murmurs. Lungs: clear Abdomen: soft, nontender, nondistended. No hepatosplenomegaly. No bruits or masses. Good bowel sounds. Extremities: no cyanosis, clubbing, rash, edema Neuro: alert & orientedx3, cranial nerves grossly intact. moves all 4 extremities w/o difficulty. Affect pleasant  Telemetry: SR with occ PVCs  Labs: Basic Metabolic Panel:  Lab 03/15/11 1610 03/13/11 1548  NA 140 139  K 4.0 4.1  CL 106 104  CO2 26 27  GLUCOSE 161* 168*  BUN 12 20  CREATININE 1.01 0.99  CALCIUM 8.7 9.1  MG -- --  PHOS -- --    Liver Function Tests:  Lab 03/13/11 1548  AST 18  ALT 18  ALKPHOS 51  BILITOT 0.7  PROT 6.6  ALBUMIN 3.9   No results found for this basename: LIPASE:5,AMYLASE:5 in the last 168 hours No results found for this basename: AMMONIA:3 in the last 168  hours  CBC:  Lab 03/15/11 0750 03/13/11 1548  WBC 3.0* 6.8  NEUTROABS -- 6.2  HGB 13.6 14.0  HCT 39.2 40.6  MCV 94.2 94.9  PLT 110* 127*    Cardiac Enzymes:  Lab 03/14/11 1809 03/14/11 1012 03/14/11 0131  CKTOTAL 109 94 107  CKMB 2.2 2.3 2.5  CKMBINDEX -- -- --  TROPONINI <0.30 <0.30 <0.30    BNP: No components found with this basename: POCBNP:5  CBG:  Lab 03/15/11 1111 03/15/11 0615 03/14/11 2157 03/14/11 1559 03/14/11 1217  GLUCAP 208* 171* 175* 209* 165*    Coagulation Studies: No results found for this basename: LABPROT:5,INR:5 in the last 72 hours  Other results: EKG: NSR without acute changes   Medications:     Scheduled Medications:    . aspirin EC  325 mg Oral Daily  . docusate sodium  100 mg Oral BID  . heparin  5,000 Units Subcutaneous Q8H  . lisinopril  2.5 mg Oral Daily  . senna  1 tablet Oral BID  . simvastatin  40 mg Oral QHS  . sodium chloride  3 mL Intravenous Q12H     Infusions:    . sodium chloride 1,000 mL (03/13/11 2244)     PRN Medications:  acetaminophen, acetaminophen, ondansetron (ZOFRAN) IV, ondansetron, technetium tetrofosmin, technetium tetrofosmin   Assessment:   1.  Presyncope possibly vasovagal.     - Myoview without evidence of ischema     - LVEF 68% 2. DM, type II 3. Hyperlipidemia  Plan/Discussion:    Myoview without evidence of ischemia.  No further work up from a cardiac standpoint.  Believe this could be vasovagal in nature.  Ok for discharge from cardiac standpoint.  Consider adding low dose beta blocker with PVCs.    Length of Stay: 2   Ulyess Blossom, PA-C  03/15/2011, 11:36 AM   Attending Note:   The patient was seen and examined.  Agree with assessment and plan as noted above.  Results of myoview reviewed.  No evidene of ischemia.  Normal LV function.  Discussed work up so far.  Also discussed what else we can do if he any other episodes of pre-syncope including event monitor.  He may need  to see his medical doctor or neuro.  Feeling well.  Home today.   Vesta Mixer, Montez Hageman., MD, [facility] 03/15/2011, 12:43 PM

## 2011-03-15 NOTE — Discharge Summary (Signed)
Patient ID: Eduardo Pierce MRN: [id number] DOB/AGE: 62-May-1952 62 y.o.  Admit date: 03/13/2011 Discharge date: 03/15/2011  Primary Care Physician:  Eustaquio Boyden, MD, MD  Assessment/Plan:   Principal Problem   Q waves  - identified on EKG strip  - Patient is asymptomatic and cardiac enzymes are negative so far  - Myoview - no findings of ischemia; as per cardiology patient would benefit from low dose Beta blocker; I will discharge the patient with metoprolol 25 mg BID; he has verbalized the understanding of potential side effect of metoprolol included but not limited to bradycardia and the need to follow up with PCP in no more than a week to make sure his HR remains within the normal range  EDUCATION  - test results and diagnostic studies were discussed with patient and pt's family who was present at the bedside  - patient and family have verbalized the understanding  - questions were answered at the bedside and contact information was provided for additional questions or concerns      Medication List  As of 03/15/2011  1:10 PM   TAKE these medications         ACTOS 45 MG tablet   Generic drug: pioglitazone   Take 45 mg by mouth daily.      aspirin EC 325 MG tablet   Take 325 mg by mouth daily.      CHOICE DM FORA G20 TEST STRIPS test strip   Generic drug: glucose blood   1 each by Other route daily as needed. Patient's choice      glucose blood test strip   Check daily and as needed.  250.00.      glipiZIDE 10 MG tablet   Commonly known as: GLUCOTROL   Take one in the morning and one half at bedtime      Lancets Misc   Use to check blood sugar daily      lisinopril 2.5 MG tablet   Commonly known as: PRINIVIL,ZESTRIL   Take 2.5 mg by mouth daily.      metFORMIN 1000 MG tablet   Commonly known as: GLUCOPHAGE   Take 1,000 mg by mouth 2 (two) times daily.      metoprolol tartrate 25 MG tablet   Commonly known as: LOPRESSOR   Take 1 tablet (25 mg total) by  mouth 2 (two) times daily.      simvastatin 40 MG tablet   Commonly known as: ZOCOR   Take 40 mg by mouth at bedtime.      Vitamin B-12 CR 1000 MCG Tbcr   Take 1 tablet by mouth daily.      vitamin C 500 MG tablet   Commonly known as: ASCORBIC ACID   Take 500 mg by mouth daily.            Disposition and Follow-up:  - follow up with PCP in 1 week  Consults:  1. cardiology  Significant Diagnostic Studies:  Dg Chest Portable 1 View  03/13/2011  *RADIOLOGY REPORT*  Clinical Data: Weakness and nausea.  PORTABLE CHEST - 1 VIEW  Comparison: None.  Findings: The heart size is normal.  The lungs are clear.  The visualized soft tissues and bony thorax are unremarkable.  IMPRESSION: Negative chest.  Original Report Authenticated By: Jamesetta Orleans. MATTERN, M.D.    Brief H and P: 61GNF with h/o DM (last A1c 6.7 in 09/2010), HL presented with vagal type symptoms of nausea,  clamminess, paleness, diaphoresis x 2 on the  day of admission.  While eating lunch with friends  the pt reported he developed sudden nausea, but no  abdominal pain or vomiting, which then progressed to sensation of diffuse clamminess, paleness,  and forehead diahporesis, but no chest pain, SOB, dizziness, vision or hearing changes,  tingling/numbness, or focal neuro deficits.   Physical Exam on Discharge:  Filed Vitals:   03/14/11 1520 03/14/11 2140 03/15/11 0548 03/15/11 1047  BP: 129/74 133/72 126/73 124/48  Pulse: 90 86 94   Temp: 98.7 F (37.1 C) 98.6 F (37 C) 99.4 F (37.4 C)   TempSrc: Oral Oral Oral   Resp: 18 17 20    Height:      Weight:   76.4 kg (168 lb 6.9 oz)   SpO2: 99% 96% 96%      Intake/Output Summary (Last 24 hours) at 03/15/11 1310 Last data filed at 03/15/11 0600  Gross per 24 hour  Intake    483 ml  Output   1100 ml  Net   -617 ml    General: Alert, awake, oriented x3, in no acute distress. HEENT: No bruits, no goiter. Heart: Regular rate and rhythm, without murmurs, rubs,  gallops. Lungs: Clear to auscultation bilaterally. Abdomen: Soft, nontender, nondistended, positive bowel sounds. Extremities: No clubbing cyanosis or edema with positive pedal pulses. Neuro: Grossly intact, nonfocal.  CBC:    Component Value Date/Time   WBC 3.0* 03/15/2011 0750   HGB 13.6 03/15/2011 0750   HCT 39.2 03/15/2011 0750   PLT 110* 03/15/2011 0750   MCV 94.2 03/15/2011 0750   NEUTROABS 6.2 03/13/2011 1548   LYMPHSABS 0.2* 03/13/2011 1548   MONOABS 0.4 03/13/2011 1548   EOSABS 0.1 03/13/2011 1548   BASOSABS 0.0 03/13/2011 1548    Basic Metabolic Panel:    Component Value Date/Time   NA 140 03/15/2011 0750   K 4.0 03/15/2011 0750   CL 106 03/15/2011 0750   CO2 26 03/15/2011 0750   BUN 12 03/15/2011 0750   CREATININE 1.01 03/15/2011 0750   GLUCOSE 161* 03/15/2011 0750   CALCIUM 8.7 03/15/2011 0750    Time spent on Discharge: Greater than 45 minutes  Signed: Macey Wurtz 03/15/2011, 1:10 PM

## 2011-03-15 NOTE — Progress Notes (Signed)
IV d/c'd.  Tele d/c'd.  Pt d/c'd to home.  Home meds and d/c instructions have been discussed with pt and pt family.  Both deny any questions or concerns at this time.  Pt leaving unit via wheelchair and appears in no acute distress. Darthy Manganelli RN 

## 2011-03-16 NOTE — Progress Notes (Signed)
Utilization review complete 

## 2011-03-20 ENCOUNTER — Ambulatory Visit (INDEPENDENT_AMBULATORY_CARE_PROVIDER_SITE_OTHER): Payer: 59 | Admitting: Family Medicine

## 2011-03-20 ENCOUNTER — Encounter: Payer: Self-pay | Admitting: Family Medicine

## 2011-03-20 DIAGNOSIS — R55 Syncope and collapse: Secondary | ICD-10-CM

## 2011-03-20 DIAGNOSIS — D649 Anemia, unspecified: Secondary | ICD-10-CM

## 2011-03-20 DIAGNOSIS — D72819 Decreased white blood cell count, unspecified: Secondary | ICD-10-CM

## 2011-03-20 DIAGNOSIS — E119 Type 2 diabetes mellitus without complications: Secondary | ICD-10-CM

## 2011-03-20 DIAGNOSIS — Z125 Encounter for screening for malignant neoplasm of prostate: Secondary | ICD-10-CM

## 2011-03-20 DIAGNOSIS — R9431 Abnormal electrocardiogram [ECG] [EKG]: Secondary | ICD-10-CM

## 2011-03-20 DIAGNOSIS — E785 Hyperlipidemia, unspecified: Secondary | ICD-10-CM

## 2011-03-20 DIAGNOSIS — D696 Thrombocytopenia, unspecified: Secondary | ICD-10-CM

## 2011-03-20 DIAGNOSIS — I10 Essential (primary) hypertension: Secondary | ICD-10-CM

## 2011-03-20 HISTORY — DX: Decreased white blood cell count, unspecified: D72.819

## 2011-03-20 LAB — CBC WITH DIFFERENTIAL/PLATELET
Basophils Relative: 0.5 % (ref 0.0–3.0)
Eosinophils Relative: 2.1 % (ref 0.0–5.0)
Lymphocytes Relative: 22.8 % (ref 12.0–46.0)
Monocytes Relative: 9.6 % (ref 3.0–12.0)
Neutrophils Relative %: 65 % (ref 43.0–77.0)
Platelets: 143 10*3/uL — ABNORMAL LOW (ref 150.0–400.0)
RBC: 3.89 Mil/uL — ABNORMAL LOW (ref 4.22–5.81)
WBC: 3.5 10*3/uL — ABNORMAL LOW (ref 4.5–10.5)

## 2011-03-20 LAB — VITAMIN B12: Vitamin B-12: 684 pg/mL (ref 211–911)

## 2011-03-20 NOTE — Progress Notes (Signed)
  Subjective:    Patient ID: Eduardo Pierce, male    DOB: 1950-11-02, 61 y.o.   MRN: [id number]  HPI CC: hosp f/u  Goes by "Eduardo Pierce"   1 wk ago had episode during middle of lunch where felt sweaty, dizzy, felt presyncopal.  Sugar tested and noraml.  Had second episode while in ER, bp 104/60, HR 49.  EKG with changes so admitted to hospital.  Cardiology consulted.  No problems with heart, told thought vagal episode.    Notes sugars ran high while in hospital, did not receive SSI.  Better sugar control at home.  Records reviewed: CXR - WNL, nuclear stress echo normal.  Noted NSVT on tele.  rec by cards to start b blocker.  Noted low white count to 3.0, plt 110.  Pt on aspirin.  H/o low WBC in 2008 to 4.0.  Unsure if w/u in past.  Started on metoprolol per cards recs.  Here today to check on vitals.  Brings log of bp and HR: bp ranging 112-142/60-70s, HR 70-80s.  Since discharge, feeling great.  No fevers/chills, chest pain/tightness, SOB, dizziness, abd pain, n/v/d.  No recent cold sxs.  Medications and allergies reviewed and updated in chart.  Past Medical History  Diagnosis Date  . Diabetes mellitus type II 10/20/1995  . Hyperlipidemia 02/20/1996   No past surgical history on file.  Family History  Problem Relation Age of Onset  . Hypertension Mother   . Diabetes Mother   . Stroke Mother   . Kidney disease Mother     failure-dialysis  . Heart disease Mother     MI  . Heart disease Other     CHF, CABG  . Cancer Other     lung   History   Social History  . Marital Status: Married    Spouse Name: N/A    Number of Children: 3  . Years of Education: N/A   Occupational History  . [employer]. [employer]    Social History Main Topics  . Smoking status: Former Smoker -- 0.5 packs/day for 18 years    Types: Cigarettes    Quit date: 01/19/1986  . Smokeless tobacco: None  . Alcohol Use: No  . Drug Use: No  . Sexually Active: None   Other Topics Concern  .  None   Social History Narrative   "Eduardo Pierce"Hobbies mgf rep pumpsMarried lives wife3 children, 1 out of home, 1 grandchildExercises 2-3 times per week    Review of Systems Per HPI    Objective:   Physical Exam  Nursing note and vitals reviewed. Constitutional: He appears well-developed and well-nourished. No distress.  HENT:  Head: Normocephalic and atraumatic.  Mouth/Throat: Oropharynx is clear and moist. No oropharyngeal exudate.  Eyes: Conjunctivae and EOM are normal. Pupils are equal, round, and reactive to light. No scleral icterus.  Neck: Normal range of motion. Neck supple. Carotid bruit is not present.  Cardiovascular: Normal rate, regular rhythm, normal heart sounds and intact distal pulses.   No murmur heard. Pulmonary/Chest: Effort normal and breath sounds normal. No respiratory distress. He has no wheezes. He has no rales.  Musculoskeletal: He exhibits no edema.  Lymphadenopathy:    He has no cervical adenopathy.  Skin: Skin is warm and dry. No rash noted.  Psychiatric: He has a normal mood and affect.       Assessment & Plan:

## 2011-03-20 NOTE — Assessment & Plan Note (Signed)
Chronic, stable. Good control, continue meds.

## 2011-03-20 NOTE — Assessment & Plan Note (Signed)
Chronic, stable. continue meds. 

## 2011-03-20 NOTE — Patient Instructions (Signed)
Blood work today to recheck blood counts. Good to meet you today, see you in a month.

## 2011-03-20 NOTE — Assessment & Plan Note (Signed)
Noted last hospitalization.  Recheck today.  If staying low, check periph smear.

## 2011-03-20 NOTE — Assessment & Plan Note (Signed)
Normal cardiac w/u during hospitalization 02/2011. rec start b blocker for NSVT on tele. Good bp/HR range at home.   Continue b blocker as well as other meds.

## 2011-03-20 NOTE — Assessment & Plan Note (Signed)
Some q waves on last EKG but normal cardiac w/u during hospitalization 02/2011.

## 2011-03-20 NOTE — Assessment & Plan Note (Signed)
Mild leukopenia also noted 2008.  Seems to be chronic.  Recheck today, if staying low, consider periph smear. Check B12 today.

## 2011-03-23 ENCOUNTER — Other Ambulatory Visit: Payer: Self-pay | Admitting: *Deleted

## 2011-03-23 MED ORDER — SIMVASTATIN 40 MG PO TABS: 40.0000 mg | ORAL_TABLET | Freq: Every day | ORAL | Status: DC

## 2011-04-03 ENCOUNTER — Other Ambulatory Visit: Payer: Self-pay | Admitting: *Deleted

## 2011-04-03 MED ORDER — LISINOPRIL 2.5 MG PO TABS: 2.5000 mg | ORAL_TABLET | Freq: Every day | ORAL | Status: DC

## 2011-04-03 MED ORDER — PIOGLITAZONE HCL 45 MG PO TABS: 45.0000 mg | ORAL_TABLET | Freq: Every day | ORAL | Status: DC

## 2011-04-03 MED ORDER — GLIPIZIDE 10 MG PO TABS: ORAL_TABLET | ORAL | Status: DC

## 2011-04-03 MED ORDER — METFORMIN HCL 1000 MG PO TABS: 1000.0000 mg | ORAL_TABLET | Freq: Two times a day (BID) | ORAL | Status: DC

## 2011-04-13 ENCOUNTER — Other Ambulatory Visit (INDEPENDENT_AMBULATORY_CARE_PROVIDER_SITE_OTHER): Payer: 59

## 2011-04-13 DIAGNOSIS — E119 Type 2 diabetes mellitus without complications: Secondary | ICD-10-CM

## 2011-04-13 DIAGNOSIS — I10 Essential (primary) hypertension: Secondary | ICD-10-CM

## 2011-04-13 DIAGNOSIS — D72819 Decreased white blood cell count, unspecified: Secondary | ICD-10-CM

## 2011-04-13 DIAGNOSIS — E785 Hyperlipidemia, unspecified: Secondary | ICD-10-CM

## 2011-04-13 DIAGNOSIS — D649 Anemia, unspecified: Secondary | ICD-10-CM

## 2011-04-13 DIAGNOSIS — D696 Thrombocytopenia, unspecified: Secondary | ICD-10-CM

## 2011-04-13 DIAGNOSIS — Z125 Encounter for screening for malignant neoplasm of prostate: Secondary | ICD-10-CM

## 2011-04-13 LAB — BASIC METABOLIC PANEL
CO2: 28 mEq/L (ref 19–32)
Chloride: 103 mEq/L (ref 96–112)
Glucose, Bld: 148 mg/dL — ABNORMAL HIGH (ref 70–99)
Sodium: 138 mEq/L (ref 135–145)

## 2011-04-13 LAB — CBC WITH DIFFERENTIAL/PLATELET
Eosinophils Relative: 2.2 % (ref 0.0–5.0)
HCT: 42 % (ref 39.0–52.0)
Lymphs Abs: 0.9 10*3/uL (ref 0.7–4.0)
Monocytes Relative: 11.3 % (ref 3.0–12.0)
Neutrophils Relative %: 62.9 % (ref 43.0–77.0)
Platelets: 135 10*3/uL — ABNORMAL LOW (ref 150.0–400.0)
WBC: 3.9 10*3/uL — ABNORMAL LOW (ref 4.5–10.5)

## 2011-04-13 LAB — PSA: PSA: 0.22 ng/mL (ref 0.10–4.00)

## 2011-04-13 LAB — MICROALBUMIN / CREATININE URINE RATIO
Creatinine,U: 218.1 mg/dL
Microalb Creat Ratio: 0.3 mg/g (ref 0.0–30.0)
Microalb, Ur: 0.6 mg/dL (ref 0.0–1.9)

## 2011-04-13 LAB — HEMOGLOBIN A1C: Hgb A1c MFr Bld: 6.8 % — ABNORMAL HIGH (ref 4.6–6.5)

## 2011-04-13 LAB — LIPID PANEL: Total CHOL/HDL Ratio: 3

## 2011-04-13 LAB — FOLATE: Folate: 22 ng/mL (ref >5.9)

## 2011-04-14 LAB — PATHOLOGIST SMEAR REVIEW

## 2011-04-15 ENCOUNTER — Encounter: Payer: Self-pay | Admitting: Family Medicine

## 2011-04-17 ENCOUNTER — Encounter: Payer: 59 | Admitting: Family Medicine

## 2011-04-20 ENCOUNTER — Ambulatory Visit (INDEPENDENT_AMBULATORY_CARE_PROVIDER_SITE_OTHER): Payer: 59 | Admitting: Family Medicine

## 2011-04-20 ENCOUNTER — Encounter: Payer: Self-pay | Admitting: Family Medicine

## 2011-04-20 ENCOUNTER — Encounter: Payer: Self-pay | Admitting: Gastroenterology

## 2011-04-20 VITALS — BP 110/68 | HR 80 | Temp 97.9°F | Wt 169.8 lb

## 2011-04-20 DIAGNOSIS — E119 Type 2 diabetes mellitus without complications: Secondary | ICD-10-CM

## 2011-04-20 DIAGNOSIS — Z1211 Encounter for screening for malignant neoplasm of colon: Secondary | ICD-10-CM

## 2011-04-20 DIAGNOSIS — I10 Essential (primary) hypertension: Secondary | ICD-10-CM

## 2011-04-20 DIAGNOSIS — D696 Thrombocytopenia, unspecified: Secondary | ICD-10-CM

## 2011-04-20 DIAGNOSIS — Z0000 Encounter for general adult medical examination without abnormal findings: Secondary | ICD-10-CM

## 2011-04-20 NOTE — Assessment & Plan Note (Signed)
Chronic, stable, controlled. Continue meds.

## 2011-04-20 NOTE — Assessment & Plan Note (Signed)
Chronic. Stable. Continue meds. Discussed use of B blocker if HR starts trending up.

## 2011-04-20 NOTE — Assessment & Plan Note (Signed)
Reviewed preventative protocols and updated unless pt declined. Discussed healthy diet and lifestyle. Reviewed blood work in detail. 

## 2011-04-20 NOTE — Patient Instructions (Addendum)
If pulse is running consistently higher than 80, start metoprolol 1/2 pill twice daily. Good to see you today, call us with questions. Pass by [facility]' office for referral for colonoscopy (after May 1st) Return 6 mo for f/u DM.

## 2011-04-20 NOTE — Progress Notes (Signed)
Subjective:    Patient ID: Eduardo Pierce, male    DOB: [birth year], 61 y.o.   MRN: [id number]  HPI CC: CPE  No questions or concerns today.  Question about metoprolol that was started in hospital.  HR at home running 60-80s.  Low plt - on ASA 325mg .  Normal B12 and periph smear.  Preventative: Colon screening - would like colonoscopy - would like after May 1st. Prostate - normal PSA today.  Pneumonia - declines. Influenza - declines. Td - 2006.  Medications and allergies reviewed and updated in chart.  Past histories reviewed and updated if relevant as below. Patient Active Problem List  Diagnoses  . DIABETES MELLITUS, TYPE II  . HYPERLIPIDEMIA  . HYPERTENSION, BENIGN ESSENTIAL  . Abnormal ECG  . Pre-syncope  . Leukopenia  . Thrombocytopenia   Past Medical History  Diagnosis Date  . Diabetes mellitus type II 10/20/1995  . Hyperlipidemia 02/20/1996  . Leukopenia 03/2011    mild, normal periph smear, normal B12 and folate  . Thrombocytopenia 03/2011   Past Surgical History  Procedure Date  . Hospitalization 02/2011    vasovagal presyncope, some NSVT on tele  . Nuclear stress echo 02/2011    no ischemia/infarct, normal LV wall motion, EF 68%   History  Substance Use Topics  . Smoking status: Former Smoker -- 0.5 packs/day for 18 years    Types: Cigarettes    Quit date: 01/19/1986  . Smokeless tobacco: Never Used  . Alcohol Use: No   Family History  Problem Relation Age of Onset  . Hypertension Mother   . Diabetes Mother   . Stroke Mother   . Kidney disease Mother     failure-dialysis  . Heart disease Mother     MI  . Heart disease Other     CHF, CABG  . Cancer Other     lung   Allergies  Allergen Reactions  . Penicillins     REACTION: tingling, nausea, dizzyness   Current Outpatient Prescriptions on File Prior to Visit  Medication Sig Dispense Refill  . aspirin EC 325 MG tablet Take 325 mg by mouth daily.      . Cyanocobalamin (VITAMIN B-12 CR)  1000 MCG TBCR Take 1 tablet by mouth daily.       Marland Kitchen glipiZIDE (GLUCOTROL) 10 MG tablet Take one in the morning and one half at bedtime  45 tablet  11  . glucose blood (CHOICE DM FORA G20 TEST STRIPS) test strip 1 each by Other route daily as needed. Patient's choice       . glucose blood (FREESTYLE LITE) test strip Check daily and as needed.  250.00.  50 each  11  . Lancets MISC Use to check blood sugar daily       . lisinopril (PRINIVIL,ZESTRIL) 2.5 MG tablet Take 1 tablet (2.5 mg total) by mouth daily.  90 tablet  3  . metFORMIN (GLUCOPHAGE) 1000 MG tablet Take 1 tablet (1,000 mg total) by mouth 2 (two) times daily.  180 tablet  3  . pioglitazone (ACTOS) 45 MG tablet Take 1 tablet (45 mg total) by mouth daily.  30 tablet  11  . simvastatin (ZOCOR) 40 MG tablet Take 1 tablet (40 mg total) by mouth at bedtime.  90 tablet  3  . vitamin C (ASCORBIC ACID) 500 MG tablet Take 500 mg by mouth daily.        Marland Kitchen DISCONTD: metoprolol tartrate (LOPRESSOR) 25 MG tablet Take 1 tablet (25 mg total)  by mouth 2 (two) times daily.  60 tablet  0    Review of Systems  Constitutional: Negative for fever, chills, activity change, appetite change, fatigue and unexpected weight change.  HENT: Negative for hearing loss and neck pain.   Eyes: Negative for visual disturbance.  Respiratory: Negative for cough, chest tightness, shortness of breath and wheezing.   Cardiovascular: Negative for chest pain, palpitations and leg swelling.  Gastrointestinal: Negative for nausea, vomiting, abdominal pain, diarrhea, constipation, blood in stool and abdominal distention.  Genitourinary: Negative for hematuria and difficulty urinating.  Musculoskeletal: Negative for myalgias and arthralgias.  Skin: Negative for rash.  Neurological: Negative for dizziness, seizures, syncope and headaches.  Hematological: Does not bruise/bleed easily.  Psychiatric/Behavioral: Negative for dysphoric mood. The patient is not nervous/anxious.          Objective:   Physical Exam  Nursing note and vitals reviewed. Constitutional: He is oriented to person, place, and time. He appears well-developed and well-nourished. No distress.  HENT:  Head: Normocephalic and atraumatic.  Right Ear: External ear normal.  Left Ear: External ear normal.  Nose: Nose normal.  Mouth/Throat: Oropharynx is clear and moist. No oropharyngeal exudate.  Eyes: Conjunctivae and EOM are normal. Pupils are equal, round, and reactive to light. No scleral icterus.  Neck: Normal range of motion. Neck supple. No thyromegaly present.  Cardiovascular: Normal rate, regular rhythm, normal heart sounds and intact distal pulses.   No murmur heard. Pulses:      Radial pulses are 2+ on the right side, and 2+ on the left side.  Pulmonary/Chest: Effort normal and breath sounds normal. No respiratory distress. He has no wheezes. He has no rales.  Abdominal: Soft. Bowel sounds are normal. He exhibits no distension and no mass. There is no tenderness. There is no rebound and no guarding.  Genitourinary: Rectum normal and prostate normal. Rectal exam shows no external hemorrhoid, no internal hemorrhoid, no fissure, no mass, no tenderness and anal tone normal. Prostate is not enlarged (15-20gm) and not tender.  Musculoskeletal: Normal range of motion. He exhibits no edema.  Lymphadenopathy:    He has no cervical adenopathy.  Neurological: He is alert and oriented to person, place, and time.       CN grossly intact, station and gait intact  Skin: Skin is warm and dry. No rash noted.  Psychiatric: He has a normal mood and affect. His behavior is normal. Judgment and thought content normal.       Assessment & Plan:

## 2011-04-20 NOTE — Assessment & Plan Note (Signed)
Normal b12 and periph smear.  anticipate due to ASA

## 2011-05-09 ENCOUNTER — Other Ambulatory Visit: Payer: Self-pay | Admitting: Family Medicine

## 2011-05-11 ENCOUNTER — Other Ambulatory Visit: Payer: Self-pay

## 2011-05-11 NOTE — Telephone Encounter (Signed)
Received refill request electronically from pharmacy.  Refill shows Glipizide ER) 10 mg 24 hours, this does not match med sheet. Please advise if patient should be taking Glipizide plain or ER?

## 2011-05-11 NOTE — Telephone Encounter (Signed)
Pt wants to verify refills at [facility] [address] for Actos 45 mg and Glipizide 10 mg. Both refilled 1 yr on 04/03/11. [facility] closed now but pt will contact later for refills.

## 2011-05-14 NOTE — Telephone Encounter (Signed)
Message left for patient to return my call and advise.  

## 2011-05-14 NOTE — Telephone Encounter (Signed)
plz check with pt which he is taking.

## 2011-05-19 ENCOUNTER — Telehealth: Payer: Self-pay | Admitting: *Deleted

## 2011-05-19 NOTE — Telephone Encounter (Signed)
Spoke with patient and confirmed that right now he is taking Glipizide 10 mg 1 PO QAM and 1/2 tablet QPM. He had been taking the XL pill the same way based on Dr. Lyndel Pleasure instructions. I advised that the XL pill should not be cut in half but that the regular release pill was fine to cut in half. He wanted me to check with you to see if he should continue the regular the way he is or if he needs to be on the XL pill.

## 2011-05-19 NOTE — Telephone Encounter (Signed)
Message left notifying patient and pharmacy aware.  

## 2011-05-19 NOTE — Telephone Encounter (Signed)
Agree - pt should be on regular release 1 in morning and 1/2 at night.

## 2011-05-20 HISTORY — PX: COLONOSCOPY: SHX174

## 2011-05-29 ENCOUNTER — Ambulatory Visit (AMBULATORY_SURGERY_CENTER): Payer: 59 | Admitting: *Deleted

## 2011-05-29 ENCOUNTER — Encounter: Payer: Self-pay | Admitting: Gastroenterology

## 2011-05-29 VITALS — Ht 69.0 in | Wt 172.0 lb

## 2011-05-29 DIAGNOSIS — Z1211 Encounter for screening for malignant neoplasm of colon: Secondary | ICD-10-CM

## 2011-05-29 MED ORDER — PEG-KCL-NACL-NASULF-NA ASC-C 100 G PO SOLR: ORAL | Status: DC

## 2011-06-08 ENCOUNTER — Encounter: Payer: Self-pay | Admitting: Gastroenterology

## 2011-06-08 ENCOUNTER — Ambulatory Visit (AMBULATORY_SURGERY_CENTER): Payer: 59 | Admitting: Gastroenterology

## 2011-06-08 VITALS — BP 126/80 | HR 78 | Temp 96.0°F | Resp 16 | Ht 69.0 in | Wt 172.0 lb

## 2011-06-08 DIAGNOSIS — Z1211 Encounter for screening for malignant neoplasm of colon: Secondary | ICD-10-CM

## 2011-06-08 DIAGNOSIS — D126 Benign neoplasm of colon, unspecified: Secondary | ICD-10-CM

## 2011-06-08 LAB — GLUCOSE, CAPILLARY: Glucose-Capillary: 106 mg/dL — ABNORMAL HIGH (ref 70–99)

## 2011-06-08 MED ORDER — SODIUM CHLORIDE 0.9 % IV SOLN: 500.0000 mL | INTRAVENOUS | Status: DC

## 2011-06-08 NOTE — Patient Instructions (Signed)
YOU HAD AN ENDOSCOPIC PROCEDURE TODAY AT THE [facility]: Refer to the procedure report that was given to you for any specific questions about what was found during the examination.  If the procedure report does not answer your questions, please call your gastroenterologist to clarify.  If you requested that your care partner not be given the details of your procedure findings, then the procedure report has been included in a sealed envelope for you to review at your convenience later.  YOU SHOULD EXPECT: Some feelings of bloating in the abdomen. Passage of more gas than usual.  Walking can help get rid of the air that was put into your GI tract during the procedure and reduce the bloating. If you had a lower endoscopy (such as a colonoscopy or flexible sigmoidoscopy) you may notice spotting of blood in your stool or on the toilet paper. If you underwent a bowel prep for your procedure, then you may not have a normal bowel movement for a few days.  DIET: Your first meal following the procedure should be a light meal and then it is ok to progress to your normal diet.  A half-sandwich or bowl of soup is an example of a good first meal.  Heavy or fried foods are harder to digest and may make you feel nauseous or bloated.  Likewise meals heavy in dairy and vegetables can cause extra gas to form and this can also increase the bloating.  Drink plenty of fluids but you should avoid alcoholic beverages for 24 hours.  ACTIVITY: Your care partner should take you home directly after the procedure.  You should plan to take it easy, moving slowly for the rest of the day.  You can resume normal activity the day after the procedure however you should NOT DRIVE or use heavy machinery for 24 hours (because of the sedation medicines used during the test).    SYMPTOMS TO REPORT IMMEDIATELY: A gastroenterologist can be reached at any hour.  During normal business hours, 8:30 AM to 5:00 PM Monday through Friday,  call [phone].  After hours and on weekends, please call the GI answering service at [phone] who will take a message and have the physician on call contact you.   Following lower endoscopy (colonoscopy or flexible sigmoidoscopy):  Excessive amounts of blood in the stool  Significant tenderness or worsening of abdominal pains  Swelling of the abdomen that is new, acute  Fever of 100F or higher    FOLLOW UP: If any biopsies were taken you will be contacted by phone or by letter within the next 1-3 weeks.  Call your gastroenterologist if you have not heard about the biopsies in 3 weeks.  Our staff will call the home number listed on your records the next business day following your procedure to check on you and address any questions or concerns that you may have at that time regarding the information given to you following your procedure. This is a courtesy call and so if there is no answer at the home number and we have not heard from you through the emergency physician on call, we will assume that you have returned to your regular daily activities without incident.  SIGNATURES/CONFIDENTIALITY: You and/or your care partner have signed paperwork which will be entered into your electronic medical record.  These signatures attest to the fact that that the information above on your After Visit Summary has been reviewed and is understood.  Full responsibility of the confidentiality   of this discharge information lies with you and/or your care-partner.     

## 2011-06-08 NOTE — Progress Notes (Signed)
Patient did not have preoperative order for IV antibiotic SSI prophylaxis. (G8918)  Patient did not experience any of the following events: a burn prior to discharge; a fall within the facility; wrong site/side/patient/procedure/implant event; or a hospital transfer or hospital admission upon discharge from the facility. (G8907)  

## 2011-06-08 NOTE — Op Note (Signed)
 [facility] [address] [place], [place]  [address]  COLONOSCOPY PROCEDURE REPORT  PATIENT:  Eduardo Pierce, Eduardo Pierce  MR#:  [id number] BIRTHDATE:  [birth year], 60 yrs. old  GENDER:  male ENDOSCOPIST:  Rachael Fee, MD REF. BY:  Eustaquio Boyden, M.D. PROCEDURE DATE:  06/08/2011 PROCEDURE:  Colonoscopy with snare polypectomy ASA CLASS:  Class II INDICATIONS:  Routine Risk Screening MEDICATIONS:   Fentanyl 50 mcg IV, These medications were titrated to patient response per physician's verbal order, Versed 7 mg IV  DESCRIPTION OF PROCEDURE:   After the risks benefits and alternatives of the procedure were thoroughly explained, informed consent was obtained.  Digital rectal exam was performed and revealed no rectal masses.   The LB [id number] endoscope was introduced through the anus and advanced to the cecum, which was identified by both the appendix and ileocecal valve, without limitations.  The quality of the prep was good..  The instrument was then slowly withdrawn as the colon was fully examined. <<PROCEDUREIMAGES>> FINDINGS:  A diminutive polyp was found in the descending colon (see image3).  This was otherwise a normal examination of the colon (see image1, image2, and image4).   Retroflexed views in the rectum revealed no abnormalities. COMPLICATIONS:  None  ENDOSCOPIC IMPRESSION: 1) Diminutive polyp in the descending colon, this was removed and sent to pathology 2) Otherwise normal examination  RECOMMENDATIONS: 1) If the polyp(s) removed today are proven to be adenomatous (pre-cancerous) polyps, you will need a repeat colonoscopy in 5 years. Otherwise you should continue to follow colorectal cancer screening guidelines for "routine risk" patients with colonoscopy in 10 years. You will receive a letter within 1-2 weeks with the results of your biopsy as well as final recommendations. Please call my office if you have not received a letter after 3  weeks.  ______________________________ Rachael Fee, MD  n. eSIGNED:   Rachael Fee at 06/08/2011 09:08 AM  Cleotis Nipper, [phone]

## 2011-06-09 ENCOUNTER — Telehealth: Payer: Self-pay | Admitting: *Deleted

## 2011-06-09 NOTE — Telephone Encounter (Signed)
  Follow up Call-  Call back number 06/08/2011  Post procedure Call Back phone  # [phone] or [phone]  Permission to leave phone message Yes     Patient questions:  Do you have a fever, pain , or abdominal swelling? no Pain Score  0 *  Have you tolerated food without any problems? yes  Have you been able to return to your normal activities? yes  Do you have any questions about your discharge instructions: Diet   Medications no   Follow up visit  no  Do you have questions or concerns about your Care? no  Actions: * If pain score is 4 or above: No action needed, pain <4.    Pt had no complaints,said he felt good .

## 2011-06-12 ENCOUNTER — Encounter: Payer: Self-pay | Admitting: Family Medicine

## 2011-06-17 ENCOUNTER — Encounter: Payer: Self-pay | Admitting: Gastroenterology

## 2011-08-02 ENCOUNTER — Encounter: Payer: Self-pay | Admitting: Family Medicine

## 2011-08-27 ENCOUNTER — Ambulatory Visit (HOSPITAL_BASED_OUTPATIENT_CLINIC_OR_DEPARTMENT_OTHER): Admission: RE | Admit: 2011-08-27 | Payer: 59 | Source: Ambulatory Visit | Admitting: Orthopedic Surgery

## 2011-08-27 ENCOUNTER — Encounter (HOSPITAL_BASED_OUTPATIENT_CLINIC_OR_DEPARTMENT_OTHER): Admission: RE | Payer: Self-pay | Source: Ambulatory Visit

## 2011-08-27 SURGERY — ULNAR NERVE DECOMPRESSION/TRANSPOSITION
Anesthesia: General | Laterality: Right

## 2011-10-09 ENCOUNTER — Other Ambulatory Visit: Payer: Self-pay | Admitting: Family Medicine

## 2011-10-09 DIAGNOSIS — E119 Type 2 diabetes mellitus without complications: Secondary | ICD-10-CM

## 2011-10-09 DIAGNOSIS — D696 Thrombocytopenia, unspecified: Secondary | ICD-10-CM

## 2011-10-13 ENCOUNTER — Other Ambulatory Visit (INDEPENDENT_AMBULATORY_CARE_PROVIDER_SITE_OTHER): Payer: 59

## 2011-10-13 DIAGNOSIS — E119 Type 2 diabetes mellitus without complications: Secondary | ICD-10-CM

## 2011-10-13 DIAGNOSIS — D696 Thrombocytopenia, unspecified: Secondary | ICD-10-CM

## 2011-10-13 LAB — CBC WITH DIFFERENTIAL/PLATELET
Basophils Absolute: 0 10*3/uL (ref 0.0–0.1)
Lymphocytes Relative: 16.1 % (ref 12.0–46.0)
Monocytes Relative: 8.6 % (ref 3.0–12.0)
Neutrophils Relative %: 72.6 % (ref 43.0–77.0)
Platelets: 150 10*3/uL (ref 150.0–400.0)
RDW: 13.4 % (ref 11.5–14.6)
WBC: 4.4 10*3/uL — ABNORMAL LOW (ref 4.5–10.5)

## 2011-10-13 LAB — BASIC METABOLIC PANEL
CO2: 27 mEq/L (ref 19–32)
Calcium: 9.2 mg/dL (ref 8.4–10.5)
Chloride: 102 mEq/L (ref 96–112)
Potassium: 4 mEq/L (ref 3.5–5.1)
Sodium: 143 mEq/L (ref 135–145)

## 2011-10-16 ENCOUNTER — Telehealth: Payer: Self-pay

## 2011-10-16 NOTE — Telephone Encounter (Signed)
Pt wanted hgb a1c result. Advised Dr Reece Agar would talk with pt at 10/20/11 visit. Pt said OK but wanted A1C result; given 6.4.pt will discuss at appt.

## 2011-10-20 ENCOUNTER — Ambulatory Visit (INDEPENDENT_AMBULATORY_CARE_PROVIDER_SITE_OTHER): Payer: 59 | Admitting: Family Medicine

## 2011-10-20 ENCOUNTER — Encounter: Payer: Self-pay | Admitting: Family Medicine

## 2011-10-20 VITALS — BP 130/82 | HR 84 | Temp 98.3°F | Wt 170.8 lb

## 2011-10-20 DIAGNOSIS — E119 Type 2 diabetes mellitus without complications: Secondary | ICD-10-CM

## 2011-10-20 DIAGNOSIS — D72819 Decreased white blood cell count, unspecified: Secondary | ICD-10-CM

## 2011-10-20 DIAGNOSIS — L570 Actinic keratosis: Secondary | ICD-10-CM | POA: Insufficient documentation

## 2011-10-20 DIAGNOSIS — D696 Thrombocytopenia, unspecified: Secondary | ICD-10-CM

## 2011-10-20 DIAGNOSIS — I10 Essential (primary) hypertension: Secondary | ICD-10-CM

## 2011-10-20 DIAGNOSIS — E785 Hyperlipidemia, unspecified: Secondary | ICD-10-CM

## 2011-10-20 NOTE — Assessment & Plan Note (Signed)
Chronic, stable. Continue meds. 

## 2011-10-20 NOTE — Assessment & Plan Note (Signed)
Slowly improving

## 2011-10-20 NOTE — Assessment & Plan Note (Signed)
Chronic, stable. Continue med.  Endorses never really having any high blood pressure issues.

## 2011-10-20 NOTE — Progress Notes (Signed)
  Subjective:    Patient ID: OTHER ATIENZA, male    DOB: April 28, 1950, 61 y.o.   MRN: [id number]  HPI CC: f/u DM  Would like skin spots checked.  Rough spots for last 4-5 months.  Does not wear sunscreen outside.  Occasionally wears hat.  DM - sugars ranging very well controlled, ocassoinally 130.  Averages 90-110 fasting.  Has been on 45mg  actos for several years.  UTD on eye exam.  Foot exam today.  Declines pneumovax and flu shot.  No paresthesias.  Denies blood in urine, no urinary changes.  In summer walks more than winter.  Also rides bike.  [place] Lab Results  Component Value Date   HGBA1C 6.4 10/13/2011     HTN - very minimal - on low dose lisinopril.  HLD - on zocor, no myalgias.  Had 1 episode of right hand numbness lateral hand.  Seen by hand doctor, thought had cabal tunnel syndrome, rec surgery.  Pain improved so cancelled surgery.  Preventative: Declines flu and pneumonia. Will check into shingles.  Past Medical History  Diagnosis Date  . Diabetes mellitus type II 10/20/1995  . Hyperlipidemia 02/20/1996  . Leukopenia 03/2011    mild, normal periph smear, normal B12 and folate  . Thrombocytopenia 03/2011     Review of Systems Per HPI    Objective:   Physical Exam  Nursing note and vitals reviewed. Constitutional: He appears well-developed and well-nourished. No distress.  HENT:  Head: Normocephalic and atraumatic.  Right Ear: External ear normal.  Left Ear: External ear normal.  Nose: Nose normal.  Mouth/Throat: Oropharynx is clear and moist. No oropharyngeal exudate.  Eyes: Conjunctivae normal and EOM are normal. Pupils are equal, round, and reactive to light. No scleral icterus.  Neck: Normal range of motion. Neck supple. Carotid bruit is not present.  Cardiovascular: Normal rate, regular rhythm, normal heart sounds and intact distal pulses.   No murmur heard. Pulmonary/Chest: Effort normal and breath sounds normal. No respiratory distress.  He has no wheezes. He has no rales.  Musculoskeletal: He exhibits no edema.       Diabetic foot exam: Normal inspection No skin breakdown No calluses  Normal DP/PT pulses Normal sensation to light tough and monofilament Nails normal  Lymphadenopathy:    He has no cervical adenopathy.  Skin: Skin is warm and dry. No rash noted.       AKs bilateral cheeks  Psychiatric: He has a normal mood and affect.       Assessment & Plan:

## 2011-10-20 NOTE — Assessment & Plan Note (Signed)
Better today.

## 2011-10-20 NOTE — Assessment & Plan Note (Signed)
Return for treatment

## 2011-10-20 NOTE — Patient Instructions (Addendum)
Return in 6 months for physical. Return prior fasting for blood work. Good to see you today, keep up the good work! Call your insurace about the shingles shot to see if it is covered or how much it would cost and where is cheaper (here or pharmacy).  If you want to receive here, call for nurse visit.

## 2011-10-20 NOTE — Assessment & Plan Note (Signed)
Chronic, tolerant with statin. continue med.

## 2011-11-23 ENCOUNTER — Ambulatory Visit: Payer: 59 | Admitting: Family Medicine

## 2012-01-10 ENCOUNTER — Telehealth: Payer: Self-pay | Admitting: Family Medicine

## 2012-01-10 NOTE — Telephone Encounter (Signed)
plz notify I received notice of recall of FreeStyle testing strips - recommend he contact [employer] (company that makes them) for replacement of current strips.

## 2012-01-12 NOTE — Telephone Encounter (Signed)
Patient notified. He said he had gotten the same notification and [employer] had sent him some replacement strips and he is in the process of working with them and the pharmacy right now about getting a full supply. He said it may come down to changing meters, but right now he is okay. He will let us know if he chooses to change.

## 2012-03-05 ENCOUNTER — Other Ambulatory Visit: Payer: Self-pay

## 2012-03-17 ENCOUNTER — Other Ambulatory Visit: Payer: Self-pay

## 2012-03-17 MED ORDER — SIMVASTATIN 40 MG PO TABS: 40.0000 mg | ORAL_TABLET | Freq: Every day | ORAL | Status: DC

## 2012-03-17 NOTE — Telephone Encounter (Signed)
Pt request #90 Simvastatin to [facility] [address]. Pt notified done; has CPX 04/18/12.

## 2012-03-28 ENCOUNTER — Other Ambulatory Visit: Payer: Self-pay

## 2012-03-28 MED ORDER — METFORMIN HCL 1000 MG PO TABS: 1000.0000 mg | ORAL_TABLET | Freq: Two times a day (BID) | ORAL | Status: DC

## 2012-03-28 NOTE — Telephone Encounter (Signed)
Pt left v/m requesting refill metformin 1000 mg # 60 to [facility] [address]. Pt scheduled appt 04/18/12. Left v/m refill done.

## 2012-04-04 ENCOUNTER — Other Ambulatory Visit: Payer: Self-pay | Admitting: Family Medicine

## 2012-04-04 ENCOUNTER — Other Ambulatory Visit: Payer: Self-pay

## 2012-04-04 DIAGNOSIS — E119 Type 2 diabetes mellitus without complications: Secondary | ICD-10-CM

## 2012-04-04 DIAGNOSIS — D696 Thrombocytopenia, unspecified: Secondary | ICD-10-CM

## 2012-04-04 DIAGNOSIS — I10 Essential (primary) hypertension: Secondary | ICD-10-CM

## 2012-04-04 DIAGNOSIS — Z125 Encounter for screening for malignant neoplasm of prostate: Secondary | ICD-10-CM

## 2012-04-04 DIAGNOSIS — E785 Hyperlipidemia, unspecified: Secondary | ICD-10-CM

## 2012-04-04 DIAGNOSIS — D72819 Decreased white blood cell count, unspecified: Secondary | ICD-10-CM

## 2012-04-04 MED ORDER — PIOGLITAZONE HCL 45 MG PO TABS: 45.0000 mg | ORAL_TABLET | Freq: Every day | ORAL | Status: DC

## 2012-04-04 NOTE — Telephone Encounter (Signed)
Pt request refill actos to [facility] [address]. Pt has appt with Dr Reece Agar 04/18/12.pt notified refill done.

## 2012-04-11 ENCOUNTER — Other Ambulatory Visit (INDEPENDENT_AMBULATORY_CARE_PROVIDER_SITE_OTHER): Payer: PRIVATE HEALTH INSURANCE

## 2012-04-11 DIAGNOSIS — Z125 Encounter for screening for malignant neoplasm of prostate: Secondary | ICD-10-CM

## 2012-04-11 DIAGNOSIS — E785 Hyperlipidemia, unspecified: Secondary | ICD-10-CM

## 2012-04-11 DIAGNOSIS — D72819 Decreased white blood cell count, unspecified: Secondary | ICD-10-CM

## 2012-04-11 DIAGNOSIS — E119 Type 2 diabetes mellitus without complications: Secondary | ICD-10-CM

## 2012-04-11 DIAGNOSIS — D696 Thrombocytopenia, unspecified: Secondary | ICD-10-CM

## 2012-04-11 LAB — HEMOGLOBIN A1C: Hgb A1c MFr Bld: 6.7 % — ABNORMAL HIGH (ref 4.6–6.5)

## 2012-04-11 LAB — CBC WITH DIFFERENTIAL/PLATELET
Basophils Absolute: 0 10*3/uL (ref 0.0–0.1)
HCT: 39.8 % (ref 39.0–52.0)
Lymphs Abs: 0.9 10*3/uL (ref 0.7–4.0)
MCV: 94.4 fl (ref 78.0–100.0)
Monocytes Absolute: 0.5 10*3/uL (ref 0.1–1.0)
Monocytes Relative: 11 % (ref 3.0–12.0)
Neutrophils Relative %: 62.4 % (ref 43.0–77.0)
Platelets: 177 10*3/uL (ref 150.0–400.0)
RDW: 14.4 % (ref 11.5–14.6)

## 2012-04-11 LAB — LIPID PANEL
Cholesterol: 130 mg/dL (ref 0–200)
HDL: 39.2 mg/dL
LDL Cholesterol: 73 mg/dL (ref 0–99)
Total CHOL/HDL Ratio: 3
Triglycerides: 88 mg/dL (ref 0.0–149.0)
VLDL: 17.6 mg/dL (ref 0.0–40.0)

## 2012-04-11 LAB — BASIC METABOLIC PANEL
Chloride: 103 mEq/L (ref 96–112)
Creatinine, Ser: 1.1 mg/dL (ref 0.4–1.5)
Sodium: 137 mEq/L (ref 135–145)

## 2012-04-11 LAB — MICROALBUMIN / CREATININE URINE RATIO
Microalb Creat Ratio: 0.3 mg/g (ref 0.0–30.0)
Microalb, Ur: 0.7 mg/dL (ref 0.0–1.9)

## 2012-04-11 LAB — PSA: PSA: 0.25 ng/mL (ref 0.10–4.00)

## 2012-04-12 ENCOUNTER — Other Ambulatory Visit: Payer: Self-pay

## 2012-04-12 MED ORDER — LISINOPRIL 2.5 MG PO TABS: 2.5000 mg | ORAL_TABLET | Freq: Every day | ORAL | Status: DC

## 2012-04-12 NOTE — Telephone Encounter (Signed)
Pt request refill lisinopril; pt has appt with Dr Reece Agar on 04/18/12, pt had labs today; pt said med less expensive if get 90 day. Notified pt done.

## 2012-04-14 ENCOUNTER — Telehealth: Payer: Self-pay

## 2012-04-14 NOTE — Telephone Encounter (Signed)
Pt request A1C results; advised pt Dr Reece Agar would review at 04/18/12 CPX; pt just wants value to make sure under 7. Advised pt A1C 6.7. Pt said would discuss with Dr Reece Agar at appt.

## 2012-04-18 ENCOUNTER — Ambulatory Visit (INDEPENDENT_AMBULATORY_CARE_PROVIDER_SITE_OTHER): Payer: PRIVATE HEALTH INSURANCE | Admitting: Family Medicine

## 2012-04-18 ENCOUNTER — Encounter: Payer: Self-pay | Admitting: Family Medicine

## 2012-04-18 VITALS — BP 110/64 | HR 84 | Temp 97.9°F | Ht 68.0 in | Wt 163.8 lb

## 2012-04-18 DIAGNOSIS — Z0000 Encounter for general adult medical examination without abnormal findings: Secondary | ICD-10-CM

## 2012-04-18 DIAGNOSIS — D72819 Decreased white blood cell count, unspecified: Secondary | ICD-10-CM

## 2012-04-18 DIAGNOSIS — E119 Type 2 diabetes mellitus without complications: Secondary | ICD-10-CM

## 2012-04-18 DIAGNOSIS — E785 Hyperlipidemia, unspecified: Secondary | ICD-10-CM

## 2012-04-18 MED ORDER — SIMVASTATIN 40 MG PO TABS: 40.0000 mg | ORAL_TABLET | Freq: Every day | ORAL | Status: DC

## 2012-04-18 MED ORDER — PIOGLITAZONE HCL 45 MG PO TABS: 45.0000 mg | ORAL_TABLET | Freq: Every day | ORAL | Status: DC

## 2012-04-18 MED ORDER — METFORMIN HCL 1000 MG PO TABS: 1000.0000 mg | ORAL_TABLET | Freq: Two times a day (BID) | ORAL | Status: DC

## 2012-04-18 MED ORDER — LISINOPRIL 2.5 MG PO TABS: 2.5000 mg | ORAL_TABLET | Freq: Every day | ORAL | Status: DC

## 2012-04-18 MED ORDER — GLIPIZIDE 10 MG PO TABS: ORAL_TABLET | ORAL | Status: DC

## 2012-04-18 NOTE — Assessment & Plan Note (Signed)
Preventative protocols reviewed and updated unless pt declined. Discussed healthy diet and lifestyle. Reviewed blood work in detail. 

## 2012-04-18 NOTE — Assessment & Plan Note (Signed)
Chronic, stable 

## 2012-04-18 NOTE — Assessment & Plan Note (Signed)
Great control, continue med.

## 2012-04-18 NOTE — Patient Instructions (Addendum)
Good job with sugars. Cut down to 81mg  aspirin (enteric coated). We can space out prostate checks to every 2 years as always normal. Good to see you today, call us with questions. Return in 6 months for diabetes check.

## 2012-04-18 NOTE — Progress Notes (Addendum)
Subjective:    Patient ID: Eduardo Pierce, male    DOB: 07/06/1950, 62 y.o.   MRN: [id number]  HPI CC: annual exam  No questions/concerns today.  Wt Readings from Last 3 Encounters:  04/18/12 163 lb 12 oz (74.277 kg)  10/20/11 170 lb 12 oz (77.452 kg)  06/08/11 172 lb (78.019 kg)  weight loss noted.  Not trying.  Stays active.  DM - checks sugars at least QOD, some times more (fasting).  Compliant with meds.  Denies leg swelling, blood in urine.  Last foot exam was 10/2011.  Last eye exam 11/2011.  HLD - simvastatin, tolerating well.  HTN - compliant with lisinopril.  Never had high blood pressure.  Takes more for renoprotection in h/o diabetes.  Ex smoker - quit 1988.  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  Sugar free diet.  Preventative:  Colon screening - 06/08/2011.  Prolapse type polyp, rpt due 10 yrs Christella Hartigan). Prostate - normal PSA today (0.25). Normal DRE today.  May space out to Q2 yrs. Pneumonia - declines. Influenza - declines. Td - 2006.  Medications and allergies reviewed and updated in chart.  Past histories reviewed and updated if relevant as below. Patient Active Problem List  Diagnosis  . Diabetes type 2, controlled  . HYPERLIPIDEMIA  . HYPERTENSION, BENIGN ESSENTIAL  . Abnormal ECG  . Pre-syncope  . Leukopenia  . Thrombocytopenia  . Healthcare maintenance  . AK (actinic keratosis)   Past Medical History  Diagnosis Date  . Diabetes mellitus type II 10/20/1995  . Hyperlipidemia 02/20/1996  . Leukopenia 03/2011    mild, normal periph smear, normal B12 and folate  . Thrombocytopenia 03/2011   Past Surgical History  Procedure Laterality Date  . Hospitalization  02/2011    vasovagal presyncope, some NSVT on tele  . Nuclear stress echo  02/2011    no ischemia/infarct, normal LV wall motion, EF 68%  .  Colonoscopy  05/2011    prolapse type polyp, rpt due 10 yrs   History  Substance Use Topics  . Smoking status: Former Smoker -- 0.50 packs/day for 18 years    Types: Cigarettes    Quit date: 01/19/1986  . Smokeless tobacco: Never Used  . Alcohol Use: No   Family History  Problem Relation Age of Onset  . Hypertension Mother   . Diabetes Mother   . Stroke Mother   . Kidney disease Mother     failure-dialysis  . Heart disease Mother     MI  . Heart disease Other     CHF, CABG  . Cancer Other     lung  . Colon cancer Neg Hx    Allergies  Allergen Reactions  . Penicillins Other (See Comments)    REACTION: tingling, nausea, dizzyness, rash   Current Outpatient Prescriptions on File Prior to Visit  Medication Sig Dispense Refill  . aspirin EC 325 MG tablet Take 325 mg by mouth daily.      . Cyanocobalamin (VITAMIN B-12 CR) 1000 MCG TBCR Take 1 tablet by mouth daily.       Marland Kitchen glucose blood (CHOICE DM FORA G20 TEST STRIPS) test strip 1 each by Other route daily as needed. Patient's choice       . glucose blood (FREESTYLE LITE) test strip Check daily and as needed.  250.00.  50 each  11  .  Lancets MISC Use to check blood sugar daily       . vitamin C (ASCORBIC ACID) 500 MG tablet Take 500 mg by mouth daily.         No current facility-administered medications on file prior to visit.     Review of Systems  Constitutional: Negative for fever, chills, activity change, appetite change, fatigue and unexpected weight change.  HENT: Negative for hearing loss and neck pain.   Eyes: Negative for visual disturbance.  Respiratory: Negative for cough, chest tightness, shortness of breath and wheezing.   Cardiovascular: Negative for chest pain, palpitations and leg swelling.  Gastrointestinal: Negative for nausea, vomiting, abdominal pain, diarrhea, constipation, blood in stool and abdominal distention.  Genitourinary: Negative for hematuria and difficulty urinating.  Musculoskeletal:  Negative for myalgias and arthralgias.  Skin: Negative for rash.  Neurological: Negative for dizziness, seizures, syncope and headaches.  Hematological: Bruises/bleeds easily.  Psychiatric/Behavioral: Negative for dysphoric mood. The patient is not nervous/anxious.        Objective:   Physical Exam  Nursing note and vitals reviewed. Constitutional: He is oriented to person, place, and time. He appears well-developed and well-nourished. No distress.  HENT:  Head: Normocephalic and atraumatic.  Right Ear: Hearing, tympanic membrane and external ear normal.  Left Ear: Hearing, tympanic membrane, external ear and ear canal normal.  Nose: Nose normal.  Mouth/Throat: Oropharynx is clear and moist. No oropharyngeal exudate.  Eyes: Conjunctivae and EOM are normal. Pupils are equal, round, and reactive to light. No scleral icterus.  Neck: Normal range of motion. Neck supple. Carotid bruit is not present.  Cardiovascular: Normal rate, regular rhythm, normal heart sounds and intact distal pulses.   No murmur heard. Pulses:      Radial pulses are 2+ on the right side, and 2+ on the left side.  Pulmonary/Chest: Effort normal and breath sounds normal. No respiratory distress. He has no wheezes. He has no rales.  Abdominal: Soft. Bowel sounds are normal. He exhibits no distension and no mass. There is no tenderness. There is no rebound and no guarding.  No abd/renal bruits  Genitourinary: Rectum normal and prostate normal. Rectal exam shows no external hemorrhoid, no internal hemorrhoid, no fissure, no mass, no tenderness and anal tone normal. Prostate is not enlarged (15g) and not tender.  Musculoskeletal: Normal range of motion. He exhibits no edema.  Lymphadenopathy:    He has no cervical adenopathy.  Neurological: He is alert and oriented to person, place, and time.  CN grossly intact, station and gait intact  Skin: Skin is warm and dry. No rash noted.  Psychiatric: He has a normal mood and  affect. His behavior is normal. Judgment and thought content normal.       Assessment & Plan:

## 2012-04-18 NOTE — Assessment & Plan Note (Signed)
Congratulated on continued good control. Discussed actos and bladder cancer association. States has been on 45mg  for about 4-5 yrs. Discussed slow taper off starting next visit.  Will likely recommend start januvia.

## 2012-05-13 ENCOUNTER — Other Ambulatory Visit: Payer: Self-pay | Admitting: Family Medicine

## 2012-07-28 ENCOUNTER — Other Ambulatory Visit: Payer: Self-pay

## 2012-10-18 ENCOUNTER — Encounter: Payer: Self-pay | Admitting: Family Medicine

## 2012-10-18 ENCOUNTER — Ambulatory Visit (INDEPENDENT_AMBULATORY_CARE_PROVIDER_SITE_OTHER): Payer: PRIVATE HEALTH INSURANCE | Admitting: Family Medicine

## 2012-10-18 VITALS — BP 112/74 | HR 68 | Temp 97.7°F | Wt 168.8 lb

## 2012-10-18 DIAGNOSIS — Z23 Encounter for immunization: Secondary | ICD-10-CM

## 2012-10-18 DIAGNOSIS — Z2911 Encounter for prophylactic immunotherapy for respiratory syncytial virus (RSV): Secondary | ICD-10-CM

## 2012-10-18 DIAGNOSIS — E785 Hyperlipidemia, unspecified: Secondary | ICD-10-CM

## 2012-10-18 DIAGNOSIS — E119 Type 2 diabetes mellitus without complications: Secondary | ICD-10-CM

## 2012-10-18 MED ORDER — SIMVASTATIN 40 MG PO TABS: 40.0000 mg | ORAL_TABLET | Freq: Every day | ORAL | Status: DC

## 2012-10-18 MED ORDER — LISINOPRIL 2.5 MG PO TABS: 2.5000 mg | ORAL_TABLET | Freq: Every day | ORAL | Status: DC

## 2012-10-18 MED ORDER — METFORMIN HCL 1000 MG PO TABS: 1000.0000 mg | ORAL_TABLET | Freq: Two times a day (BID) | ORAL | Status: DC

## 2012-10-18 MED ORDER — PIOGLITAZONE HCL 15 MG PO TABS: 15.0000 mg | ORAL_TABLET | Freq: Every day | ORAL | Status: DC

## 2012-10-18 MED ORDER — GLIPIZIDE 10 MG PO TABS: ORAL_TABLET | ORAL | Status: DC

## 2012-10-18 NOTE — Assessment & Plan Note (Signed)
Again reviewed actos use - has been on for 5 years Will slowly taper off - decrease actos to 15mg  daily, discussed increase of glipizide to 10mg  at night if am sugars trending up. rtc 3 mo for blood work, reassess then. Pt agrees with plan. Declines flu and pneumovax today.

## 2012-10-18 NOTE — Addendum Note (Signed)
Addended by: Josph Macho A on: 10/18/2012 09:01 AM   Modules accepted: Orders

## 2012-10-18 NOTE — Patient Instructions (Addendum)
Let's decrease pioglitazone to 15mg  in the morning.   If you notice fasting sugars increasing, may increase glipizide to 10mg  twice daily. Return in 3 months for lab visit to check sugar control and in 6 months for physical. Shingles shot today. Think about pneumonia shot.

## 2012-10-18 NOTE — Progress Notes (Signed)
  Subjective:    Patient ID: Eduardo Pierce, male    DOB: 26-Feb-1950, 62 y.o.   MRN: [id number]  HPI CC: 6 mo f/u  DM - checks sugars at least QOD, averaging 90-110 fasting.  Compliant with meds. Last eye exam 11/2011. On actos 45 mg for last 4-5 years.  Has eye exam schedule for this November.   Lab Results  Component Value Date   HGBA1C 6.7* 04/11/2012     HLD - simvastatin, tolerating well.   HTN - compliant with lisinopril. Never had high blood pressure. Takes more for renoprotection in h/o diabetes.  Would like shingles shot today. Declines pneumonia shot. Declines flu shot. Td 2006   Past Medical History  Diagnosis Date  . Diabetes mellitus type II 10/20/1995  . Hyperlipidemia 02/20/1996  . Leukopenia 03/2011    mild, normal periph smear, normal B12 and folate     Review of Systems Per HPI    Objective:   Physical Exam  Nursing note and vitals reviewed. Constitutional: He appears well-developed and well-nourished. No distress.  HENT:  Mouth/Throat: Oropharynx is clear and moist. No oropharyngeal exudate.  Eyes: Conjunctivae and EOM are normal. Pupils are equal, round, and reactive to light.  Cardiovascular: Normal rate, regular rhythm, normal heart sounds and intact distal pulses.   No murmur heard. Pulmonary/Chest: Effort normal and breath sounds normal. No respiratory distress. He has no wheezes. He has no rales.  Musculoskeletal: He exhibits no edema.       Assessment & Plan:

## 2012-11-24 ENCOUNTER — Other Ambulatory Visit: Payer: Self-pay

## 2013-01-19 LAB — HM DIABETES EYE EXAM

## 2013-01-23 ENCOUNTER — Other Ambulatory Visit (INDEPENDENT_AMBULATORY_CARE_PROVIDER_SITE_OTHER): Payer: PRIVATE HEALTH INSURANCE

## 2013-01-23 DIAGNOSIS — E119 Type 2 diabetes mellitus without complications: Secondary | ICD-10-CM

## 2013-01-23 LAB — BASIC METABOLIC PANEL
BUN: 13 mg/dL (ref 6–23)
CHLORIDE: 102 meq/L (ref 96–112)
CO2: 28 meq/L (ref 19–32)
Calcium: 9.1 mg/dL (ref 8.4–10.5)
Creatinine, Ser: 1.2 mg/dL (ref 0.4–1.5)
GFR: 64.36 mL/min (ref >60.00)
Glucose, Bld: 206 mg/dL — ABNORMAL HIGH (ref 70–99)
Potassium: 4.2 mEq/L (ref 3.5–5.1)
Sodium: 137 mEq/L (ref 135–145)

## 2013-01-23 LAB — HEMOGLOBIN A1C: Hgb A1c MFr Bld: 7.4 % — ABNORMAL HIGH (ref 4.6–6.5)

## 2013-01-24 ENCOUNTER — Telehealth: Payer: Self-pay

## 2013-01-24 MED ORDER — GLIPIZIDE 10 MG PO TABS: ORAL_TABLET | ORAL | Status: DC

## 2013-01-24 NOTE — Telephone Encounter (Signed)
Patient notified

## 2013-01-24 NOTE — Telephone Encounter (Signed)
Dr Danise Mina, pt left v/m that he has not increased Glipizide 10 mg taking twice a day. Pt is presently taking Glipizide 10 mg in AM and 5 mg in PM. Pt can be reached at [phone]. Ozzie Hoyle LPN

## 2013-01-24 NOTE — Telephone Encounter (Signed)
plz ask him to increase glipizide to 10mg  bid and will reassess sugar control at next visit.

## 2013-03-24 NOTE — Telephone Encounter (Signed)
Pt is changing pharmacy to [place]; advised pt he can have [facility] contact [facility] and glipizide rx will be transferred. Pt voiced understanding.

## 2013-04-05 IMAGING — CR DG CHEST 1V PORT
1 series · 1 of 1 positions shown · non-contrast
Comparison: None.

CLINICAL DATA: Weakness and nausea.

PORTABLE CHEST - 1 VIEW

[AP]
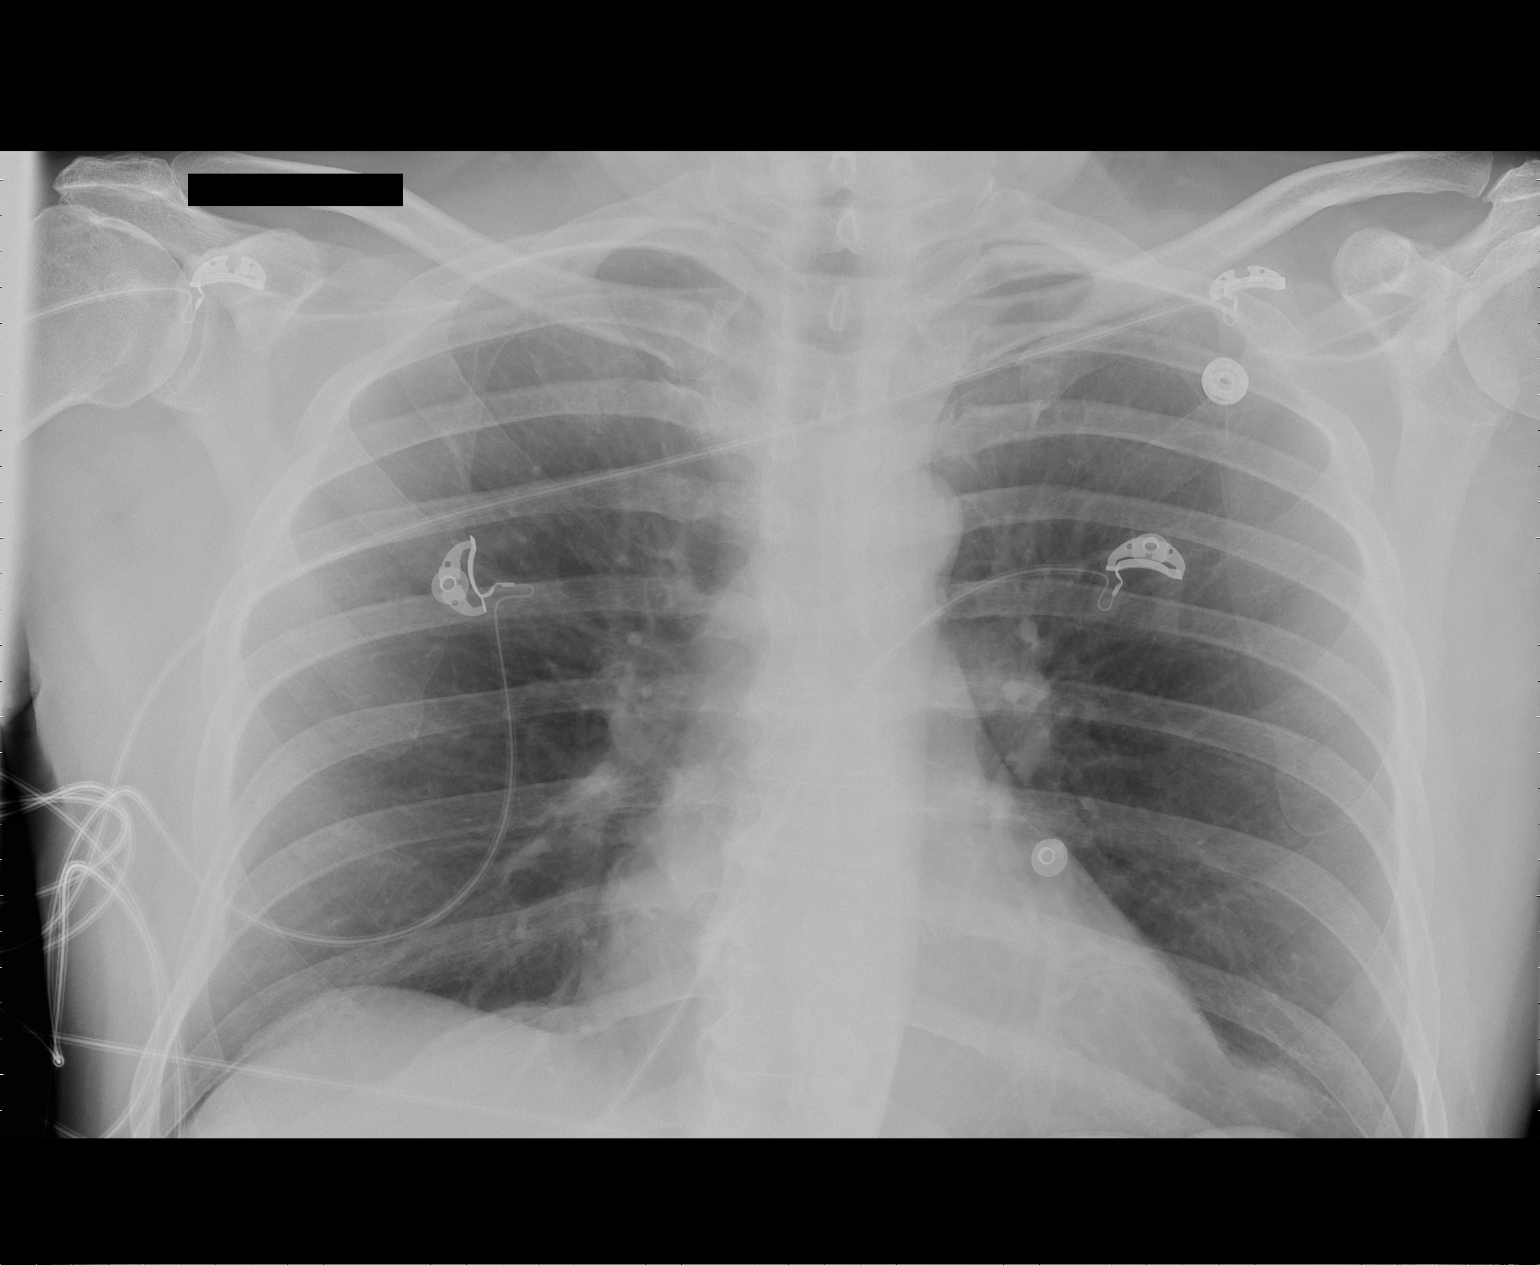

[1 of 1 positions shown; findings below may reference images not displayed]

FINDINGS: The heart size is normal.  The lungs are clear.  The
visualized soft tissues and bony thorax are unremarkable.
IMPRESSION: Negative chest.

## 2013-04-19 ENCOUNTER — Encounter: Payer: Self-pay | Admitting: Family Medicine

## 2013-04-19 ENCOUNTER — Ambulatory Visit (INDEPENDENT_AMBULATORY_CARE_PROVIDER_SITE_OTHER): Payer: PRIVATE HEALTH INSURANCE | Admitting: Family Medicine

## 2013-04-19 VITALS — BP 116/80 | HR 74 | Temp 97.8°F | Ht 68.25 in | Wt 161.5 lb

## 2013-04-19 DIAGNOSIS — E785 Hyperlipidemia, unspecified: Secondary | ICD-10-CM

## 2013-04-19 DIAGNOSIS — Z0000 Encounter for general adult medical examination without abnormal findings: Secondary | ICD-10-CM

## 2013-04-19 DIAGNOSIS — Z5181 Encounter for therapeutic drug level monitoring: Secondary | ICD-10-CM

## 2013-04-19 DIAGNOSIS — D72819 Decreased white blood cell count, unspecified: Secondary | ICD-10-CM

## 2013-04-19 DIAGNOSIS — E119 Type 2 diabetes mellitus without complications: Secondary | ICD-10-CM

## 2013-04-19 LAB — POCT URINALYSIS DIPSTICK
Bilirubin, UA: NEGATIVE
Blood, UA: NEGATIVE
Glucose, UA: NEGATIVE
Ketones, UA: NEGATIVE
Leukocytes, UA: NEGATIVE
Nitrite, UA: NEGATIVE
Protein, UA: NORMAL
Spec Grav, UA: 1.02
Urobilinogen, UA: 0.2
pH, UA: 6

## 2013-04-19 LAB — MICROALBUMIN / CREATININE URINE RATIO
Creatinine,U: 112.3 mg/dL
Microalb Creat Ratio: 0.2 mg/g (ref 0.0–30.0)
Microalb, Ur: 0.2 mg/dL (ref 0.0–1.9)

## 2013-04-19 MED ORDER — GLUCOSE BLOOD VI STRP: ORAL_STRIP | Status: DC

## 2013-04-19 NOTE — Assessment & Plan Note (Addendum)
Preventative protocols reviewed and updated unless pt declined. Discussed healthy diet and lifestyle. Reviewed blood work. Check PSA/DRE next visit, spaced out to Q2 yrs.

## 2013-04-19 NOTE — Assessment & Plan Note (Signed)
Chronic, stable. Check FLP next visit.

## 2013-04-19 NOTE — Assessment & Plan Note (Addendum)
A1c elevated last check - too soon to recheck today.  Will return next week for fasting blood work. Check Umicroalb as well as UA given longterm actos use to eval for hematuria. Declines pneumonia shot.

## 2013-04-19 NOTE — Progress Notes (Signed)
Pre visit review using our clinic review tool, if applicable. No additional management support is needed unless otherwise documented below in the visit note. 

## 2013-04-19 NOTE — Progress Notes (Signed)
BP 116/80  Pulse 74  Temp(Src) 97.8 F (36.6 C) (Oral)  Ht 5' 8.25" (1.734 m)  Wt 161 lb 8 oz (73.256 kg)  BMI 24.36 kg/m2  SpO2 98%   CC: CPE   Subjective:    Patient ID: Eduardo Pierce, male    DOB: Jul 09, 1950, 63 y.o.   MRN: [id number]  HPI: Eduardo Pierce is a 63 y.o. male presenting on 04/19/2013 for Annual Exam   Retired last June.  DM - recently increase glipizide to 10mg  twice daily.  Checks sugars fasting in am - 90-130.  longterm actos use. HLD - tolerating simvastatin without myalgias.  Fasting today for possible blood work.  "Eduardo Pierce"  Hobbies mgf rep pumps Married lives wife Retired 2014 3 children, 1 out of home, 1 grandchild Activity: Exercises at [employer] 2-3 times per week - aerobic Diet: good water, fruits/vegetables daily. Sugar free diet  Preventative: Colon screening - 06/08/2011. Prolapse type polyp, rpt due 10 yrs Eduardo Pierce).  Prostate - normal PSA today (0.25). Normal DRE today. May space out to Q2 yrs.  Pneumonia - declines.  Influenza - declines. Td - 2006. zostavax 09/2012  Lab Results  Component Value Date   PSA 0.25 04/11/2012   PSA 0.22 04/13/2011   PSA 0.23 03/31/2010    Relevant past medical, surgical, family and social history reviewed and updated as indicated.  Allergies and medications reviewed and updated. Current Outpatient Prescriptions on File Prior to Visit  Medication Sig  . aspirin EC 81 MG tablet Take 81 mg by mouth daily.  . Cyanocobalamin (VITAMIN B-12 CR) 1000 MCG TBCR Take 1 tablet by mouth daily.   Marland Kitchen glipiZIDE (GLUCOTROL) 10 MG tablet Take one in the morning and one at bedtime  . Lancets MISC Use to check blood sugar daily   . lisinopril (PRINIVIL,ZESTRIL) 2.5 MG tablet Take 1 tablet (2.5 mg total) by mouth daily.  . metFORMIN (GLUCOPHAGE) 1000 MG tablet Take 1 tablet (1,000 mg total) by mouth 2 (two) times daily.  . pioglitazone (ACTOS) 15 MG tablet Take 1 tablet (15 mg total) by mouth daily.  . simvastatin (ZOCOR) 40 MG  tablet Take 1 tablet (40 mg total) by mouth at bedtime.  . vitamin C (ASCORBIC ACID) 500 MG tablet Take 500 mg by mouth daily.     No current facility-administered medications on file prior to visit.    Review of Systems  Constitutional: Negative for fever, chills, activity change, appetite change, fatigue and unexpected weight change.  HENT: Negative for hearing loss.   Eyes: Negative for visual disturbance.  Respiratory: Negative for cough, chest tightness, shortness of breath and wheezing.   Cardiovascular: Negative for chest pain, palpitations and leg swelling.  Gastrointestinal: Negative for nausea, vomiting, abdominal pain, diarrhea, constipation, blood in stool and abdominal distention.  Genitourinary: Negative for hematuria and difficulty urinating.  Musculoskeletal: Negative for arthralgias, myalgias and neck pain.  Skin: Negative for rash.  Neurological: Negative for dizziness, seizures, syncope and headaches.  Hematological: Negative for adenopathy. Does not bruise/bleed easily.  Psychiatric/Behavioral: Negative for dysphoric mood. The patient is not nervous/anxious.    Per HPI unless specifically indicated above    Objective:    BP 116/80  Pulse 74  Temp(Src) 97.8 F (36.6 C) (Oral)  Ht 5' 8.25" (1.734 m)  Wt 161 lb 8 oz (73.256 kg)  BMI 24.36 kg/m2  SpO2 98%  Physical Exam  Nursing note and vitals reviewed. Constitutional: He is oriented to person, place, and time. He appears  well-developed and well-nourished. No distress.  HENT:  Head: Normocephalic and atraumatic.  Right Ear: Hearing, tympanic membrane, external ear and ear canal normal.  Left Ear: Hearing, tympanic membrane, external ear and ear canal normal.  Nose: Nose normal.  Mouth/Throat: Uvula is midline, oropharynx is clear and moist and mucous membranes are normal. No oropharyngeal exudate, posterior oropharyngeal edema or posterior oropharyngeal erythema.  Eyes: Conjunctivae and EOM are normal. Pupils  are equal, round, and reactive to light. No scleral icterus.  Neck: Normal range of motion. Neck supple. No thyromegaly present.  Cardiovascular: Normal rate, regular rhythm, normal heart sounds and intact distal pulses.   No murmur heard. Pulses:      Radial pulses are 2+ on the right side, and 2+ on the left side.  Pulmonary/Chest: Effort normal and breath sounds normal. No respiratory distress. He has no wheezes. He has no rales.  Abdominal: Soft. Bowel sounds are normal. He exhibits no distension and no mass. There is no tenderness. There is no rebound and no guarding.  Musculoskeletal: Normal range of motion. He exhibits no edema.  Lymphadenopathy:    He has no cervical adenopathy.  Neurological: He is alert and oriented to person, place, and time.  CN grossly intact, station and gait intact  Skin: Skin is warm and dry. No rash noted.  Psychiatric: He has a normal mood and affect. His behavior is normal. Judgment and thought content normal.   Results for orders placed in visit on 73/71/06  BASIC METABOLIC PANEL      Result Value Ref Range   Sodium 137  135 - 145 mEq/L   Potassium 4.2  3.5 - 5.1 mEq/L   Chloride 102  96 - 112 mEq/L   CO2 28  19 - 32 mEq/L   Glucose, Bld 206 (*) 70 - 99 mg/dL   BUN 13  6 - 23 mg/dL   Creatinine, Ser 1.2  0.4 - 1.5 mg/dL   Calcium 9.1  8.4 - 10.5 mg/dL   GFR 64.36  >60.00 mL/min  HEMOGLOBIN A1C      Result Value Ref Range   Hemoglobin A1C 7.4 (*) 4.6 - 6.5 %      Assessment & Plan:   Problem List Items Addressed This Visit   Diabetes type 2, controlled     A1c elevated last check - too soon to recheck today.  Will return next week for fasting blood work. Check Umicroalb as well as UA given longterm actos use to eval for hematuria. Declines pneumonia shot.    Relevant Orders      Microalbumin / creatinine urine ratio      Basic metabolic panel      Hemoglobin A1c   HYPERLIPIDEMIA     Chronic, stable. Check FLP next visit.    Relevant  Orders      Lipid panel   Leukopenia     recheck CBC.    Relevant Orders      CBC with Differential   Healthcare maintenance - Primary     Preventative protocols reviewed and updated unless pt declined. Discussed healthy diet and lifestyle. Reviewed blood work. Check PSA/DRE next visit, spaced out to Q2 yrs.     Other Visit Diagnoses   Therapeutic drug monitoring            Follow up plan: Return in about 6 months (around 10/19/2013), or as needed, for diabetes follow up.

## 2013-04-19 NOTE — Assessment & Plan Note (Signed)
-   recheck CBC

## 2013-04-19 NOTE — Addendum Note (Signed)
Addended by: Lurlean Nanny on: 04/19/2013 01:40 PM   Modules accepted: Orders

## 2013-04-19 NOTE — Patient Instructions (Addendum)
Return next week fasting for blood work Let's check urine today. Continue meds as up to now. Return in 6 months for diabetes follow up. Good to see you today, call us with quesitons.

## 2013-04-28 ENCOUNTER — Other Ambulatory Visit (INDEPENDENT_AMBULATORY_CARE_PROVIDER_SITE_OTHER): Payer: PRIVATE HEALTH INSURANCE

## 2013-04-28 DIAGNOSIS — E785 Hyperlipidemia, unspecified: Secondary | ICD-10-CM

## 2013-04-28 DIAGNOSIS — D72819 Decreased white blood cell count, unspecified: Secondary | ICD-10-CM

## 2013-04-28 DIAGNOSIS — E119 Type 2 diabetes mellitus without complications: Secondary | ICD-10-CM

## 2013-04-28 LAB — LIPID PANEL
CHOLESTEROL: 108 mg/dL (ref 0–200)
HDL: 43.1 mg/dL (ref >39.00)
LDL Cholesterol: 53 mg/dL (ref 0–99)
Total CHOL/HDL Ratio: 3
Triglycerides: 61 mg/dL (ref 0.0–149.0)
VLDL: 12.2 mg/dL (ref 0.0–40.0)

## 2013-04-28 LAB — CBC WITH DIFFERENTIAL/PLATELET
Basophils Absolute: 0.1 10*3/uL (ref 0.0–0.1)
Basophils Relative: 1.3 % (ref 0.0–3.0)
EOS PCT: 3.3 % (ref 0.0–5.0)
Eosinophils Absolute: 0.1 10*3/uL (ref 0.0–0.7)
HCT: 41.3 % (ref 39.0–52.0)
HEMOGLOBIN: 14 g/dL (ref 13.0–17.0)
LYMPHS PCT: 20.2 % (ref 12.0–46.0)
Lymphs Abs: 0.8 10*3/uL (ref 0.7–4.0)
MCHC: 33.8 g/dL (ref 30.0–36.0)
MCV: 95.5 fl (ref 78.0–100.0)
Monocytes Absolute: 0.4 10*3/uL (ref 0.1–1.0)
Monocytes Relative: 11.1 % (ref 3.0–12.0)
NEUTROS PCT: 64.1 % (ref 43.0–77.0)
Neutro Abs: 2.6 10*3/uL (ref 1.4–7.7)
Platelets: 181 10*3/uL (ref 150.0–400.0)
RBC: 4.33 Mil/uL (ref 4.22–5.81)
RDW: 13.8 % (ref 11.5–14.6)
WBC: 4 10*3/uL — ABNORMAL LOW (ref 4.5–10.5)

## 2013-04-28 LAB — BASIC METABOLIC PANEL
BUN: 18 mg/dL (ref 6–23)
CHLORIDE: 106 meq/L (ref 96–112)
CO2: 28 mEq/L (ref 19–32)
Calcium: 9.2 mg/dL (ref 8.4–10.5)
Creatinine, Ser: 1 mg/dL (ref 0.4–1.5)
GFR: 79.22 mL/min (ref >60.00)
GLUCOSE: 115 mg/dL — AB (ref 70–99)
POTASSIUM: 4.5 meq/L (ref 3.5–5.1)
Sodium: 139 mEq/L (ref 135–145)

## 2013-04-28 LAB — HEMOGLOBIN A1C: HEMOGLOBIN A1C: 7.2 % — AB (ref 4.6–6.5)

## 2013-05-03 ENCOUNTER — Telehealth: Payer: Self-pay

## 2013-05-03 NOTE — Telephone Encounter (Signed)
Pt having difficulty getting into mychart; pt request results of A1C. Pt notified as instructed from my chart result note. Pt voiced understanding.

## 2013-05-04 ENCOUNTER — Telehealth: Payer: Self-pay

## 2013-05-04 NOTE — Telephone Encounter (Signed)
Relevant patient education assigned to patient using Emmi. ° °

## 2013-10-10 ENCOUNTER — Telehealth: Payer: Self-pay

## 2013-10-10 DIAGNOSIS — E119 Type 2 diabetes mellitus without complications: Secondary | ICD-10-CM

## 2013-10-10 NOTE — Telephone Encounter (Signed)
Yes labwork ordered. Come in prior to appt.

## 2013-10-10 NOTE — Telephone Encounter (Signed)
Pt has lab appt scheduled for 10/19/13

## 2013-10-10 NOTE — Telephone Encounter (Signed)
Pt left v/m; pt has 6 mth f/u appt on 10/24/13 at 8:30 am and pt wants to know if needs to have labs prior to appt.Please advise.

## 2013-10-19 ENCOUNTER — Other Ambulatory Visit (INDEPENDENT_AMBULATORY_CARE_PROVIDER_SITE_OTHER): Payer: PRIVATE HEALTH INSURANCE

## 2013-10-19 DIAGNOSIS — E119 Type 2 diabetes mellitus without complications: Secondary | ICD-10-CM

## 2013-10-19 LAB — BASIC METABOLIC PANEL
BUN: 17 mg/dL (ref 6–23)
CALCIUM: 8.8 mg/dL (ref 8.4–10.5)
CHLORIDE: 104 meq/L (ref 96–112)
CO2: 30 mEq/L (ref 19–32)
Creatinine, Ser: 1.2 mg/dL (ref 0.4–1.5)
GFR: 66.75 mL/min (ref >60.00)
Glucose, Bld: 154 mg/dL — ABNORMAL HIGH (ref 70–99)
Potassium: 3.8 mEq/L (ref 3.5–5.1)
Sodium: 138 mEq/L (ref 135–145)

## 2013-10-19 LAB — HEMOGLOBIN A1C: Hgb A1c MFr Bld: 7 % — ABNORMAL HIGH (ref 4.6–6.5)

## 2013-10-20 ENCOUNTER — Telehealth: Payer: Self-pay

## 2013-10-20 NOTE — Telephone Encounter (Signed)
plz notify A1c returned improved at 7%. kidneys were stable. We will review this in detail at appointment next week.

## 2013-10-20 NOTE — Telephone Encounter (Signed)
Pt request cb at [phone] when 10/19/13 test results are available.

## 2013-10-24 ENCOUNTER — Encounter: Payer: Self-pay | Admitting: Family Medicine

## 2013-10-24 ENCOUNTER — Ambulatory Visit (INDEPENDENT_AMBULATORY_CARE_PROVIDER_SITE_OTHER): Payer: PRIVATE HEALTH INSURANCE | Admitting: Family Medicine

## 2013-10-24 VITALS — BP 110/60 | HR 76 | Temp 97.9°F | Wt 165.5 lb

## 2013-10-24 DIAGNOSIS — E119 Type 2 diabetes mellitus without complications: Secondary | ICD-10-CM

## 2013-10-24 DIAGNOSIS — E785 Hyperlipidemia, unspecified: Secondary | ICD-10-CM

## 2013-10-24 MED ORDER — METFORMIN HCL 1000 MG PO TABS: 1000.0000 mg | ORAL_TABLET | Freq: Two times a day (BID) | ORAL | Status: DC

## 2013-10-24 MED ORDER — GLIPIZIDE 10 MG PO TABS: ORAL_TABLET | ORAL | Status: DC

## 2013-10-24 MED ORDER — SIMVASTATIN 40 MG PO TABS: 40.0000 mg | ORAL_TABLET | Freq: Every day | ORAL | Status: DC

## 2013-10-24 MED ORDER — LISINOPRIL 2.5 MG PO TABS: 2.5000 mg | ORAL_TABLET | Freq: Every day | ORAL | Status: DC

## 2013-10-24 MED ORDER — PIOGLITAZONE HCL 15 MG PO TABS: 15.0000 mg | ORAL_TABLET | Freq: Every day | ORAL | Status: DC

## 2013-10-24 NOTE — Assessment & Plan Note (Signed)
Chronic, stable. Continue meds. No changes indicated today. Declines pneumonia shot today.

## 2013-10-24 NOTE — Assessment & Plan Note (Signed)
Chronic, stable. Continue statin.  

## 2013-10-24 NOTE — Progress Notes (Signed)
BP 110/60  Pulse 76  Temp(Src) 97.9 F (36.6 C) (Oral)  Wt 165 lb 8 oz (75.07 kg)   CC: 6 mo f/u  Subjective:    Patient ID: Eduardo Pierce, male    DOB: 1950/10/16, 63 y.o.   MRN: [id number]  HPI: Eduardo Pierce is a 63 y.o. male presenting on 10/24/2013 for Follow-up   DM - regularly does check sugars every other day - 90-130.  Compliant with antihyperglycemic regimen which includes: glipizide 10mg  bid and metformin 1000mg  bid, actos 15mg .  Denies low sugars or hypoglycemic symptoms.  Denies paresthesias. Last diabetic eye exam 01/2013.  Pneumovax: not done.  Prevnar: not done. Will consider pneumonia shot. On lisinopril 2.5mg  daily as well. Lab Results  Component Value Date   HGBA1C 7.0* 10/19/2013    HLD - tolerating simvastatin without myalgias. Lab Results  Component Value Date   LDLCALC 53 04/28/2013    Relevant past medical, surgical, family and social history reviewed and updated as indicated.  Allergies and medications reviewed and updated. Current Outpatient Prescriptions on File Prior to Visit  Medication Sig  . aspirin EC 81 MG tablet Take 81 mg by mouth daily.  . Cyanocobalamin (VITAMIN B-12 CR) 1000 MCG TBCR Take 1 tablet by mouth daily.   Marland Kitchen glucose blood (FREESTYLE LITE) test strip CHECK DAILY AND AS NEEDED 250.02  . Lancets MISC Use to check blood sugar daily   . vitamin C (ASCORBIC ACID) 500 MG tablet Take 500 mg by mouth daily.     No current facility-administered medications on file prior to visit.    Review of Systems Per HPI unless specifically indicated above    Objective:    BP 110/60  Pulse 76  Temp(Src) 97.9 F (36.6 C) (Oral)  Wt 165 lb 8 oz (75.07 kg)  Physical Exam  Nursing note and vitals reviewed. Constitutional: He appears well-developed and well-nourished. No distress.  HENT:  Head: Normocephalic and atraumatic.  Right Ear: External ear normal.  Left Ear: External ear normal.  Nose: Nose normal.  Mouth/Throat: Oropharynx is  clear and moist. No oropharyngeal exudate.  Eyes: Conjunctivae and EOM are normal. Pupils are equal, round, and reactive to light. No scleral icterus.  Neck: Normal range of motion. Neck supple.  Cardiovascular: Normal rate, regular rhythm, normal heart sounds and intact distal pulses.   No murmur heard. Pulmonary/Chest: Effort normal and breath sounds normal. No respiratory distress. He has no wheezes. He has no rales.  Musculoskeletal: He exhibits no edema.  Diabetic foot exam: Normal inspection No skin breakdown No calluses  Normal DP/PT pulses Normal sensation to light tough and monofilament Nails normal  Lymphadenopathy:    He has no cervical adenopathy.  Skin: Skin is warm and dry. No rash noted.  Psychiatric: He has a normal mood and affect.   Results for orders placed in visit on 10/24/13  HM DIABETES EYE EXAM      Result Value Ref Range   HM Diabetic Eye Exam No Retinopathy  No Retinopathy      Assessment & Plan:   Problem List Items Addressed This Visit   HLD (hyperlipidemia)     Chronic, stable. Continue statin.    Relevant Medications      simvastatin (ZOCOR) tablet      lisinopril (PRINIVIL,ZESTRIL) tablet   Diabetes type 2, controlled - Primary     Chronic, stable. Continue meds. No changes indicated today. Declines pneumonia shot today.    Relevant Medications  pioglitazone (ACTOS) tablet      metFORMIN (GLUCOPHAGE) tablet      glipiZIDE (GLUCOTROL) tablet      simvastatin (ZOCOR) tablet      lisinopril (PRINIVIL,ZESTRIL) tablet   Other Relevant Orders      HM DIABETES FOOT EXAM (Completed)       Follow up plan: Return in about 6 months (around 04/25/2014), or if symptoms worsen or fail to improve, for annual exam, prior fasting for blood work.

## 2013-10-24 NOTE — Telephone Encounter (Signed)
Discussed at visit today.

## 2013-10-24 NOTE — Progress Notes (Signed)
Pre visit review using our clinic review tool, if applicable. No additional management support is needed unless otherwise documented below in the visit note. 

## 2013-10-24 NOTE — Patient Instructions (Signed)
I think you're doing well. Continue meds as up to now. Return as needed or in 6 months for next physical. Think about pneumonia shot.

## 2014-03-07 LAB — HM DIABETES EYE EXAM

## 2014-03-13 ENCOUNTER — Encounter: Payer: Self-pay | Admitting: Family Medicine

## 2014-04-21 ENCOUNTER — Other Ambulatory Visit: Payer: Self-pay | Admitting: Family Medicine

## 2014-04-21 DIAGNOSIS — E785 Hyperlipidemia, unspecified: Secondary | ICD-10-CM

## 2014-04-21 DIAGNOSIS — D72819 Decreased white blood cell count, unspecified: Secondary | ICD-10-CM

## 2014-04-21 DIAGNOSIS — E119 Type 2 diabetes mellitus without complications: Secondary | ICD-10-CM

## 2014-04-21 DIAGNOSIS — Z125 Encounter for screening for malignant neoplasm of prostate: Secondary | ICD-10-CM

## 2014-04-23 ENCOUNTER — Other Ambulatory Visit (INDEPENDENT_AMBULATORY_CARE_PROVIDER_SITE_OTHER): Payer: PRIVATE HEALTH INSURANCE

## 2014-04-23 DIAGNOSIS — D72819 Decreased white blood cell count, unspecified: Secondary | ICD-10-CM | POA: Diagnosis not present

## 2014-04-23 DIAGNOSIS — Z125 Encounter for screening for malignant neoplasm of prostate: Secondary | ICD-10-CM | POA: Diagnosis not present

## 2014-04-23 DIAGNOSIS — E785 Hyperlipidemia, unspecified: Secondary | ICD-10-CM | POA: Diagnosis not present

## 2014-04-23 DIAGNOSIS — E119 Type 2 diabetes mellitus without complications: Secondary | ICD-10-CM | POA: Diagnosis not present

## 2014-04-23 LAB — CBC WITH DIFFERENTIAL/PLATELET
BASOS ABS: 0 10*3/uL (ref 0.0–0.1)
Basophils Relative: 0.8 % (ref 0.0–3.0)
Eosinophils Absolute: 0.3 10*3/uL (ref 0.0–0.7)
Eosinophils Relative: 4.4 % (ref 0.0–5.0)
HEMATOCRIT: 41.4 % (ref 39.0–52.0)
Hemoglobin: 14.4 g/dL (ref 13.0–17.0)
LYMPHS PCT: 16.5 % (ref 12.0–46.0)
Lymphs Abs: 0.9 10*3/uL (ref 0.7–4.0)
MCHC: 34.9 g/dL (ref 30.0–36.0)
MCV: 92.9 fl (ref 78.0–100.0)
Monocytes Absolute: 0.5 10*3/uL (ref 0.1–1.0)
Monocytes Relative: 9 % (ref 3.0–12.0)
NEUTROS ABS: 4 10*3/uL (ref 1.4–7.7)
Neutrophils Relative %: 69.3 % (ref 43.0–77.0)
Platelets: 170 10*3/uL (ref 150.0–400.0)
RBC: 4.45 Mil/uL (ref 4.22–5.81)
RDW: 13.7 % (ref 11.5–15.5)
WBC: 5.7 10*3/uL (ref 4.0–10.5)

## 2014-04-23 LAB — COMPREHENSIVE METABOLIC PANEL
ALT: 18 U/L (ref 0–53)
AST: 17 U/L (ref 0–37)
Albumin: 4.3 g/dL (ref 3.5–5.2)
Alkaline Phosphatase: 53 U/L (ref 39–117)
BUN: 18 mg/dL (ref 6–23)
CHLORIDE: 102 meq/L (ref 96–112)
CO2: 30 meq/L (ref 19–32)
Calcium: 9.4 mg/dL (ref 8.4–10.5)
Creatinine, Ser: 1.17 mg/dL (ref 0.40–1.50)
GFR: 66.64 mL/min (ref >60.00)
Glucose, Bld: 172 mg/dL — ABNORMAL HIGH (ref 70–99)
POTASSIUM: 4.4 meq/L (ref 3.5–5.1)
Sodium: 137 mEq/L (ref 135–145)
Total Bilirubin: 0.7 mg/dL (ref 0.2–1.2)
Total Protein: 6.5 g/dL (ref 6.0–8.3)

## 2014-04-23 LAB — LIPID PANEL
Cholesterol: 119 mg/dL (ref 0–200)
HDL: 42.2 mg/dL (ref >39.00)
LDL Cholesterol: 63 mg/dL (ref 0–99)
NonHDL: 76.8
TRIGLYCERIDES: 69 mg/dL (ref 0.0–149.0)
Total CHOL/HDL Ratio: 3
VLDL: 13.8 mg/dL (ref 0.0–40.0)

## 2014-04-23 LAB — MICROALBUMIN / CREATININE URINE RATIO
Creatinine,U: 163.8 mg/dL
Microalb Creat Ratio: 0.4 mg/g (ref 0.0–30.0)
Microalb, Ur: <0.7 mg/dL (ref 0.0–1.9)

## 2014-04-23 LAB — PSA, MEDICARE: PSA: 0.22 ng/mL (ref 0.10–4.00)

## 2014-04-23 LAB — HEMOGLOBIN A1C: Hgb A1c MFr Bld: 7.9 % — ABNORMAL HIGH (ref 4.6–6.5)

## 2014-04-27 ENCOUNTER — Telehealth: Payer: Self-pay

## 2014-04-27 NOTE — Telephone Encounter (Signed)
Pt left v/m requesting cb about 04/23/14 lab results; spoke with Mrs Borin [facility] signed) and pt wants lab results prior to CPX on 04/30/14.Please advise.

## 2014-04-27 NOTE — Telephone Encounter (Signed)
All blood work normal except sugar elevated with A1c 7.9% which is too high. Will discuss at CPE May mail to patient if desired.

## 2014-04-27 NOTE — Telephone Encounter (Signed)
Message left with wife. She will notify patient.

## 2014-04-30 ENCOUNTER — Ambulatory Visit (INDEPENDENT_AMBULATORY_CARE_PROVIDER_SITE_OTHER): Payer: PRIVATE HEALTH INSURANCE | Admitting: Family Medicine

## 2014-04-30 ENCOUNTER — Encounter: Payer: Self-pay | Admitting: Family Medicine

## 2014-04-30 VITALS — BP 106/64 | HR 84 | Temp 98.0°F | Ht 67.8 in | Wt 161.5 lb

## 2014-04-30 DIAGNOSIS — Z0000 Encounter for general adult medical examination without abnormal findings: Secondary | ICD-10-CM | POA: Diagnosis not present

## 2014-04-30 DIAGNOSIS — D696 Thrombocytopenia, unspecified: Secondary | ICD-10-CM

## 2014-04-30 DIAGNOSIS — IMO0002 Reserved for concepts with insufficient information to code with codable children: Secondary | ICD-10-CM

## 2014-04-30 DIAGNOSIS — D72819 Decreased white blood cell count, unspecified: Secondary | ICD-10-CM

## 2014-04-30 DIAGNOSIS — E785 Hyperlipidemia, unspecified: Secondary | ICD-10-CM

## 2014-04-30 DIAGNOSIS — E1165 Type 2 diabetes mellitus with hyperglycemia: Secondary | ICD-10-CM

## 2014-04-30 MED ORDER — LISINOPRIL 2.5 MG PO TABS: 2.5000 mg | ORAL_TABLET | Freq: Every day | ORAL | Status: DC

## 2014-04-30 MED ORDER — GLIPIZIDE 10 MG PO TABS: ORAL_TABLET | ORAL | Status: DC

## 2014-04-30 MED ORDER — METFORMIN HCL 1000 MG PO TABS: 1000.0000 mg | ORAL_TABLET | Freq: Two times a day (BID) | ORAL | Status: DC

## 2014-04-30 MED ORDER — SITAGLIPTIN PHOSPHATE 50 MG PO TABS
50.0000 mg | ORAL_TABLET | Freq: Every day | ORAL | Status: DC
Start: 2014-04-30 — End: 2014-05-03

## 2014-04-30 MED ORDER — PIOGLITAZONE HCL 15 MG PO TABS
15.0000 mg | ORAL_TABLET | Freq: Every day | ORAL | Status: DC
Start: 2014-04-30 — End: 2015-04-15

## 2014-04-30 NOTE — Assessment & Plan Note (Signed)
Chronic, deteriorated. Continue current regimen. Add [place] 50mg  daily. Recheck in 3 months for follow up. Start checking sugars at different times throughout the day and bring log to next visit.

## 2014-04-30 NOTE — Assessment & Plan Note (Signed)
This has resolved as of recent labs.

## 2014-04-30 NOTE — Assessment & Plan Note (Signed)
Preventative protocols reviewed and updated unless pt declined. Discussed healthy diet and lifestyle.  

## 2014-04-30 NOTE — Patient Instructions (Addendum)
A1c was too high. Continue diabetes medicines as up to now, start [place] 50mg  daily. Start checking sugars at different times throughout the day and bring log to next visit. Return in 3 months for follow up, prior for labs.

## 2014-04-30 NOTE — Progress Notes (Signed)
Pre visit review using our clinic review tool, if applicable. No additional management support is needed unless otherwise documented below in the visit note. 

## 2014-04-30 NOTE — Progress Notes (Signed)
BP 106/64 mmHg  Pulse 84  Temp(Src) 98 F (36.7 C) (Oral)  Ht 5' 7.8" (1.722 m)  Wt 161 lb 8 oz (73.256 kg)  BMI 24.70 kg/m2   CC: CPE  Subjective:    Patient ID: Eduardo Pierce, male    DOB: May 23, 1950, 64 y.o.   MRN: [id number]  HPI: Eduardo Pierce is a 64 y.o. male presenting on 04/30/2014 for Annual Exam   DM - on max dose glipizide and metformin and also on actos 15mg  daily. On actos for several years now. Brings log of fasting sugars averaging 90-140s. Completed DSME.  Lab Results  Component Value Date   HGBA1C 7.9* 04/23/2014    Cystic lesion R 4th toe not tender but may be enlarging. May see ortho.  Preventative: Colon screening - 06/08/2011. Prolapse type polyp, rpt due 10 yrs Eduardo Pierce).  Prostate - normal PSA today (0.25). Yearly check.  Pneumonia - declines. Influenza - declines. Td - 2006. zostavax 09/2012.  Seat belt use discussed  "Eduardo Pierce"  Hobbies [employer] pumps Married lives wife Retired 2014 3 children, 1 out of home, 1 grandchild Activity: Exercises at [facility] 2-3 times per week - aerobic Diet: good water, fruits/vegetables daily. Sugar free diet  Relevant past medical, surgical, family and social history reviewed and updated as indicated. Interim medical history since our last visit reviewed. Allergies and medications reviewed and updated. Current Outpatient Prescriptions on File Prior to Visit  Medication Sig  . aspirin EC 81 MG tablet Take 81 mg by mouth daily.  . Cyanocobalamin (VITAMIN B-12 CR) 1000 MCG TBCR Take 1 tablet by mouth daily.   Marland Kitchen glucose blood (FREESTYLE LITE) test strip CHECK DAILY AND AS NEEDED 250.02  . Lancets MISC Use to check blood sugar daily   . vitamin C (ASCORBIC ACID) 500 MG tablet Take 500 mg by mouth daily.     No current facility-administered medications on file prior to visit.    Review of Systems  Constitutional: Negative for fever, chills, activity change, appetite change, fatigue and unexpected weight change.    HENT: Negative for hearing loss.   Eyes: Negative for visual disturbance.  Respiratory: Positive for cough (dry from cleaning out barn). Negative for chest tightness, shortness of breath and wheezing.   Cardiovascular: Negative for chest pain, palpitations and leg swelling.  Gastrointestinal: Negative for nausea, vomiting, abdominal pain, diarrhea, constipation, blood in stool and abdominal distention.  Genitourinary: Negative for hematuria and difficulty urinating.  Musculoskeletal: Negative for myalgias, arthralgias and neck pain.  Skin: Negative for rash.  Neurological: Negative for dizziness, seizures, syncope and headaches.  Hematological: Negative for adenopathy. Does not bruise/bleed easily.  Psychiatric/Behavioral: Negative for dysphoric mood. The patient is not nervous/anxious.    Per HPI unless specifically indicated above     Objective:    BP 106/64 mmHg  Pulse 84  Temp(Src) 98 F (36.7 C) (Oral)  Ht 5' 7.8" (1.722 m)  Wt 161 lb 8 oz (73.256 kg)  BMI 24.70 kg/m2  Wt Readings from Last 3 Encounters:  04/30/14 161 lb 8 oz (73.256 kg)  10/24/13 165 lb 8 oz (75.07 kg)  04/19/13 161 lb 8 oz (73.256 kg)    Physical Exam  Constitutional: He is oriented to person, place, and time. He appears well-developed and well-nourished. No distress.  HENT:  Head: Normocephalic and atraumatic.  Right Ear: Hearing, tympanic membrane, external ear and ear canal normal.  Left Ear: Hearing, tympanic membrane, external ear and ear canal normal.  Nose:  Nose normal.  Mouth/Throat: Uvula is midline, oropharynx is clear and moist and mucous membranes are normal. No oropharyngeal exudate, posterior oropharyngeal edema or posterior oropharyngeal erythema.  Eyes: Conjunctivae and EOM are normal. Pupils are equal, round, and reactive to light. No scleral icterus.  Neck: Normal range of motion. Neck supple. Carotid bruit is not present. No thyromegaly present.  Cardiovascular: Normal rate, regular  rhythm, normal heart sounds and intact distal pulses.   No murmur heard. Pulses:      Radial pulses are 2+ on the right side, and 2+ on the left side.  Pulmonary/Chest: Effort normal and breath sounds normal. No respiratory distress. He has no wheezes. He has no rales.  Abdominal: Soft. Bowel sounds are normal. He exhibits no distension and no mass. There is no tenderness. There is no rebound and no guarding.  Genitourinary: Rectum normal and prostate normal. Rectal exam shows no external hemorrhoid, no internal hemorrhoid, no fissure, no mass, no tenderness and anal tone normal. Prostate is not enlarged (15gm) and not tender.  Musculoskeletal: Normal range of motion. He exhibits no edema.  R 4th toe IP with nontender cystic lesion  Lymphadenopathy:    He has no cervical adenopathy.  Neurological: He is alert and oriented to person, place, and time.  CN grossly intact, station and gait intact  Skin: Skin is warm and dry. No rash noted.  Psychiatric: He has a normal mood and affect. His behavior is normal. Judgment and thought content normal.  Nursing note and vitals reviewed.  Results for orders placed or performed in visit on 04/23/14  CBC with Differential/Platelet  Result Value Ref Range   WBC 5.7 4.0 - 10.5 K/uL   RBC 4.45 4.22 - 5.81 Mil/uL   Hemoglobin 14.4 13.0 - 17.0 g/dL   HCT 41.4 39.0 - 52.0 %   MCV 92.9 78.0 - 100.0 fl   MCHC 34.9 30.0 - 36.0 g/dL   RDW 13.7 11.5 - 15.5 %   Platelets 170.0 150.0 - 400.0 K/uL   Neutrophils Relative % 69.3 43.0 - 77.0 %   Lymphocytes Relative 16.5 12.0 - 46.0 %   Monocytes Relative 9.0 3.0 - 12.0 %   Eosinophils Relative 4.4 0.0 - 5.0 %   Basophils Relative 0.8 0.0 - 3.0 %   Neutro Abs 4.0 1.4 - 7.7 K/uL   Lymphs Abs 0.9 0.7 - 4.0 K/uL   Monocytes Absolute 0.5 0.1 - 1.0 K/uL   Eosinophils Absolute 0.3 0.0 - 0.7 K/uL   Basophils Absolute 0.0 0.0 - 0.1 K/uL  PSA, Medicare  Result Value Ref Range   PSA 0.22 0.10 - 4.00 ng/ml    Comprehensive metabolic panel  Result Value Ref Range   Sodium 137 135 - 145 mEq/L   Potassium 4.4 3.5 - 5.1 mEq/L   Chloride 102 96 - 112 mEq/L   CO2 30 19 - 32 mEq/L   Glucose, Bld 172 (H) 70 - 99 mg/dL   BUN 18 6 - 23 mg/dL   Creatinine, Ser 1.17 0.40 - 1.50 mg/dL   Total Bilirubin 0.7 0.2 - 1.2 mg/dL   Alkaline Phosphatase 53 39 - 117 U/L   AST 17 0 - 37 U/L   ALT 18 0 - 53 U/L   Total Protein 6.5 6.0 - 8.3 g/dL   Albumin 4.3 3.5 - 5.2 g/dL   Calcium 9.4 8.4 - 10.5 mg/dL   GFR 66.64 >60.00 mL/min  Lipid panel  Result Value Ref Range   Cholesterol 119 0 -  200 mg/dL   Triglycerides 69.0 0.0 - 149.0 mg/dL   HDL 42.20 >39.00 mg/dL   VLDL 13.8 0.0 - 40.0 mg/dL   LDL Cholesterol 63 0 - 99 mg/dL   Total CHOL/HDL Ratio 3    NonHDL 76.80   Hemoglobin A1c  Result Value Ref Range   Hgb A1c MFr Bld 7.9 (H) 4.6 - 6.5 %  Microalbumin / creatinine urine ratio  Result Value Ref Range   Microalb, Ur <0.7 0.0 - 1.9 mg/dL   Creatinine,U 163.8 mg/dL   Microalb Creat Ratio 0.4 0.0 - 30.0 mg/g      Assessment & Plan:   Problem List Items Addressed This Visit    Thrombocytopenia    Stable.      Leukopenia    This has resolved as of recent labs.      HLD (hyperlipidemia)   Relevant Medications   lisinopril (PRINIVIL,ZESTRIL) tablet   simvastatin (ZOCOR) tablet   Healthcare maintenance - Primary    Preventative protocols reviewed and updated unless pt declined. Discussed healthy diet and lifestyle.       Diabetes type 2, uncontrolled    Chronic, deteriorated. Continue current regimen. Add [place] 50mg  daily. Recheck in 3 months for follow up. Start checking sugars at different times throughout the day and bring log to next visit.      Relevant Medications   glipiZIDE (GLUCOTROL) tablet   lisinopril (PRINIVIL,ZESTRIL) tablet   metFORMIN (GLUCOPHAGE) tablet   pioglitazone (ACTOS) tablet   simvastatin (ZOCOR) tablet   sitaGLIPtin (JANUVIA) tablet   Other Relevant Orders    Hemoglobin A1c       Follow up plan: Return in about 3 months (around 07/30/2014), or as needed, for follow up visit.

## 2014-04-30 NOTE — Assessment & Plan Note (Signed)
Stable

## 2014-05-01 ENCOUNTER — Telehealth: Payer: Self-pay

## 2014-05-01 NOTE — Telephone Encounter (Signed)
Pt was seen 04/30/14 for annual exam and pt went to pick up Januvia # 30 and cost to pt was $390.00; pt cannot afford that. Pt request different med sent to [facility].

## 2014-05-03 MED ORDER — CANAGLIFLOZIN 100 MG PO TABS: 100.0000 mg | ORAL_TABLET | Freq: Every day | ORAL | Status: DC

## 2014-05-03 MED ORDER — SAXAGLIPTIN HCL 5 MG PO TABS: 5.0000 mg | ORAL_TABLET | Freq: Every day | ORAL | Status: DC

## 2014-05-03 NOTE — Telephone Encounter (Signed)
Pt left v/m requesting cb about Januvia being too expensive.

## 2014-05-03 NOTE — Telephone Encounter (Signed)
Patient notified. He will price out meds and call me back about coupon and/or injectable.

## 2014-05-03 NOTE — Telephone Encounter (Signed)
Have him price out onglyza or invokana sent to pharmacy. All these 3rd tier diabetes meds are going to be more expensive. May need to consider injectable therapy. We do have a coupon he may be able to use - I don't see an expiration date. Placed in Kim's box.

## 2014-05-25 ENCOUNTER — Telehealth: Payer: Self-pay

## 2014-05-25 NOTE — Telephone Encounter (Signed)
Recommend start invokana as well and will recheck next labs. Ensure not on onglyza - and remove from list. Thanks.

## 2014-05-25 NOTE — Telephone Encounter (Signed)
Pt said just picked up invokana and was [facility] and A1C was 7.4. Pt has been trying to watch his diet more carefully and pt wants to know since A1C is going down should pt start the invokana. Pt request cb.

## 2014-05-29 ENCOUNTER — Telehealth: Payer: Self-pay

## 2014-05-29 NOTE — Telephone Encounter (Signed)
Spoke with patient and clarified that he is to be taking only the invokana. He verbalized understanding.

## 2014-05-29 NOTE — Telephone Encounter (Signed)
Message left advising patient.  

## 2014-05-29 NOTE — Telephone Encounter (Signed)
Pt left v/m requesting cb from [place] about clarifying which med pt is supposed to be taking.

## 2014-07-03 ENCOUNTER — Other Ambulatory Visit: Payer: Self-pay | Admitting: Family Medicine

## 2014-07-26 ENCOUNTER — Other Ambulatory Visit (INDEPENDENT_AMBULATORY_CARE_PROVIDER_SITE_OTHER): Payer: PRIVATE HEALTH INSURANCE

## 2014-07-26 DIAGNOSIS — E1165 Type 2 diabetes mellitus with hyperglycemia: Secondary | ICD-10-CM | POA: Diagnosis not present

## 2014-07-26 DIAGNOSIS — IMO0002 Reserved for concepts with insufficient information to code with codable children: Secondary | ICD-10-CM

## 2014-07-26 LAB — HEMOGLOBIN A1C: HEMOGLOBIN A1C: 7.1 % — AB (ref 4.6–6.5)

## 2014-07-30 ENCOUNTER — Ambulatory Visit (INDEPENDENT_AMBULATORY_CARE_PROVIDER_SITE_OTHER): Payer: PRIVATE HEALTH INSURANCE | Admitting: Family Medicine

## 2014-07-30 ENCOUNTER — Encounter: Payer: Self-pay | Admitting: Family Medicine

## 2014-07-30 VITALS — BP 110/72 | HR 76 | Temp 97.7°F | Wt 158.5 lb

## 2014-07-30 DIAGNOSIS — E1165 Type 2 diabetes mellitus with hyperglycemia: Secondary | ICD-10-CM | POA: Diagnosis not present

## 2014-07-30 DIAGNOSIS — IMO0002 Reserved for concepts with insufficient information to code with codable children: Secondary | ICD-10-CM

## 2014-07-30 NOTE — Assessment & Plan Note (Signed)
Did not start invokana or januvia 2/2 cost. Provided with tradjenta and empagliflozin coupons for pt to check with pharmacist to see if either would be more affordable. For now, now changes as healthy diet/lifestyle changes have led to improved A1c.  Would consider future switch from actos to one of above meds.  RTC 5 mo f/u DM. Pt agrees with plan.

## 2014-07-30 NOTE — Progress Notes (Signed)
BP 110/72 mmHg  Pulse 76  Temp(Src) 97.7 F (36.5 C) (Oral)  Wt 158 lb 8 oz (71.895 kg)   CC: DM f/u visit  Subjective:    Patient ID: Eduardo Pierce, male    DOB: 1950-06-05, 64 y.o.   MRN: [id number]  HPI: Eduardo Pierce is a 64 y.o. male presenting on 07/30/2014 for Follow-up   Planning on vacation at end of this month to [facility].   DM - has had better readings - checking fasting and postprandial lunch (80s fasting, just 2 >200 otherwise all postprandial <200). On max dose glipizide and metformin and also on actos 15mg  daily. On actos for several years. Last visit we started [place] 50mg  daily but it was too expensive ($400) so we changed to invokana 100mg  daily. This was also too expensive. Instead he has been working on decreased portion sizes and going to gym 3x/wk. More active. No paresthesias. No low sugars or hypoglycemic events. Completed DSME. Prevnar: not due. Pneuomvax: declined. Eye exam: 02/2014  Diabetic Foot Exam - Simple   No data filed       Relevant past medical, surgical, family and social history reviewed and updated as indicated. Interim medical history since our last visit reviewed. Allergies and medications reviewed and updated. Current Outpatient Prescriptions on File Prior to Visit  Medication Sig  . aspirin EC 81 MG tablet Take 81 mg by mouth daily.  . Cyanocobalamin (VITAMIN B-12 CR) 1000 MCG TBCR Take 1 tablet by mouth daily.   Marland Kitchen glipiZIDE (GLUCOTROL) 10 MG tablet Take one in the morning and one at bedtime  . glucose blood (FREESTYLE LITE) test strip Use to check sugar daily and as needed Dx: E11.9  . Lancets MISC Use to check blood sugar daily   . lisinopril (PRINIVIL,ZESTRIL) 2.5 MG tablet Take 1 tablet (2.5 mg total) by mouth daily.  . metFORMIN (GLUCOPHAGE) 1000 MG tablet Take 1 tablet (1,000 mg total) by mouth 2 (two) times daily.  . pioglitazone (ACTOS) 15 MG tablet Take 1 tablet (15 mg total) by mouth daily.  . simvastatin (ZOCOR) 40 MG  tablet Take 0.5 tablets (20 mg total) by mouth at bedtime.  . vitamin C (ASCORBIC ACID) 500 MG tablet Take 500 mg by mouth daily.     No current facility-administered medications on file prior to visit.    Review of Systems Per HPI unless specifically indicated above     Objective:    BP 110/72 mmHg  Pulse 76  Temp(Src) 97.7 F (36.5 C) (Oral)  Wt 158 lb 8 oz (71.895 kg)  Wt Readings from Last 3 Encounters:  07/30/14 158 lb 8 oz (71.895 kg)  04/30/14 161 lb 8 oz (73.256 kg)  10/24/13 165 lb 8 oz (75.07 kg)    Physical Exam  Constitutional: He appears well-developed and well-nourished. No distress.  HENT:  Head: Normocephalic and atraumatic.  Right Ear: External ear normal.  Left Ear: External ear normal.  Nose: Nose normal.  Mouth/Throat: Oropharynx is clear and moist. No oropharyngeal exudate.  Eyes: Conjunctivae and EOM are normal. Pupils are equal, round, and reactive to light. No scleral icterus.  Neck: Normal range of motion. Neck supple.  Cardiovascular: Normal rate, regular rhythm, normal heart sounds and intact distal pulses.   No murmur heard. Pulmonary/Chest: Effort normal and breath sounds normal. No respiratory distress. He has no wheezes. He has no rales.  Musculoskeletal: He exhibits no edema.  See HPI for foot exam if done  Lymphadenopathy:  He has no cervical adenopathy.  Skin: Skin is warm and dry. No rash noted.  Psychiatric: He has a normal mood and affect.  Nursing note and vitals reviewed.  Results for orders placed or performed in visit on 07/26/14  Hemoglobin A1c  Result Value Ref Range   Hgb A1c MFr Bld 7.1 (H) 4.6 - 6.5 %      Assessment & Plan:   Problem List Items Addressed This Visit    Diabetes type 2, uncontrolled - Primary    Did not start invokana or januvia 2/2 cost. Provided with tradjenta and empagliflozin coupons for pt to check with pharmacist to see if either would be more affordable. For now, now changes as healthy  diet/lifestyle changes have led to improved A1c.  Would consider future switch from actos to one of above meds.  RTC 5 mo f/u DM. Pt agrees with plan.          Follow up plan: Return in about 6 months (around 01/30/2015), or as needed, for follow up visit.

## 2014-07-30 NOTE — Patient Instructions (Addendum)
Congratulations on improvement in sugar. Keep it up! Return as needed or in 5 months for diabetes follow up.

## 2014-07-30 NOTE — Progress Notes (Signed)
Pre visit review using our clinic review tool, if applicable. No additional management support is needed unless otherwise documented below in the visit note. 

## 2014-10-31 ENCOUNTER — Telehealth: Payer: Self-pay | Admitting: Family Medicine

## 2014-10-31 DIAGNOSIS — E785 Hyperlipidemia, unspecified: Secondary | ICD-10-CM

## 2014-10-31 MED ORDER — SIMVASTATIN 40 MG PO TABS: 20.0000 mg | ORAL_TABLET | Freq: Every day | ORAL | Status: DC

## 2014-10-31 NOTE — Telephone Encounter (Signed)
Message left advising patient.  

## 2014-10-31 NOTE — Telephone Encounter (Signed)
Agree. Continue 40mg  1/2 tab daily. Sent update to pharmacy.

## 2014-10-31 NOTE — Telephone Encounter (Signed)
Pt left v/m; [facility]atin 40 mg taking 1 pill daily is ready for pick up. Pt noted few months ago Dr Darnell Level changed Simvastin 40 mg instructions to take 1/2 tab daily. This is noted on med list but do not see any notation of change in office notes 04/30/14 or 07/30/14. Pt request cb to verify he is supposed to be taking 1/2 daily of simvastatin 40 mg. Pt has over 100 tabs of Simvastatin 40 mg at home now. Please advise.

## 2014-10-31 NOTE — Addendum Note (Signed)
Addended by: Ria Bush on: 10/31/2014 11:07 AM   Modules accepted: Orders, Medications

## 2014-12-14 ENCOUNTER — Encounter: Payer: Self-pay | Admitting: Family

## 2014-12-14 ENCOUNTER — Ambulatory Visit (INDEPENDENT_AMBULATORY_CARE_PROVIDER_SITE_OTHER): Payer: Managed Care, Other (non HMO) | Admitting: Family

## 2014-12-14 ENCOUNTER — Other Ambulatory Visit (INDEPENDENT_AMBULATORY_CARE_PROVIDER_SITE_OTHER): Payer: Managed Care, Other (non HMO)

## 2014-12-14 VITALS — BP 124/70 | HR 75 | Temp 97.7°F | Resp 18 | Wt 163.0 lb

## 2014-12-14 DIAGNOSIS — R3915 Urgency of urination: Secondary | ICD-10-CM | POA: Diagnosis not present

## 2014-12-14 DIAGNOSIS — R339 Retention of urine, unspecified: Secondary | ICD-10-CM | POA: Diagnosis not present

## 2014-12-14 LAB — POCT URINALYSIS DIPSTICK
BILIRUBIN UA: NEGATIVE
Ketones, UA: NEGATIVE
Leukocytes, UA: NEGATIVE
Nitrite, UA: NEGATIVE
Protein, UA: NEGATIVE
Spec Grav, UA: 1.015
UROBILINOGEN UA: 0.2
pH, UA: 5.5

## 2014-12-14 LAB — PSA: PSA: 0.21 ng/mL (ref 0.10–4.00)

## 2014-12-14 MED ORDER — SULFAMETHOXAZOLE-TRIMETHOPRIM 800-160 MG PO TABS: 1.0000 | ORAL_TABLET | Freq: Two times a day (BID) | ORAL | Status: DC

## 2014-12-14 MED ORDER — TAMSULOSIN HCL 0.4 MG PO CAPS: 0.4000 mg | ORAL_CAPSULE | Freq: Every day | ORAL | Status: DC

## 2014-12-14 NOTE — Patient Instructions (Signed)
Thank you for choosing [facility].  Summary/Instructions:  Please start the Bactrim for 5 days. If not improvement in the next 2-3 days, please start the Flomax.   Your prescription(s) have been submitted to your pharmacy or been printed and provided for you. Please take as directed and contact our office if you believe you are having problem(s) with the medication(s) or have any questions.  Please stop by the lab on the basement level of the building for your blood work. Your results will be released to [place] (or called to you) after review, usually within 72 hours after test completion. If any changes need to be made, you will be notified at that same time.  If your symptoms worsen or fail to improve, please contact our office for further instruction, or in case of emergency go directly to the emergency room at the closest medical facility.   Benign Prostatic Hyperplasia An enlarged prostate (benign prostatic hyperplasia) is common in older men. You may experience the following:  Weak urine stream.  Dribbling.  Feeling like the bladder has not emptied completely.  Difficulty starting urination.  Getting up frequently at night to urinate.  Urinating more frequently during the day. HOME CARE INSTRUCTIONS  Monitor your prostatic hyperplasia for any changes. The following actions may help to alleviate any discomfort you are experiencing:  Give yourself time when you urinate.  Stay away from alcohol.  Avoid beverages containing caffeine, such as coffee, tea, and colas, because they can make the problem worse.  Avoid decongestants, antihistamines, and some prescription medicines that can make the problem worse.  Follow up with your health care provider for further treatment as recommended. SEEK MEDICAL CARE IF:  You are experiencing progressive difficulty voiding.  Your urine stream is progressively getting narrower.  You are awaking from sleep with the urge to void  more frequently.  You are constantly feeling the need to void.  You experience loss of urine, especially in small amounts. SEEK IMMEDIATE MEDICAL CARE IF:   You develop increased pain with urination or are unable to urinate.  You develop severe abdominal pain, vomiting, a high fever, or fainting.  You develop back pain or blood in your urine. MAKE SURE YOU:   Understand these instructions.  Will watch your condition.  Will get help right away if you are not doing well or get worse.   This information is not intended to replace advice given to you by your health care provider. Make sure you discuss any questions you have with your health care provider.   Document Released: 01/05/2005 Document Revised: 01/26/2014 Document Reviewed: 06/07/2012 Elsevier Interactive Patient Education [employer].

## 2014-12-14 NOTE — Progress Notes (Signed)
Subjective:    Patient ID: Eduardo Pierce, male    DOB: 1950/08/07, 64 y.o.   MRN: [id number]  Chief Complaint  Patient presents with  . Urinary Tract Infection    Started with upset stomach and diarrhea. feels as if he needs to urinate but can not.     HPI:  Eduardo Pierce is a 64 y.o. male who  has a past medical history of Diabetes mellitus type II (10/20/1995); Hyperlipidemia (02/20/1996); and Leukopenia (03/2011). and presents today for an acute office visit.  Started with an upset stomach and felt like he needed to vomit and couldn't. Notes that his symptoms went away yesterday and returned this morning with pain across his lower abdomen. Describes urinary frequency and has only been able to go just a little. Denies any fevers, chills, back pain, or dysuria. Blood sugar this morning was 124. This is the first time that he has had difficulty urinating.    Allergies  Allergen Reactions  . Penicillins Other (See Comments)    REACTION: tingling, nausea, dizzyness, rash     Current Outpatient Prescriptions on File Prior to Visit  Medication Sig Dispense Refill  . aspirin EC 81 MG tablet Take 81 mg by mouth daily.    . Cyanocobalamin (VITAMIN B-12 CR) 1000 MCG TBCR Take 1 tablet by mouth daily.     Marland Kitchen glipiZIDE (GLUCOTROL) 10 MG tablet Take one in the morning and one at bedtime 180 tablet 3  . glucose blood (FREESTYLE LITE) test strip Use to check sugar daily and as needed Dx: E11.9 100 each 3  . Lancets MISC Use to check blood sugar daily     . lisinopril (PRINIVIL,ZESTRIL) 2.5 MG tablet Take 1 tablet (2.5 mg total) by mouth daily. 90 tablet 3  . metFORMIN (GLUCOPHAGE) 1000 MG tablet Take 1 tablet (1,000 mg total) by mouth 2 (two) times daily. 180 tablet 3  . pioglitazone (ACTOS) 15 MG tablet Take 1 tablet (15 mg total) by mouth daily. 90 tablet 3  . simvastatin (ZOCOR) 40 MG tablet Take 0.5 tablets (20 mg total) by mouth at bedtime. 45 tablet 0  . vitamin C (ASCORBIC ACID) 500  MG tablet Take 500 mg by mouth daily.       No current facility-administered medications on file prior to visit.    Review of Systems  Constitutional: Negative for fever and chills.  Genitourinary: Positive for urgency, hematuria and difficulty urinating. Negative for frequency, flank pain, discharge, penile swelling and penile pain.      Objective:    BP 124/70 mmHg  Pulse 75  Temp(Src) 97.7 F (36.5 C) (Oral)  Resp 18  Wt 163 lb (73.936 kg)  SpO2 97% Nursing note and vital signs reviewed.  Physical Exam  Constitutional: He is oriented to person, place, and time. He appears well-developed and well-nourished. No distress.  Cardiovascular: Normal rate, regular rhythm, normal heart sounds and intact distal pulses.   Pulmonary/Chest: Effort normal and breath sounds normal.  Abdominal: Soft. Normal appearance and bowel sounds are normal. He exhibits no mass. There is no hepatosplenomegaly. There is no tenderness. There is no rigidity, no rebound, no guarding, no tenderness at McBurney's point and negative Murphy's sign.  Neurological: He is alert and oriented to person, place, and time.  Skin: Skin is warm and dry.  Psychiatric: He has a normal mood and affect. His behavior is normal. Judgment and thought content normal.       Assessment & Plan:  Problem List Items Addressed This Visit      Other   Urinary urgency    In office UA is negative for leukocytes and nitrites and positive for hematuria. Urine sent for culture. Obtain PSA. Prostatitis unlikely. Start Bacrtim to cover for UTI, however cannot rule out BPH. If no improvement is noted with Bactrim, instructed to start Flomax and follow up with PCP. Follow up if symptoms worsen or fail to improve.       Relevant Medications   sulfamethoxazole-trimethoprim (BACTRIM DS,SEPTRA DS) 800-160 MG tablet   tamsulosin (FLOMAX) 0.4 MG CAPS capsule   Other Relevant Orders   PSA (Completed)    Other Visit Diagnoses    Urinary  retention    -  Primary    Relevant Orders    POCT urinalysis dipstick (Completed)    Urine Culture

## 2014-12-14 NOTE — Progress Notes (Signed)
Pre visit review using our clinic review tool, if applicable. No additional management support is needed unless otherwise documented below in the visit note. 

## 2014-12-14 NOTE — Assessment & Plan Note (Signed)
In office UA is negative for leukocytes and nitrites and positive for hematuria. Urine sent for culture. Obtain PSA. Prostatitis unlikely. Start Bacrtim to cover for UTI, however cannot rule out BPH. If no improvement is noted with Bactrim, instructed to start Flomax and follow up with PCP. Follow up if symptoms worsen or fail to improve.

## 2014-12-15 LAB — URINE CULTURE
Colony Count: NO GROWTH
ORGANISM ID, BACTERIA: NO GROWTH

## 2014-12-16 ENCOUNTER — Encounter: Payer: Self-pay | Admitting: Family

## 2014-12-17 ENCOUNTER — Telehealth: Payer: Self-pay | Admitting: *Deleted

## 2014-12-17 NOTE — Telephone Encounter (Signed)
Seen prev, routed to PCP as FYI.

## 2014-12-17 NOTE — Telephone Encounter (Signed)
[place] Night - Client TELEPHONE ADVICE RECORD [facility] Patient Name: KHAMANI FABRY Gender: Male DOB: 23-Aug-1950 Age: 64 Y 11 M 12 D Return Phone Number: [phone] (Primary) Address: City/State/Zip: [place] Client [place] Night - Client Client Site [place] Physician Ria Bush Contact Type Call Call Type Triage / Clinical Relationship To Patient Self Return Phone Number [phone] (Primary) Chief Complaint Abdominal Pain Initial Comment caller states he has lower abd pain and freq urination PreDisposition Call Doctor Nurse Assessment Nurse: Mechele Dawley, RN, Amy Date/Time Eilene Ghazi Time): 12/14/2014 9:21:22 AM Confirm and document reason for call. If symptomatic, describe symptoms. ---[facility] SEVERE STOMACH ACHE. STOOLS WERE LOOSE. HE KEPT HAVING FREQ URINATION. HE KEPT FEELING LIKE NEEDED TO GOT AND DID NOT NEED TO. WOKE UP AROUND 0030 THIS MORNING WITH LOWER ABD PAINS THIS MORNING WITH FREQ URINATION. URINE TRICKLES OUT AND CAN'T VOID. 0130 HE TOOK IBUPROFEN AND RELIEVED THE PAIN. 0330 PAIN AGAIN AROUND 0430 WOKE UP NO MORE PAIN SINCE 0430 NOTHING ELSE SINCE THEN. HISTORY OF KIDNEY STONES. MIDDLE ABD REGION IS WHERE THE PAIN WAS. Has the patient traveled out of the country within the last 30 days? ---Not Applicable Does the patient have any new or worsening symptoms? ---Yes Will a triage be completed? ---Yes Related visit to physician within the last 2 weeks? ---No Does the PT have any chronic conditions? (i.e. diabetes, asthma, etc.) ---Yes List chronic conditions. ---DIABETIC (124 THIS MORNING ) Is this a behavioral health call? ---No Guidelines Guideline Title Affirmed Question Affirmed Notes Nurse Date/Time Eilene Ghazi Time) Urination Pain - Male Diabetes mellitus or weak immune system (e.g., HIV positive, cancer chemotherapy,  transplant patient) Mechele Dawley, RN, [place] 12/14/2014 9:29:06 AM PLEASE NOTE: All timestamps contained within this report are represented as [place] Standard Time. CONFIDENTIALTY NOTICE: This fax transmission is intended only for the addressee. It contains information that is legally privileged, confidential or otherwise protected from use or disclosure. If you are not the intended recipient, you are strictly prohibited from reviewing, disclosing, copying using or disseminating any of this information or taking any action in reliance on or regarding this information. If you have received this fax in error, please notify us immediately by telephone so that we can arrange for its return to [name]. Phone: [phone], Toll-Free: [phone], Fax: [phone] Page: 2 of 2 Call Id: [id number] [place]. Time Eilene Ghazi Time) Disposition Final User 12/14/2014 8:33:51 AM Send To Clinical Follow Up Jeral Fruit 12/14/2014 9:29:46 AM See Physician within 4 Hours (or PCP triage) Yes Mechele Dawley, RN, Amy [name] Understands: Yes Disagree/Comply: Comply Care Advice Given Per Guideline SEE PHYSICIAN WITHIN 4 HOURS (or PCP triage): CARE ADVICE given per Urination Pain - Male (Adult) guideline.

## 2014-12-17 NOTE — Telephone Encounter (Signed)
Unexplained hematuria and glucosuria. Will evaluate at f/u visit 01/03/2015.

## 2014-12-30 ENCOUNTER — Other Ambulatory Visit: Payer: Self-pay | Admitting: Family Medicine

## 2014-12-30 DIAGNOSIS — IMO0001 Reserved for inherently not codable concepts without codable children: Secondary | ICD-10-CM

## 2014-12-30 DIAGNOSIS — D696 Thrombocytopenia, unspecified: Secondary | ICD-10-CM

## 2014-12-30 DIAGNOSIS — E1165 Type 2 diabetes mellitus with hyperglycemia: Principal | ICD-10-CM

## 2014-12-31 ENCOUNTER — Other Ambulatory Visit (INDEPENDENT_AMBULATORY_CARE_PROVIDER_SITE_OTHER): Payer: Medicare HMO

## 2014-12-31 DIAGNOSIS — E1165 Type 2 diabetes mellitus with hyperglycemia: Secondary | ICD-10-CM

## 2014-12-31 DIAGNOSIS — D696 Thrombocytopenia, unspecified: Secondary | ICD-10-CM

## 2014-12-31 DIAGNOSIS — IMO0001 Reserved for inherently not codable concepts without codable children: Secondary | ICD-10-CM

## 2014-12-31 LAB — CBC WITH DIFFERENTIAL/PLATELET
BASOS PCT: 0.7 % (ref 0.0–3.0)
Basophils Absolute: 0 10*3/uL (ref 0.0–0.1)
EOS ABS: 0.2 10*3/uL (ref 0.0–0.7)
Eosinophils Relative: 4.5 % (ref 0.0–5.0)
HEMATOCRIT: 40.4 % (ref 39.0–52.0)
Hemoglobin: 13.7 g/dL (ref 13.0–17.0)
LYMPHS PCT: 22.8 % (ref 12.0–46.0)
Lymphs Abs: 0.8 10*3/uL (ref 0.7–4.0)
MCHC: 34 g/dL (ref 30.0–36.0)
MCV: 94.2 fl (ref 78.0–100.0)
Monocytes Absolute: 0.4 10*3/uL (ref 0.1–1.0)
Monocytes Relative: 11 % (ref 3.0–12.0)
NEUTROS ABS: 2.2 10*3/uL (ref 1.4–7.7)
Neutrophils Relative %: 61 % (ref 43.0–77.0)
Platelets: 168 10*3/uL (ref 150.0–400.0)
RBC: 4.29 Mil/uL (ref 4.22–5.81)
RDW: 13.4 % (ref 11.5–15.5)
WBC: 3.6 10*3/uL — AB (ref 4.0–10.5)

## 2014-12-31 LAB — HEMOGLOBIN A1C: HEMOGLOBIN A1C: 7.8 % — AB (ref 4.6–6.5)

## 2015-01-02 ENCOUNTER — Telehealth: Payer: Self-pay

## 2015-01-02 NOTE — Telephone Encounter (Signed)
Pt wants pioglitazone and test strips transferred from [facility] to [facility]. Pt will contact [facility] and request transfer.

## 2015-01-03 ENCOUNTER — Encounter: Payer: Self-pay | Admitting: Family Medicine

## 2015-01-03 ENCOUNTER — Ambulatory Visit (INDEPENDENT_AMBULATORY_CARE_PROVIDER_SITE_OTHER): Payer: Medicare HMO | Admitting: Family Medicine

## 2015-01-03 VITALS — BP 126/74 | HR 72 | Temp 97.7°F | Wt 162.5 lb

## 2015-01-03 DIAGNOSIS — IMO0001 Reserved for inherently not codable concepts without codable children: Secondary | ICD-10-CM

## 2015-01-03 DIAGNOSIS — R319 Hematuria, unspecified: Secondary | ICD-10-CM

## 2015-01-03 DIAGNOSIS — Z87448 Personal history of other diseases of urinary system: Secondary | ICD-10-CM | POA: Insufficient documentation

## 2015-01-03 DIAGNOSIS — D72819 Decreased white blood cell count, unspecified: Secondary | ICD-10-CM | POA: Diagnosis not present

## 2015-01-03 DIAGNOSIS — E1165 Type 2 diabetes mellitus with hyperglycemia: Secondary | ICD-10-CM

## 2015-01-03 LAB — POCT URINALYSIS DIPSTICK
Bilirubin, UA: NEGATIVE
Blood, UA: NEGATIVE
Ketones, UA: NEGATIVE
LEUKOCYTES UA: NEGATIVE
NITRITE UA: NEGATIVE
PH UA: 5.5
PROTEIN UA: NEGATIVE
Spec Grav, UA: 1.025
Urobilinogen, UA: 0.2

## 2015-01-03 NOTE — Assessment & Plan Note (Addendum)
Chronic, uncontrolled. Difficulty affording meds. Goal has been to come off actos. Pt will again price out DPP4 and SGLT2 inhibitors with new insurance (medicare) Continues to refuse pneumococcal and influenza vaccinations

## 2015-01-03 NOTE — Assessment & Plan Note (Signed)
Recurrent. Continue to monitor. Prior workup unrevealing.

## 2015-01-03 NOTE — Assessment & Plan Note (Signed)
Recent presumed prostatitis treated with 5d bactirm course. sxs have resolved. Will recheck UA to ensure hematuria has cleared. If persistent will recommend urol eval (given longterm actos use and ex smoker). Pt agrees with plan.

## 2015-01-03 NOTE — Addendum Note (Signed)
Addended by: Royann Shivers A on: 01/03/2015 12:46 PM   Modules accepted: Orders

## 2015-01-03 NOTE — Progress Notes (Signed)
BP 126/74 mmHg  Pulse 72  Temp(Src) 97.7 F (36.5 C) (Oral)  Wt 162 lb 8 oz (73.71 kg)   CC: 5 mo f/u visit  Subjective:    Patient ID: Eduardo Pierce, male    DOB: August 16, 1950, 64 y.o.   MRN: [id number]  HPI: Eduardo Pierce is a 64 y.o. male presenting on 01/03/2015 for Follow-up   [facility] with [facility], abdominal pain and trouble voiding. and found to have unexplained hematuria (UCx no growth). Treated for prostatitis with 5d bactrim course - symptoms have resolved. Flomax was not needed. On longterm actos, former smoker (quit 1988). PSA <1.   DM - checking fasting regularly (110-130). On max dose glipizide and metformin and also on actos 15mg  daily. On actos for several years. Trouble affording other medications (invokana, januvia, tradjenta, empagliflozin). Continues working on decreased portion sizes and going to gym 3x/wk. No paresthesias. No low sugars or hypoglycemic events. Completed DSME. Prevnar: declines. Pneuomvax: declined. Eye exam: 02/2014 Lab Results  Component Value Date   HGBA1C 7.8* 12/31/2014    Diabetic Foot Exam - Simple   Simple Foot Form  Diabetic Foot exam was performed with the following findings:  Yes 01/03/2015 10:32 AM  Visual Inspection  No deformities, no ulcerations, no other skin breakdown bilaterally:  Yes  Sensation Testing  Intact to touch and monofilament testing bilaterally:  Yes  Pulse Check  Posterior Tibialis and Dorsalis pulse intact bilaterally:  Yes  Comments  Small cyst 4th right dorsal toe      Relevant past medical, surgical, family and social history reviewed and updated as indicated. Interim medical history since our last visit reviewed. Allergies and medications reviewed and updated. Current Outpatient Prescriptions on File Prior to Visit  Medication Sig  . aspirin EC 81 MG tablet Take 81 mg by mouth daily.  . Cyanocobalamin (VITAMIN B-12 CR) 1000 MCG TBCR Take 1 tablet by mouth daily.   Marland Kitchen  glipiZIDE (GLUCOTROL) 10 MG tablet Take one in the morning and one at bedtime  . glucose blood (FREESTYLE LITE) test strip Use to check sugar daily and as needed Dx: E11.9  . Lancets MISC Use to check blood sugar daily   . lisinopril (PRINIVIL,ZESTRIL) 2.5 MG tablet Take 1 tablet (2.5 mg total) by mouth daily.  . metFORMIN (GLUCOPHAGE) 1000 MG tablet Take 1 tablet (1,000 mg total) by mouth 2 (two) times daily.  . pioglitazone (ACTOS) 15 MG tablet Take 1 tablet (15 mg total) by mouth daily.  . simvastatin (ZOCOR) 40 MG tablet Take 0.5 tablets (20 mg total) by mouth at bedtime.  . tamsulosin (FLOMAX) 0.4 MG CAPS capsule Take 1 capsule (0.4 mg total) by mouth daily.  . vitamin C (ASCORBIC ACID) 500 MG tablet Take 500 mg by mouth daily.     No current facility-administered medications on file prior to visit.    Review of Systems Per HPI unless specifically indicated in ROS section     Objective:    BP 126/74 mmHg  Pulse 72  Temp(Src) 97.7 F (36.5 C) (Oral)  Wt 162 lb 8 oz (73.71 kg)  Wt Readings from Last 3 Encounters:  01/03/15 162 lb 8 oz (73.71 kg)  12/14/14 163 lb (73.936 kg)  07/30/14 158 lb 8 oz (71.895 kg)    Physical Exam  Constitutional: He appears well-developed and well-nourished. No distress.  HENT:  Head: Normocephalic and atraumatic.  Right Ear: External ear normal.  Left Ear: External ear normal.  Nose: Nose normal.  Mouth/Throat: Oropharynx is clear and moist. No oropharyngeal exudate.  Eyes: Conjunctivae and EOM are normal. Pupils are equal, round, and reactive to light. No scleral icterus.  Neck: Normal range of motion. Neck supple.  Cardiovascular: Normal rate, regular rhythm, normal heart sounds and intact distal pulses.   No murmur heard. Pulmonary/Chest: Effort normal and breath sounds normal. No respiratory distress. He has no wheezes. He has no rales.  Musculoskeletal: He exhibits no edema.  See HPI for foot exam if done  Lymphadenopathy:    He has no  cervical adenopathy.  Skin: Skin is warm and dry. No rash noted.  Psychiatric: He has a normal mood and affect.  Nursing note and vitals reviewed.  Results for orders placed or performed in visit on 12/31/14  Hemoglobin A1c  Result Value Ref Range   Hgb A1c MFr Bld 7.8 (H) 4.6 - 6.5 %  CBC with Differential/Platelet  Result Value Ref Range   WBC 3.6 (L) 4.0 - 10.5 K/uL   RBC 4.29 4.22 - 5.81 Mil/uL   Hemoglobin 13.7 13.0 - 17.0 g/dL   HCT 40.4 39.0 - 52.0 %   MCV 94.2 78.0 - 100.0 fl   MCHC 34.0 30.0 - 36.0 g/dL   RDW 13.4 11.5 - 15.5 %   Platelets 168.0 150.0 - 400.0 K/uL   Neutrophils Relative % 61.0 43.0 - 77.0 %   Lymphocytes Relative 22.8 12.0 - 46.0 %   Monocytes Relative 11.0 3.0 - 12.0 %   Eosinophils Relative 4.5 0.0 - 5.0 %   Basophils Relative 0.7 0.0 - 3.0 %   Neutro Abs 2.2 1.4 - 7.7 K/uL   Lymphs Abs 0.8 0.7 - 4.0 K/uL   Monocytes Absolute 0.4 0.1 - 1.0 K/uL   Eosinophils Absolute 0.2 0.0 - 0.7 K/uL   Basophils Absolute 0.0 0.0 - 0.1 K/uL      Assessment & Plan:   Problem List Items Addressed This Visit    Leukopenia    Recurrent. Continue to monitor. Prior workup unrevealing.      Hematuria    Recent presumed prostatitis treated with 5d bactirm course. sxs have resolved. Will recheck UA to ensure hematuria has cleared. If persistent will recommend urol eval (given longterm actos use and ex smoker). Pt agrees with plan.      Diabetes type 2, uncontrolled (H[facility]) - Primary    Chronic, uncontrolled. Difficulty affording meds. Goal has been to come off actos. Pt will again price out DPP4 and SGLT2 inhibitors with new insurance (medicare) Continues to refuse pneumococcal and influenza vaccinations          Follow up plan: Return in about 4 months (around 05/04/2015), or as needed, for medicare wellness visit.

## 2015-01-03 NOTE — Patient Instructions (Addendum)
Repeat urinalysis today.  Price out [employer] with new insurance.  Think about pneumonia shots. Return as needed or in 4 months for welcome to medicare visit.

## 2015-01-03 NOTE — Progress Notes (Signed)
Pre visit review using our clinic review tool, if applicable. No additional management support is needed unless otherwise documented below in the visit note. 

## 2015-01-05 DIAGNOSIS — R69 Illness, unspecified: Secondary | ICD-10-CM | POA: Diagnosis not present

## 2015-02-12 DIAGNOSIS — L570 Actinic keratosis: Secondary | ICD-10-CM | POA: Diagnosis not present

## 2015-02-12 DIAGNOSIS — L821 Other seborrheic keratosis: Secondary | ICD-10-CM | POA: Diagnosis not present

## 2015-02-12 DIAGNOSIS — L812 Freckles: Secondary | ICD-10-CM | POA: Diagnosis not present

## 2015-02-12 DIAGNOSIS — D225 Melanocytic nevi of trunk: Secondary | ICD-10-CM | POA: Diagnosis not present

## 2015-03-08 DIAGNOSIS — E119 Type 2 diabetes mellitus without complications: Secondary | ICD-10-CM | POA: Diagnosis not present

## 2015-03-08 DIAGNOSIS — H25812 Combined forms of age-related cataract, left eye: Secondary | ICD-10-CM | POA: Diagnosis not present

## 2015-03-08 DIAGNOSIS — H25811 Combined forms of age-related cataract, right eye: Secondary | ICD-10-CM | POA: Diagnosis not present

## 2015-03-08 DIAGNOSIS — H2511 Age-related nuclear cataract, right eye: Secondary | ICD-10-CM | POA: Diagnosis not present

## 2015-03-08 DIAGNOSIS — H40013 Open angle with borderline findings, low risk, bilateral: Secondary | ICD-10-CM | POA: Diagnosis not present

## 2015-03-08 LAB — HM DIABETES EYE EXAM

## 2015-04-15 ENCOUNTER — Other Ambulatory Visit: Payer: Self-pay | Admitting: Family Medicine

## 2015-04-22 ENCOUNTER — Encounter: Payer: Medicare HMO | Admitting: Family Medicine

## 2015-04-24 ENCOUNTER — Other Ambulatory Visit: Payer: Self-pay | Admitting: Family Medicine

## 2015-04-24 DIAGNOSIS — D72819 Decreased white blood cell count, unspecified: Secondary | ICD-10-CM

## 2015-04-24 DIAGNOSIS — E785 Hyperlipidemia, unspecified: Secondary | ICD-10-CM

## 2015-04-24 DIAGNOSIS — IMO0001 Reserved for inherently not codable concepts without codable children: Secondary | ICD-10-CM

## 2015-04-24 DIAGNOSIS — Z114 Encounter for screening for human immunodeficiency virus [HIV]: Secondary | ICD-10-CM

## 2015-04-24 DIAGNOSIS — Z125 Encounter for screening for malignant neoplasm of prostate: Secondary | ICD-10-CM

## 2015-04-24 DIAGNOSIS — Z1159 Encounter for screening for other viral diseases: Secondary | ICD-10-CM

## 2015-04-24 DIAGNOSIS — E1165 Type 2 diabetes mellitus with hyperglycemia: Principal | ICD-10-CM

## 2015-04-25 ENCOUNTER — Other Ambulatory Visit: Payer: Self-pay | Admitting: Family Medicine

## 2015-04-25 ENCOUNTER — Other Ambulatory Visit (INDEPENDENT_AMBULATORY_CARE_PROVIDER_SITE_OTHER): Payer: Medicare HMO

## 2015-04-25 DIAGNOSIS — D72819 Decreased white blood cell count, unspecified: Secondary | ICD-10-CM

## 2015-04-25 DIAGNOSIS — E1165 Type 2 diabetes mellitus with hyperglycemia: Secondary | ICD-10-CM | POA: Diagnosis not present

## 2015-04-25 DIAGNOSIS — Z114 Encounter for screening for human immunodeficiency virus [HIV]: Secondary | ICD-10-CM

## 2015-04-25 DIAGNOSIS — IMO0001 Reserved for inherently not codable concepts without codable children: Secondary | ICD-10-CM

## 2015-04-25 DIAGNOSIS — E785 Hyperlipidemia, unspecified: Secondary | ICD-10-CM | POA: Diagnosis not present

## 2015-04-25 DIAGNOSIS — Z1159 Encounter for screening for other viral diseases: Secondary | ICD-10-CM

## 2015-04-25 DIAGNOSIS — Z125 Encounter for screening for malignant neoplasm of prostate: Secondary | ICD-10-CM | POA: Diagnosis not present

## 2015-04-25 DIAGNOSIS — R69 Illness, unspecified: Secondary | ICD-10-CM | POA: Diagnosis not present

## 2015-04-25 LAB — CBC WITH DIFFERENTIAL/PLATELET
Basophils Absolute: 0 10*3/uL (ref 0.0–0.1)
Basophils Relative: 0.9 % (ref 0.0–3.0)
EOS PCT: 2.8 % (ref 0.0–5.0)
Eosinophils Absolute: 0.1 10*3/uL (ref 0.0–0.7)
HEMATOCRIT: 40.6 % (ref 39.0–52.0)
HEMOGLOBIN: 14 g/dL (ref 13.0–17.0)
LYMPHS PCT: 20.9 % (ref 12.0–46.0)
Lymphs Abs: 0.9 10*3/uL (ref 0.7–4.0)
MCHC: 34.5 g/dL (ref 30.0–36.0)
MCV: 94 fl (ref 78.0–100.0)
MONOS PCT: 10.4 % (ref 3.0–12.0)
Monocytes Absolute: 0.4 10*3/uL (ref 0.1–1.0)
NEUTROS ABS: 2.7 10*3/uL (ref 1.4–7.7)
Neutrophils Relative %: 65 % (ref 43.0–77.0)
PLATELETS: 176 10*3/uL (ref 150.0–400.0)
RBC: 4.32 Mil/uL (ref 4.22–5.81)
RDW: 13.2 % (ref 11.5–15.5)
WBC: 4.1 10*3/uL (ref 4.0–10.5)

## 2015-04-25 LAB — LIPID PANEL
CHOLESTEROL: 132 mg/dL (ref 0–200)
HDL: 40.5 mg/dL (ref >39.00)
LDL Cholesterol: 66 mg/dL (ref 0–99)
NonHDL: 91.67
TRIGLYCERIDES: 129 mg/dL (ref 0.0–149.0)
Total CHOL/HDL Ratio: 3
VLDL: 25.8 mg/dL (ref 0.0–40.0)

## 2015-04-25 LAB — HEMOGLOBIN A1C: Hgb A1c MFr Bld: 7.9 % — ABNORMAL HIGH (ref 4.6–6.5)

## 2015-04-25 LAB — BASIC METABOLIC PANEL
BUN: 18 mg/dL (ref 6–23)
CHLORIDE: 102 meq/L (ref 96–112)
CO2: 31 mEq/L (ref 19–32)
CREATININE: 1.17 mg/dL (ref 0.40–1.50)
Calcium: 9.4 mg/dL (ref 8.4–10.5)
GFR: 66.43 mL/min (ref >60.00)
GLUCOSE: 143 mg/dL — AB (ref 70–99)
POTASSIUM: 4.2 meq/L (ref 3.5–5.1)
Sodium: 139 mEq/L (ref 135–145)

## 2015-04-25 LAB — PSA, MEDICARE: PSA: 0.19 ng/ml (ref 0.10–4.00)

## 2015-04-25 LAB — HEPATITIS C ANTIBODY: HCV AB: NEGATIVE

## 2015-04-25 LAB — HIV ANTIBODY (ROUTINE TESTING W REFLEX): HIV 1&2 Ab, 4th Generation: NONREACTIVE

## 2015-04-29 ENCOUNTER — Telehealth: Payer: Self-pay | Admitting: Family Medicine

## 2015-04-29 DIAGNOSIS — H25811 Combined forms of age-related cataract, right eye: Secondary | ICD-10-CM | POA: Diagnosis not present

## 2015-04-29 DIAGNOSIS — H2511 Age-related nuclear cataract, right eye: Secondary | ICD-10-CM | POA: Diagnosis not present

## 2015-04-29 NOTE — Telephone Encounter (Signed)
A1c was 7.9% - we will discuss at [place].

## 2015-04-29 NOTE — Telephone Encounter (Signed)
Patient called and asked to get his Hgb A1c results.  Please call patient.

## 2015-04-30 ENCOUNTER — Encounter: Payer: Medicare HMO | Admitting: Family Medicine

## 2015-04-30 NOTE — Telephone Encounter (Signed)
Left detailed message on voicemail.  

## 2015-05-06 ENCOUNTER — Encounter: Payer: Self-pay | Admitting: Family Medicine

## 2015-05-09 ENCOUNTER — Encounter: Payer: Self-pay | Admitting: Family Medicine

## 2015-05-09 ENCOUNTER — Ambulatory Visit (INDEPENDENT_AMBULATORY_CARE_PROVIDER_SITE_OTHER): Payer: Medicare HMO | Admitting: Family Medicine

## 2015-05-09 VITALS — BP 122/70 | HR 68 | Temp 97.5°F | Ht 67.8 in | Wt 161.5 lb

## 2015-05-09 DIAGNOSIS — R7309 Other abnormal glucose: Secondary | ICD-10-CM | POA: Diagnosis not present

## 2015-05-09 DIAGNOSIS — Z7189 Other specified counseling: Secondary | ICD-10-CM | POA: Diagnosis not present

## 2015-05-09 DIAGNOSIS — E785 Hyperlipidemia, unspecified: Secondary | ICD-10-CM | POA: Diagnosis not present

## 2015-05-09 DIAGNOSIS — Z0000 Encounter for general adult medical examination without abnormal findings: Secondary | ICD-10-CM

## 2015-05-09 DIAGNOSIS — Z6823 Body mass index (BMI) 23.0-23.9, adult: Secondary | ICD-10-CM | POA: Diagnosis not present

## 2015-05-09 DIAGNOSIS — E1165 Type 2 diabetes mellitus with hyperglycemia: Secondary | ICD-10-CM | POA: Diagnosis not present

## 2015-05-09 DIAGNOSIS — D72819 Decreased white blood cell count, unspecified: Secondary | ICD-10-CM

## 2015-05-09 DIAGNOSIS — R9431 Abnormal electrocardiogram [ECG] [EKG]: Secondary | ICD-10-CM

## 2015-05-09 DIAGNOSIS — IMO0001 Reserved for inherently not codable concepts without codable children: Secondary | ICD-10-CM

## 2015-05-09 MED ORDER — CANAGLIFLOZIN 100 MG PO TABS: 100.0000 mg | ORAL_TABLET | Freq: Every day | ORAL | Status: DC

## 2015-05-09 NOTE — Assessment & Plan Note (Signed)
WBC normal on recent labs

## 2015-05-09 NOTE — Progress Notes (Signed)
BP 122/70 mmHg  Pulse 68  Temp(Src) 97.5 F (36.4 C) (Oral)  Ht 5' 7.8" (1.722 m)  Wt 161 lb 8 oz (73.256 kg)  BMI 24.70 kg/m2   CC: welcome to medicare visit  Subjective:    Patient ID: Eduardo Pierce, male    DOB: 06/21/1950, 65 y.o.   MRN: [id number]  HPI: Eduardo Pierce is a 65 y.o. male presenting on 05/09/2015 for Annual Exam   Uncontrolled diabetes - last visit I asked patient to price out DPP4 inhibitors and SGLT2 inhibitors. States much more affordable with new insurance Doctor, general practice). On max dose glipizide and metformin and also on actos 15mg  daily. Trouble affording other medications (invokana, januvia, tradjenta, empagliflozin). [facility] 7.7%. Fasting readings 100-140. Also checks 2hr postprandial 130-190.   Recent dental work, sinus repair. Recent cataract surgery.   Hearing screen - passed Vision screen - sees eye doctor - recent cataract surgery Fall risk screen - passed Depression screen - passed  Preventative: Colon screening - 06/08/2011. Prolapse type polyp, rpt due 10 yrs Ardis Hughs).  Prostate - normal PSA today (0.25). Yearly check.  Lung cancer screening - quit 1988 Influenza - declines  Pneumonia - declines  Td - 2006. zostavax 09/2012.  Advanced directive discussion - has at home. Will check at home and bring [name] copy.  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 Sugar free diet  Relevant past medical, surgical, family and social history reviewed and updated as indicated. Interim medical history since our last visit reviewed. Allergies and medications reviewed and updated. Current Outpatient Prescriptions on File Prior to Visit  Medication Sig  . aspirin EC 81 MG tablet Take 81 mg by mouth daily.    . Cyanocobalamin (VITAMIN B-12 CR) 1000 MCG TBCR Take 1 tablet by mouth daily.   Marland Kitchen glipiZIDE (GLUCOTROL) 10 MG tablet Take one in the morning and one at bedtime  . glucose blood (FREESTYLE LITE) test strip Use to check sugar daily and as needed Dx: E11.9  . Lancets MISC Use to check blood sugar daily   . lisinopril (PRINIVIL,ZESTRIL) 2.5 MG tablet Take 1 tablet (2.5 mg total) by mouth daily.  . metFORMIN (GLUCOPHAGE) 1000 MG tablet Take 1 tablet (1,000 mg total) by mouth 2 (two) times daily.  . pioglitazone (ACTOS) 15 MG tablet TAKE 1 TABLET BY MOUTH EVERY DAY  . simvastatin (ZOCOR) 40 MG tablet Take 0.5 tablets (20 mg total) by mouth at bedtime.  . tamsulosin (FLOMAX) 0.4 MG CAPS capsule Take 1 capsule (0.4 mg total) by mouth daily.  . vitamin C (ASCORBIC ACID) 500 MG tablet Take 500 mg by mouth daily.     No current facility-administered medications on file prior to visit.    Review of Systems Per HPI unless specifically indicated in ROS section     Objective:    BP 122/70 mmHg  Pulse 68  Temp(Src) 97.5 F (36.4 C) (Oral)  Ht 5' 7.8" (1.722 m)  Wt 161 lb 8 oz (73.256 kg)  BMI 24.70 kg/m2  Wt Readings from Last 3 Encounters:  05/09/15 161 lb 8 oz (73.256 kg)  01/03/15 162 lb 8 oz (73.71 kg)  12/14/14 163 lb (73.936 kg)    Physical Exam  Constitutional: He is  oriented to person, place, and time. He appears well-developed and well-nourished. No distress.  HENT:  Head: Normocephalic and atraumatic.  Right Ear: Hearing, tympanic membrane, external ear and ear canal normal.  Left Ear: Hearing, tympanic membrane, external ear and ear canal normal.  Nose: Nose normal.  Mouth/Throat: Uvula is midline, oropharynx is clear and moist and mucous membranes are normal. No oropharyngeal exudate, posterior oropharyngeal edema or posterior oropharyngeal erythema.  Eyes: Conjunctivae and EOM are normal. Pupils are equal, round, and reactive to light. No scleral icterus.  Neck: Normal range  of motion. Neck supple. Carotid bruit is not present. No thyromegaly present.  Cardiovascular: Normal rate, regular rhythm, normal heart sounds and intact distal pulses.   No murmur heard. Pulses:      Radial pulses are 2+ on the right side, and 2+ on the left side.  Pulmonary/Chest: Effort normal and breath sounds normal. No respiratory distress. He has no wheezes. He has no rales.  Abdominal: Soft. Bowel sounds are normal. He exhibits no distension and no mass. There is no tenderness. There is no rebound and no guarding.  Genitourinary: Rectum normal and prostate normal. Rectal exam shows no external hemorrhoid, no internal hemorrhoid, no fissure, no mass, no tenderness and anal tone normal. Prostate is not enlarged (20gm) and not tender.  Musculoskeletal: Normal range of motion. He exhibits no edema.  Lymphadenopathy:    He has no cervical adenopathy.  Neurological: He is alert and oriented to person, place, and time.  CN grossly intact, station and gait intact Recall 3/3 Calculation 5/5 serial 7s  Skin: Skin is warm and dry. No rash noted.  Psychiatric: He has a normal mood and affect. His behavior is normal. Judgment and thought content normal.  Nursing note and vitals reviewed.  Results for orders placed or performed in visit on 05/06/15  HM DIABETES EYE EXAM  Result Value Ref Range   HM Diabetic Eye Exam No Retinopathy No Retinopathy      Assessment & Plan:   Problem List Items Addressed This Visit    Diabetes type 2, uncontrolled ([place])    Remains uncontrolled. Now better able to afford diabetic regimen. Has invokana at home - rec restart this, refilled today.  Declines pneumococcal vaccination today.       Relevant Medications   canagliflozin (INVOKANA) 100 MG TABS tablet   HLD (hyperlipidemia)    Chronic, great control on current regimen. Continue simvastatin 20mg  daily (1/2 tablet)      Abnormal ECG    EKG has normalized.       Leukopenia    WBC normal on recent  labs      Advanced care planning/counseling discussion    Advanced directive discussion - has at home. Will check at home and bring [name] copy.       Welcome to Medicare preventive visit - Primary    I have personally reviewed the Medicare Annual Wellness questionnaire and have noted 1. The patient's medical and social history 2. Their use of alcohol, tobacco or illicit drugs 3. Their current medications and supplements 4. The patient's functional ability including ADL's, fall risks, home safety risks and hearing or visual impairment. Cognitive function has been assessed and addressed as indicated.  5. Diet and physical activity 6. Evidence for depression or mood disorders The patients weight, height, BMI have been recorded in the chart. I have made referrals, counseling and provided education to the patient based on review of the above and I have provided the pt  with a written personalized care plan for preventive services. Provider list updated.. See scanned questionairre as needed for further documentation. Reviewed preventative protocols and updated unless pt declined.   EKG - sinus arrhythmia with rate 75, mild LAD, normal intervals, no acute ST/T changes.      Relevant Orders   EKG 12-Lead (Completed)       Follow up plan: Return in about 4 months (around 09/08/2015), or as needed, for follow up visit.  Ria Bush, MD

## 2015-05-09 NOTE — Patient Instructions (Addendum)
Restart invokana 100mg  daily. Watch for yeast infection or bladder infection with this medicine.  Bring [name] a copy of your living will/advanced directives. Return in 4 months for diabetes follow up. Think about pneumococcal vaccine.   Health Maintenance, Male A healthy lifestyle and preventative care can promote health and wellness.  Maintain regular health, dental, and eye exams.  Eat a healthy diet. Foods like vegetables, fruits, whole grains, low-fat dairy products, and lean protein foods contain the nutrients you need and are low in calories. Decrease your intake of foods high in solid fats, added sugars, and salt. Get information about a proper diet from your health care provider, if necessary.  Regular physical exercise is one of the most important things you can do for your health. Most adults should get at least 150 minutes of moderate-intensity exercise (any activity that increases your heart rate and causes you to sweat) each week. In addition, most adults need muscle-strengthening exercises on 2 or more days a week.   Maintain a healthy weight. The body mass index (BMI) is a screening tool to identify possible weight problems. It provides an estimate of body fat based on height and weight. Your health care provider can find your BMI and can help you achieve or maintain a healthy weight. For males 20 years and older:  A BMI below 18.5 is considered underweight.  A BMI of 18.5 to 24.9 is normal.  A BMI of 25 to 29.9 is considered overweight.  A BMI of 30 and above is considered obese.  Maintain normal blood lipids and cholesterol by exercising and minimizing your intake of saturated fat. Eat a balanced diet with plenty of fruits and vegetables. Blood tests for lipids and cholesterol should begin at age 10 and be repeated every 5 years. If your lipid or cholesterol levels are high, you are over age 43, or you are at high risk for heart disease, you may need your cholesterol levels  checked more frequently.Ongoing high lipid and cholesterol levels should be treated with medicines if diet and exercise are not working.  If you smoke, find out from your health care provider how to quit. If you do not use tobacco, do not start.  Lung cancer screening is recommended for adults aged 60-80 years who are at high risk for developing lung cancer because of a history of smoking. A yearly low-dose CT scan of the lungs is recommended for people who have at least a 30-pack-year history of smoking and are current smokers or have quit within the past 15 years. A pack year of smoking is smoking an average of 1 pack of cigarettes a day for 1 year (for example, a 30-pack-year history of smoking could mean smoking 1 pack a day for 30 years or 2 packs a day for 15 years). Yearly screening should continue until the smoker has stopped smoking for at least 15 years. Yearly screening should be stopped for people who develop a health problem that would prevent them from having lung cancer treatment.  If you choose to drink alcohol, do not have more than 2 drinks per day. One drink is considered to be 12 oz (360 mL) of beer, 5 oz (150 mL) of wine, or 1.5 oz (45 mL) of liquor.  Avoid the use of street drugs. Do not share needles with anyone. Ask for help if you need support or instructions about stopping the use of drugs.  High blood pressure causes heart disease and increases the risk of stroke. High  blood pressure is more likely to develop in:  People who have blood pressure in the end of the normal range (100-139/85-89 mm Hg).  People who are overweight or obese.  People who are African American.  If you are 70-47 years of age, have your blood pressure checked every 3-5 years. If you are 99 years of age or older, have your blood pressure checked every year. You should have your blood pressure measured twice--once when you are at a hospital or clinic, and once when you are not at a hospital or clinic.  Record the average of the two measurements. To check your blood pressure when you are not at a hospital or clinic, you can use:  An automated blood pressure machine at a pharmacy.  A home blood pressure monitor.  If you are 25-47 years old, ask your health care provider if you should take aspirin to prevent heart disease.  Diabetes screening involves taking a blood sample to check your fasting blood sugar level. This should be done once every 3 years after age 60 if you are at a normal weight and without risk factors for diabetes. Testing should be considered at a younger age or be carried out more frequently if you are overweight and have at least 1 risk factor for diabetes.  Colorectal cancer can be detected and often prevented. Most routine colorectal cancer screening begins at the age of 90 and continues through age 43. However, your health care provider may recommend screening at an earlier age if you have risk factors for colon cancer. On a yearly basis, your health care provider may provide home test kits to check for hidden blood in the stool. A small camera at the end of a tube may be used to directly examine the colon (sigmoidoscopy or colonoscopy) to detect the earliest forms of colorectal cancer. Talk to your health care provider about this at age 13 when routine screening begins. A direct exam of the colon should be repeated every 5-10 years through age 75, unless early forms of precancerous polyps or small growths are found.  People who are at an increased risk for hepatitis B should be screened for this virus. You are considered at high risk for hepatitis B if:  You were born in a country where hepatitis B occurs often. Talk with your health care provider about which countries are considered high risk.  Your parents were born in a high-risk country and you have not received a shot to protect against hepatitis B (hepatitis B vaccine).  You have HIV or AIDS.  You use needles to  inject street drugs.  You live with, or have sex with, someone who has hepatitis B.  You are a man who has sex with other men (MSM).  You get hemodialysis treatment.  You take certain medicines for conditions like cancer, organ transplantation, and autoimmune conditions.  Hepatitis C blood testing is recommended for all people born from [birth year] through 1966 and any individual with known risk factors for hepatitis C.  Healthy men should no longer receive prostate-specific antigen (PSA) blood tests as part of routine cancer screening. Talk to your health care provider about prostate cancer screening.  Testicular cancer screening is not recommended for adolescents or adult males who have no symptoms. Screening includes self-exam, a health care provider exam, and other screening tests. Consult with your health care provider about any symptoms you have or any concerns you have about testicular cancer.  Practice safe sex. Use condoms and  avoid high-risk sexual practices to reduce the spread of sexually transmitted infections (STIs).  You should be screened for STIs, including gonorrhea and chlamydia if:  You are sexually active and are younger than 24 years.  You are older than 24 years, and your health care provider tells you that you are at risk for this type of infection.  Your sexual activity has changed since you were last screened, and you are at an increased risk for chlamydia or gonorrhea. Ask your health care provider if you are at risk.  If you are at risk of being infected with HIV, it is recommended that you take a prescription medicine daily to prevent HIV infection. This is called pre-exposure prophylaxis (PrEP). You are considered at risk if:  You are a man who has sex with other men (MSM).  You are a heterosexual man who is sexually active with multiple partners.  You take drugs by injection.  You are sexually active with a partner who has HIV.  Talk with your health care  provider about whether you are at high risk of being infected with HIV. If you choose to begin PrEP, you should first be tested for HIV. You should then be tested every 3 months for as long as you are taking PrEP.  Use sunscreen. Apply sunscreen liberally and repeatedly throughout the day. You should seek shade when your shadow is shorter than you. Protect yourself by wearing long sleeves, pants, a wide-brimmed hat, and sunglasses year round whenever you are outdoors.  Tell your health care provider of new moles or changes in moles, especially if there is a change in shape or color. Also, tell your health care provider if a mole is larger than the size of a pencil eraser.  A one-time screening for abdominal aortic aneurysm (AAA) and surgical repair of large AAAs by ultrasound is recommended for men aged 38-75 years who are current or former smokers.  Stay current with your vaccines (immunizations).   This information is not intended to replace advice given to you by your health care provider. Make sure you discuss any questions you have with your health care provider.   Document Released: 07/04/2007 Document Revised: 01/26/2014 Document Reviewed: 06/02/2010 Elsevier Interactive Patient Education [employer].

## 2015-05-09 NOTE — Assessment & Plan Note (Signed)
Remains uncontrolled. Now better able to afford diabetic regimen. Has invokana at home - rec restart this, refilled today.  Declines pneumococcal vaccination today.

## 2015-05-09 NOTE — Assessment & Plan Note (Signed)
Chronic, great control on current regimen. Continue simvastatin 20mg  daily (1/2 tablet)

## 2015-05-09 NOTE — Assessment & Plan Note (Signed)
EKG has normalized.

## 2015-05-09 NOTE — Assessment & Plan Note (Signed)
Advanced directive discussion - has at home. Will check at home and bring [name] copy.

## 2015-05-09 NOTE — Progress Notes (Signed)
Pre visit review using our clinic review tool, if applicable. No additional management support is needed unless otherwise documented below in the visit note. 

## 2015-05-09 NOTE — Assessment & Plan Note (Addendum)
I have personally reviewed the Medicare Annual Wellness questionnaire and have noted 1. The patient's medical and social history 2. Their use of alcohol, tobacco or illicit drugs 3. Their current medications and supplements 4. The patient's functional ability including ADL's, fall risks, home safety risks and hearing or visual impairment. Cognitive function has been assessed and addressed as indicated.  5. Diet and physical activity 6. Evidence for depression or mood disorders The patients weight, height, BMI have been recorded in the chart. I have made referrals, counseling and provided education to the patient based on review of the above and I have provided the pt with a written personalized care plan for preventive services. Provider list updated.. See scanned questionairre as needed for further documentation. Reviewed preventative protocols and updated unless pt declined.   EKG - sinus arrhythmia with rate 75, mild LAD, normal intervals, no acute ST/T changes.

## 2015-06-21 ENCOUNTER — Other Ambulatory Visit: Payer: Self-pay | Admitting: Family Medicine

## 2015-06-21 DIAGNOSIS — R69 Illness, unspecified: Secondary | ICD-10-CM | POA: Diagnosis not present

## 2015-08-07 ENCOUNTER — Other Ambulatory Visit: Payer: Self-pay

## 2015-08-07 DIAGNOSIS — E785 Hyperlipidemia, unspecified: Secondary | ICD-10-CM

## 2015-08-07 MED ORDER — SIMVASTATIN 40 MG PO TABS: 20.0000 mg | ORAL_TABLET | Freq: Every day | ORAL | Status: DC

## 2015-08-07 NOTE — Telephone Encounter (Signed)
Pt left v/m requesting refill simvastatin to [facility] [address]. Last annual 05/09/15. Pt advised done per protocol and pt voiced understanding.

## 2015-09-15 ENCOUNTER — Other Ambulatory Visit: Payer: Self-pay | Admitting: Family Medicine

## 2015-09-15 DIAGNOSIS — E1165 Type 2 diabetes mellitus with hyperglycemia: Principal | ICD-10-CM

## 2015-09-15 DIAGNOSIS — IMO0001 Reserved for inherently not codable concepts without codable children: Secondary | ICD-10-CM

## 2015-09-17 ENCOUNTER — Other Ambulatory Visit (INDEPENDENT_AMBULATORY_CARE_PROVIDER_SITE_OTHER): Payer: Medicare HMO

## 2015-09-17 DIAGNOSIS — E1165 Type 2 diabetes mellitus with hyperglycemia: Secondary | ICD-10-CM

## 2015-09-17 DIAGNOSIS — IMO0001 Reserved for inherently not codable concepts without codable children: Secondary | ICD-10-CM

## 2015-09-17 LAB — BASIC METABOLIC PANEL
BUN: 23 mg/dL (ref 6–23)
CHLORIDE: 102 meq/L (ref 96–112)
CO2: 28 meq/L (ref 19–32)
CREATININE: 1.11 mg/dL (ref 0.40–1.50)
Calcium: 9 mg/dL (ref 8.4–10.5)
GFR: 70.51 mL/min (ref >60.00)
GLUCOSE: 130 mg/dL — AB (ref 70–99)
Potassium: 4.1 mEq/L (ref 3.5–5.1)
SODIUM: 137 meq/L (ref 135–145)

## 2015-09-17 LAB — MICROALBUMIN / CREATININE URINE RATIO
Creatinine,U: 104 mg/dL
MICROALB/CREAT RATIO: 0.7 mg/g (ref 0.0–30.0)
Microalb, Ur: <0.7 mg/dL (ref 0.0–1.9)

## 2015-09-17 LAB — HEMOGLOBIN A1C: HEMOGLOBIN A1C: 6.5 % (ref 4.6–6.5)

## 2015-09-20 ENCOUNTER — Encounter: Payer: Self-pay | Admitting: Family Medicine

## 2015-09-20 ENCOUNTER — Ambulatory Visit (INDEPENDENT_AMBULATORY_CARE_PROVIDER_SITE_OTHER): Payer: Medicare HMO | Admitting: Family Medicine

## 2015-09-20 DIAGNOSIS — E119 Type 2 diabetes mellitus without complications: Secondary | ICD-10-CM | POA: Diagnosis not present

## 2015-09-20 NOTE — Assessment & Plan Note (Signed)
Chronic, marked improvement on invokana. Continue current regimen.  Pt declines flu and pneumonia shots today.

## 2015-09-20 NOTE — Progress Notes (Signed)
BP 118/70 (BP Location: Left Arm, Patient Position: Sitting, Cuff Size: Normal)   Pulse 83   Temp 97.8 F (36.6 C) (Oral)   Wt 161 lb (73 kg)   SpO2 97%   BMI 24.62 kg/m    CC: f/u visit Subjective:    Patient ID: Eduardo Pierce, male    DOB: Aug 20, 1950, 65 y.o.   MRN: [id number]  HPI: Eduardo Pierce is a 65 y.o. male presenting on 09/20/2015 for Diabetes (4 month follow-up)   DM - regularly does check sugars 70-110. Compliant with antihyperglycemic regimen which includes: invokana 100mg  daily, glipizide 10mg  BID, metformin 1000mg  BID and actos 15mg  daily. Denies low sugars or hypoglycemic symptoms. Several in 56s. Denies paresthesias. Denies UTI/yeast infections. Last diabetic eye exam 02/2015.  Pneumovax: DUE.  Prevnar: DUE.  Lab Results  Component Value Date   HGBA1C 6.5 09/17/2015   Diabetic Foot Exam - Simple   Simple Foot Form Diabetic Foot exam was performed with the following findings:  Yes 09/20/2015  8:35 AM  Visual Inspection No deformities, no ulcerations, no other skin breakdown bilaterally:  Yes See comments:  Yes Sensation Testing Intact to touch and monofilament testing bilaterally:  Yes Pulse Check Posterior Tibialis and Dorsalis pulse intact bilaterally:  Yes Comments Some skin maceration/breakdown between 4th/5th toes R foot Healing chigger bites      Relevant past medical, surgical, family and social history reviewed and updated as indicated. Interim medical history since our last visit reviewed. Allergies and medications reviewed and updated. Current Outpatient Prescriptions on File Prior to Visit  Medication Sig  . aspirin EC 81 MG tablet Take 81 mg by mouth daily.  . canagliflozin (INVOKANA) 100 MG TABS tablet Take 1 tablet (100 mg total) by mouth daily before breakfast.  . Cyanocobalamin (VITAMIN B-12 CR) 1000 MCG TBCR Take 1 tablet by mouth daily.   Marland Kitchen glipiZIDE (GLUCOTROL) 10 MG tablet TAKE 1 TABLET BY MOUTH IN THE MORNING AND 1 TABLET AT BEDTIME    . glucose blood (FREESTYLE LITE) test strip Use to check sugar daily and as needed Dx: E11.9  . Lancets MISC Use to check blood sugar daily   . lisinopril (PRINIVIL,ZESTRIL) 2.5 MG tablet TAKE 1 TABLET BY MOUTH DAILY  . metFORMIN (GLUCOPHAGE) 1000 MG tablet TAKE 1 TABLET BY MOUTH TWICE DAILY  . pioglitazone (ACTOS) 15 MG tablet TAKE 1 TABLET BY MOUTH EVERY DAY  . simvastatin (ZOCOR) 40 MG tablet Take 0.5 tablets (20 mg total) by mouth at bedtime.  . vitamin C (ASCORBIC ACID) 500 MG tablet Take 500 mg by mouth daily.    . tamsulosin (FLOMAX) 0.4 MG CAPS capsule Take 1 capsule (0.4 mg total) by mouth daily. (Patient not taking: Reported on 09/20/2015)   No current facility-administered medications on file prior to visit.     Review of Systems Per HPI unless specifically indicated in ROS section     Objective:    BP 118/70 (BP Location: Left Arm, Patient Position: Sitting, Cuff Size: Normal)   Pulse 83   Temp 97.8 F (36.6 C) (Oral)   Wt 161 lb (73 kg)   SpO2 97%   BMI 24.62 kg/m   Wt Readings from Last 3 Encounters:  09/20/15 161 lb (73 kg)  05/09/15 161 lb 8 oz (73.3 kg)  01/03/15 162 lb 8 oz (73.7 kg)    Physical Exam  Constitutional: He appears well-developed and well-nourished. No distress.  HENT:  Head: Normocephalic and atraumatic.  Right Ear: External ear  normal.  Left Ear: External ear normal.  Nose: Nose normal.  Mouth/Throat: Oropharynx is clear and moist. No oropharyngeal exudate.  Eyes: Conjunctivae and EOM are normal. Pupils are equal, round, and reactive to light. No scleral icterus.  Neck: Normal range of motion. Neck supple.  Cardiovascular: Normal rate, regular rhythm, normal heart sounds and intact distal pulses.   No murmur heard. Pulmonary/Chest: Effort normal and breath sounds normal. No respiratory distress. He has no wheezes. He has no rales.  Musculoskeletal: He exhibits no edema.  See HPI for foot exam if done  Lymphadenopathy:    He has no  cervical adenopathy.  Skin: Skin is warm and dry. No rash noted.  Psychiatric: He has a normal mood and affect.  Nursing note and vitals reviewed.  Results for orders placed or performed in visit on 09/17/15  Hemoglobin A1c  Result Value Ref Range   Hgb A1c MFr Bld 6.5 4.6 - 6.5 %  Microalbumin / creatinine urine ratio  Result Value Ref Range   Microalb, Ur <0.7 0.0 - 1.9 mg/dL   Creatinine,U 104.0 mg/dL   Microalb Creat Ratio 0.7 0.0 - 30.0 mg/g  Basic metabolic panel  Result Value Ref Range   Sodium 137 135 - 145 mEq/L   Potassium 4.1 3.5 - 5.1 mEq/L   Chloride 102 96 - 112 mEq/L   CO2 28 19 - 32 mEq/L   Glucose, Bld 130 (H) 70 - 99 mg/dL   BUN 23 6 - 23 mg/dL   Creatinine, Ser 1.11 0.40 - 1.50 mg/dL   Calcium 9.0 8.4 - 10.5 mg/dL   GFR 70.51 >60.00 mL/min      Assessment & Plan:   Problem List Items Addressed This Visit    Diabetes mellitus type 2, controlled, without complications (HCC)    Chronic, marked improvement on invokana. Continue current regimen.  Pt declines flu and pneumonia shots today.        Other Visit Diagnoses   None.      Follow up plan: Return in about 8 months (around 05/19/2016), or as needed, for medicare wellness visit.  Ria Bush, MD

## 2015-09-20 NOTE — Progress Notes (Signed)
Pre visit review using our clinic review tool, if applicable. No additional management support is needed unless otherwise documented below in the visit note. 

## 2015-09-20 NOTE — Patient Instructions (Addendum)
I recommend pneumonia and flu shots.  You are doing great today. Continue current medicines.  Return after 4/20 for next medicare wellness visit.

## 2015-10-29 ENCOUNTER — Telehealth: Payer: Self-pay | Admitting: Family Medicine

## 2015-10-29 NOTE — Telephone Encounter (Signed)
Pr has small lump in his testicles, and is requesting a call back. Offered an appt, but it is a week out.   cb number is  [phone]

## 2015-10-29 NOTE — Telephone Encounter (Signed)
Spoke with pt scheduled for tomorrow 8:15am

## 2015-10-30 ENCOUNTER — Ambulatory Visit (INDEPENDENT_AMBULATORY_CARE_PROVIDER_SITE_OTHER): Payer: Medicare HMO | Admitting: Family Medicine

## 2015-10-30 ENCOUNTER — Encounter: Payer: Self-pay | Admitting: Family Medicine

## 2015-10-30 VITALS — BP 118/60 | HR 93 | Temp 97.8°F | Resp 20 | Wt 158.5 lb

## 2015-10-30 DIAGNOSIS — N509 Disorder of male genital organs, unspecified: Secondary | ICD-10-CM | POA: Diagnosis not present

## 2015-10-30 DIAGNOSIS — N5089 Other specified disorders of the male genital organs: Secondary | ICD-10-CM | POA: Insufficient documentation

## 2015-10-30 NOTE — Progress Notes (Signed)
BP 118/60   Pulse 93   Temp 97.8 F (36.6 C) (Oral)   Resp 20   Wt 158 Eduardo 8 oz (71.9 kg)   SpO2 97%   BMI 24.24 kg/m    CC: check scrotal lesion Subjective:    Patient ID: Eduardo Pierce, male    DOB: 09/01/1950, 65 y.o.   MRN: [id number]  HPI: Eduardo Pierce is a 65 y.o. male presenting on 10/30/2015 for Groin Swelling   Noticed scrotal lesion a few days ago. Not tender, not itchy, denies recent injury. No fevers/chills, dysuria, urethral discharge, abd pain.   Relevant past medical, surgical, family and social history reviewed and updated as indicated. Interim medical history since our last visit reviewed. Allergies and medications reviewed and updated. Current Outpatient Prescriptions on File Prior to Visit  Medication Sig  . aspirin EC 81 MG tablet Take 81 mg by mouth daily.  . canagliflozin (INVOKANA) 100 MG TABS tablet Take 1 tablet (100 mg total) by mouth daily before breakfast.  . Cyanocobalamin (VITAMIN B-12 CR) 1000 MCG TBCR Take 1 tablet by mouth daily.   Marland Kitchen glipiZIDE (GLUCOTROL) 10 MG tablet TAKE 1 TABLET BY MOUTH IN THE MORNING AND 1 TABLET AT BEDTIME  . glucose blood (FREESTYLE LITE) test strip Use to check sugar daily and as needed Dx: E11.9  . Lancets MISC Use to check blood sugar daily   . lisinopril (PRINIVIL,ZESTRIL) 2.5 MG tablet TAKE 1 TABLET BY MOUTH DAILY  . metFORMIN (GLUCOPHAGE) 1000 MG tablet TAKE 1 TABLET BY MOUTH TWICE DAILY  . pioglitazone (ACTOS) 15 MG tablet TAKE 1 TABLET BY MOUTH EVERY DAY  . simvastatin (ZOCOR) 40 MG tablet Take 0.5 tablets (20 mg total) by mouth at bedtime.  . tamsulosin (FLOMAX) 0.4 MG CAPS capsule Take 1 capsule (0.4 mg total) by mouth daily.  . vitamin C (ASCORBIC ACID) 500 MG tablet Take 500 mg by mouth daily.     No current facility-administered medications on file prior to visit.     Review of Systems Per HPI unless specifically indicated in ROS section     Objective:    BP 118/60   Pulse 93   Temp 97.8 F  (36.6 C) (Oral)   Resp 20   Wt 158 Eduardo 8 oz (71.9 kg)   SpO2 97%   BMI 24.24 kg/m   Wt Readings from Last 3 Encounters:  10/30/15 158 Eduardo 8 oz (71.9 kg)  09/20/15 161 Eduardo (73 kg)  05/09/15 161 Eduardo 8 oz (73.3 kg)    Physical Exam  Constitutional: He appears well-developed and well-nourished. No distress.  Abdominal: Hernia confirmed negative in the right inguinal area and confirmed negative in the left inguinal area.  Genitourinary: Penis normal. Cremasteric reflex is present. Right testis shows mass. Right testis shows no swelling and no tenderness. Right testis is descended. Left testis shows no mass, no swelling and no tenderness. Left testis is descended. Circumcised.  Genitourinary Comments: Scrotal mass superior to R testicle, mobile and non tender  Lymphadenopathy:       Right: No inguinal adenopathy present.       Left: No inguinal adenopathy present.  Skin: Skin is warm and dry. No rash noted.  Psychiatric: He has a normal mood and affect.  Nursing note and vitals reviewed.     Assessment & Plan:   Problem List Items Addressed This Visit    Scrotal mass - Primary    New - anticipate hydocele. Confirm with scrotal US.  Discussed benign nature of condition, monitor for enlargement or pain for uro referral. Pt agrees with plan.      Relevant Orders   US Scrotum   US Art/Ven Flow Abd Pelv Doppler    Other Visit Diagnoses   None.      Follow up plan: Return if symptoms worsen or fail to improve.  Ria Bush, MD

## 2015-10-30 NOTE — Progress Notes (Signed)
Pre visit review using our clinic review tool, if applicable. No additional management support is needed unless otherwise documented below in the visit note. 

## 2015-10-30 NOTE — Assessment & Plan Note (Signed)
New - anticipate hydocele. Confirm with scrotal US. Discussed benign nature of condition, monitor for enlargement or pain for uro referral. Pt agrees with plan.

## 2015-10-30 NOTE — Patient Instructions (Signed)
Likely hydrocele. We will check ultrasound. See our referral coordinators on your way out today.   Hydrocele, Adult A hydrocele is a collection of fluid in the loose pouch of skin that holds the testicles (scrotum). Usually, it affects only one testicle. CAUSES This condition may be caused by:  An injury to the scrotum.  An infection.  A tumor or cancer of the testicle.  Twisting of a testicle.  Decreased blood flow to the scrotum. SYMPTOMS A hydrocele feels like a water-filled balloon. It may also feel heavy. A hydrocele can cause:  Swelling of the scrotum. The swelling may decrease when you lie down.  Swelling of the groin.  Mild discomfort in the scrotum.  Pain. This can develop if the hydrocele was caused by infection or twisting. DIAGNOSIS This condition may be diagnosed with a medical history, physical exam, and imaging tests. You may also have blood and urine tests to check for infection. TREATMENT Treatment may include:  Watching and waiting, particularly if the hydrocele causes no symptoms.  Treatment of the underlying condition. This may include using antibiotic medicine.  Surgery to drain the fluid. Some surgical options include:  Needle aspiration. For this procedure, a needle is used to drain fluid.  Hydrocelectomy. For this procedure, an incision is made in the scrotum to remove the fluid sac. HOME CARE INSTRUCTIONS  Keep all follow-up visits as told by your health care provider. This is important.  Watch the hydrocele for any changes.  Take over-the-counter and prescription medicines only as told by your health care provider.  If you were prescribed an antibiotic medicine, use it as told by your health care provider. Do not stop using the antibiotic even if your condition improves. SEEK MEDICAL CARE IF:  The swelling in your scrotum or groin gets worse.  The hydrocele becomes red, firm, tender to the touch, or painful.  You notice any changes in  the hydrocele.  You have a fever.   This information is not intended to replace advice given to you by your health care provider. Make sure you discuss any questions you have with your health care provider.   Document Released: 06/25/2009 Document Revised: 05/22/2014 Document Reviewed: 01/01/2014 Elsevier Interactive Patient Education [employer].

## 2015-11-06 ENCOUNTER — Ambulatory Visit
Admission: RE | Admit: 2015-11-06 | Discharge: 2015-11-06 | Disposition: A | Discharge status: Home / Self Care | Payer: Medicare HMO | Source: Ambulatory Visit | Attending: Family Medicine | Admitting: Family Medicine

## 2015-11-06 DIAGNOSIS — N509 Disorder of male genital organs, unspecified: Secondary | ICD-10-CM | POA: Diagnosis not present

## 2015-11-06 DIAGNOSIS — R938 Abnormal findings on diagnostic imaging of other specified body structures: Secondary | ICD-10-CM | POA: Insufficient documentation

## 2015-11-06 DIAGNOSIS — N503 Cyst of epididymis: Secondary | ICD-10-CM | POA: Diagnosis not present

## 2015-11-06 DIAGNOSIS — N5089 Other specified disorders of the male genital organs: Secondary | ICD-10-CM

## 2015-12-16 ENCOUNTER — Other Ambulatory Visit: Payer: Self-pay

## 2015-12-16 ENCOUNTER — Other Ambulatory Visit: Payer: Self-pay | Admitting: Family Medicine

## 2015-12-16 DIAGNOSIS — R69 Illness, unspecified: Secondary | ICD-10-CM | POA: Diagnosis not present

## 2015-12-16 MED ORDER — GLUCOSE BLOOD VI STRP: ORAL_STRIP | 3 refills | Status: DC

## 2015-12-16 NOTE — Telephone Encounter (Signed)
Pharmacy request 90-day supply; Rx to pharmacy/[facility] 11/27

## 2015-12-16 NOTE — Telephone Encounter (Signed)
Patient calls to request refills on his diabetic test strips.  He is being followed by Dr. Darnell Level for ongoing care and management.  Last seen for diabetes in September of 2017, not due for f/u again until the spring.  Will okay 1 year refills on testing supplies.

## 2016-01-01 ENCOUNTER — Other Ambulatory Visit: Payer: Self-pay | Admitting: Family Medicine

## 2016-01-01 ENCOUNTER — Other Ambulatory Visit: Payer: Self-pay

## 2016-01-01 MED ORDER — CANAGLIFLOZIN 100 MG PO TABS: 100.0000 mg | ORAL_TABLET | Freq: Every day | ORAL | 1 refills | Status: DC

## 2016-01-01 NOTE — Telephone Encounter (Signed)
Pt request 90 day rx invokana to [facility] [address]  Pt last seen 09/20/15 and has CPX scheduled next yr. Pt voiced understanding.

## 2016-01-29 ENCOUNTER — Other Ambulatory Visit: Payer: Self-pay | Admitting: *Deleted

## 2016-01-29 MED ORDER — PIOGLITAZONE HCL 15 MG PO TABS: 15.0000 mg | ORAL_TABLET | Freq: Every day | ORAL | 2 refills | Status: DC

## 2016-01-30 DIAGNOSIS — E119 Type 2 diabetes mellitus without complications: Secondary | ICD-10-CM | POA: Diagnosis not present

## 2016-01-30 DIAGNOSIS — H2512 Age-related nuclear cataract, left eye: Secondary | ICD-10-CM | POA: Diagnosis not present

## 2016-01-30 DIAGNOSIS — H26491 Other secondary cataract, right eye: Secondary | ICD-10-CM | POA: Diagnosis not present

## 2016-01-30 DIAGNOSIS — H40013 Open angle with borderline findings, low risk, bilateral: Secondary | ICD-10-CM | POA: Diagnosis not present

## 2016-01-30 DIAGNOSIS — Z961 Presence of intraocular lens: Secondary | ICD-10-CM | POA: Diagnosis not present

## 2016-01-30 LAB — HM DIABETES EYE EXAM

## 2016-03-04 ENCOUNTER — Encounter: Payer: Self-pay | Admitting: *Deleted

## 2016-03-09 DIAGNOSIS — L570 Actinic keratosis: Secondary | ICD-10-CM | POA: Diagnosis not present

## 2016-03-09 DIAGNOSIS — D225 Melanocytic nevi of trunk: Secondary | ICD-10-CM | POA: Diagnosis not present

## 2016-03-09 DIAGNOSIS — L814 Other melanin hyperpigmentation: Secondary | ICD-10-CM | POA: Diagnosis not present

## 2016-03-09 DIAGNOSIS — L821 Other seborrheic keratosis: Secondary | ICD-10-CM | POA: Diagnosis not present

## 2016-04-02 DIAGNOSIS — R69 Illness, unspecified: Secondary | ICD-10-CM | POA: Diagnosis not present

## 2016-05-06 DIAGNOSIS — R69 Illness, unspecified: Secondary | ICD-10-CM | POA: Diagnosis not present

## 2016-05-11 ENCOUNTER — Other Ambulatory Visit: Payer: Self-pay | Admitting: Family Medicine

## 2016-05-11 DIAGNOSIS — E119 Type 2 diabetes mellitus without complications: Secondary | ICD-10-CM

## 2016-05-11 DIAGNOSIS — E785 Hyperlipidemia, unspecified: Secondary | ICD-10-CM

## 2016-05-11 DIAGNOSIS — Z125 Encounter for screening for malignant neoplasm of prostate: Secondary | ICD-10-CM

## 2016-05-15 ENCOUNTER — Other Ambulatory Visit (INDEPENDENT_AMBULATORY_CARE_PROVIDER_SITE_OTHER): Payer: Medicare HMO

## 2016-05-15 DIAGNOSIS — E119 Type 2 diabetes mellitus without complications: Secondary | ICD-10-CM | POA: Diagnosis not present

## 2016-05-15 DIAGNOSIS — E785 Hyperlipidemia, unspecified: Secondary | ICD-10-CM | POA: Diagnosis not present

## 2016-05-15 DIAGNOSIS — Z125 Encounter for screening for malignant neoplasm of prostate: Secondary | ICD-10-CM

## 2016-05-15 LAB — LIPID PANEL
CHOL/HDL RATIO: 3
Cholesterol: 122 mg/dL (ref 0–200)
HDL: 43.9 mg/dL (ref >39.00)
LDL CALC: 60 mg/dL (ref 0–99)
NONHDL: 77.88
Triglycerides: 87 mg/dL (ref 0.0–149.0)
VLDL: 17.4 mg/dL (ref 0.0–40.0)

## 2016-05-15 LAB — COMPREHENSIVE METABOLIC PANEL
ALT: 14 U/L (ref 0–53)
AST: 16 U/L (ref 0–37)
Albumin: 4.3 g/dL (ref 3.5–5.2)
Alkaline Phosphatase: 52 U/L (ref 39–117)
BUN: 16 mg/dL (ref 6–23)
CHLORIDE: 105 meq/L (ref 96–112)
CO2: 28 meq/L (ref 19–32)
Calcium: 9.2 mg/dL (ref 8.4–10.5)
Creatinine, Ser: 1.12 mg/dL (ref 0.40–1.50)
GFR: 69.64 mL/min (ref >60.00)
GLUCOSE: 132 mg/dL — AB (ref 70–99)
POTASSIUM: 4.3 meq/L (ref 3.5–5.1)
Sodium: 141 mEq/L (ref 135–145)
Total Bilirubin: 0.8 mg/dL (ref 0.2–1.2)
Total Protein: 6.4 g/dL (ref 6.0–8.3)

## 2016-05-15 LAB — HEMOGLOBIN A1C: Hgb A1c MFr Bld: 7 % — ABNORMAL HIGH (ref 4.6–6.5)

## 2016-05-15 LAB — PSA, MEDICARE: PSA: 0.18 ng/ml (ref 0.10–4.00)

## 2016-05-20 ENCOUNTER — Ambulatory Visit (INDEPENDENT_AMBULATORY_CARE_PROVIDER_SITE_OTHER): Payer: Medicare HMO | Admitting: Family Medicine

## 2016-05-20 ENCOUNTER — Encounter: Payer: Self-pay | Admitting: Family Medicine

## 2016-05-20 VITALS — BP 120/72 | HR 90 | Temp 97.5°F | Ht 68.25 in | Wt 154.0 lb

## 2016-05-20 DIAGNOSIS — R197 Diarrhea, unspecified: Secondary | ICD-10-CM | POA: Insufficient documentation

## 2016-05-20 DIAGNOSIS — E785 Hyperlipidemia, unspecified: Secondary | ICD-10-CM

## 2016-05-20 DIAGNOSIS — R319 Hematuria, unspecified: Secondary | ICD-10-CM

## 2016-05-20 DIAGNOSIS — Z7189 Other specified counseling: Secondary | ICD-10-CM

## 2016-05-20 DIAGNOSIS — E119 Type 2 diabetes mellitus without complications: Secondary | ICD-10-CM

## 2016-05-20 DIAGNOSIS — Z0000 Encounter for general adult medical examination without abnormal findings: Secondary | ICD-10-CM | POA: Diagnosis not present

## 2016-05-20 LAB — POC URINALSYSI DIPSTICK (AUTOMATED)
BILIRUBIN UA: NEGATIVE
LEUKOCYTES UA: NEGATIVE
PH UA: 6 (ref 5.0–8.0)
Spec Grav, UA: 1.025 (ref 1.010–1.025)
Urobilinogen, UA: 0.2 E.U./dL

## 2016-05-20 NOTE — Progress Notes (Signed)
BP 120/72   Pulse 90   Temp 97.5 F (36.4 C)   Ht 5' 8.25" (1.734 m)   Wt 154 lb 0.1 oz (69.9 kg)   SpO2 99%   BMI 23.25 kg/m    CC: CPE Subjective:    Patient ID: Eduardo Pierce, male    DOB: [birth year], 66 y.o.   MRN: [id number]  HPI: Eduardo Pierce is a 66 y.o. male presenting on 05/20/2016 for Medicare Wellness and Diarrhea (since Sunday no appetite. )   Diarrhea of 4d duration, no appetite. Started with cough/congestion which has since resolved. Diarrhea described as dark, loose. Slowly improving. Tried kaopectate. Denies abd pain, fever, blood in stool. No new restaurants or foods or meds. Stays well hydrated.   Hearing screen - failed - pt denies trouble.  Vision screen - sees eye doctor regularly  Fall risk screen - passed Depression screen - passed  Preventative: Colon screening - 06/08/2011. Prolapse type polyp, rpt due 10 yrs Ardis Hughs).  Prostate - normal PSA today (0.25). Yearly check.  Lung cancer screening - quit 1988 Influenza - declines  Pneumonia - declines at this time but will consider Td - 2006. zostavax 09/2012. shingrix - discussed.  Advanced directive discussion - has at home. HCPOA would be wife. Will check at home and bring [name] copy.  Seat belt use discussed Sunscreen use discussed. No changing moles on skin. Ex smoker. Quit 1988. Wife smokes at home. Alcohol - none  "[name]"  Hobbies [employer] pumps Married lives wife Retired 2014 3 children, 1 out of home, 1 grandchild Activity: Exercises at Y 2-3 times per week - aerobic  Diet: good water, fruits/vegetables daily. Sugar free diet   Relevant past medical, surgical, family and social history reviewed and updated as indicated. Interim medical history since our last visit reviewed. Allergies and medications reviewed and updated. Outpatient Medications Prior to Visit  Medication Sig Dispense Refill  . aspirin EC 81 MG tablet Take 81 mg by mouth daily.    . canagliflozin (INVOKANA) 100 MG TABS  tablet Take 1 tablet (100 mg total) by mouth daily before breakfast. 90 tablet 1  . Cyanocobalamin (VITAMIN B-12 CR) 1000 MCG TBCR Take 1 tablet by mouth daily.     Marland Kitchen FREESTYLE LITE test strip TEST BLOOD SUGAR EVERY DAY AND AS NEEDED 300 each 3  . glipiZIDE (GLUCOTROL) 10 MG tablet TAKE 1 TABLET BY MOUTH IN THE MORNING AND 1 TABLET AT BEDTIME 180 tablet 3  . Lancets MISC Use to check blood sugar daily     . lisinopril (PRINIVIL,ZESTRIL) 2.5 MG tablet TAKE 1 TABLET BY MOUTH DAILY 90 tablet 3  . metFORMIN (GLUCOPHAGE) 1000 MG tablet TAKE 1 TABLET BY MOUTH TWICE DAILY 180 tablet 3  . pioglitazone (ACTOS) 15 MG tablet Take 1 tablet (15 mg total) by mouth daily. 90 tablet 2  . simvastatin (ZOCOR) 40 MG tablet Take 0.5 tablets (20 mg total) by mouth at bedtime. 45 tablet 3  . tamsulosin (FLOMAX) 0.4 MG CAPS capsule Take 1 capsule (0.4 mg total) by mouth daily. 30 capsule 3  . vitamin C (ASCORBIC ACID) 500 MG tablet Take 500 mg by mouth daily.       No facility-administered medications prior to visit.      Per HPI unless specifically indicated in ROS section below Review of Systems  Constitutional: Positive for appetite change (down). Negative for activity change, chills, fatigue, fever and unexpected weight change.  HENT: Negative for hearing loss.  Eyes: Negative for visual disturbance.  Respiratory: Negative for cough, chest tightness, shortness of breath and wheezing.   Cardiovascular: Negative for chest pain, palpitations and leg swelling.  Gastrointestinal: Positive for diarrhea and nausea (mild with diarrhea). Negative for abdominal distention, abdominal pain, blood in stool, constipation and vomiting.  Genitourinary: Negative for difficulty urinating and hematuria.  Musculoskeletal: Negative for arthralgias, myalgias and neck pain.  Skin: Negative for rash.  Neurological: Negative for dizziness, seizures, syncope and headaches.  Hematological: Negative for adenopathy. Bruises/bleeds  easily.  Psychiatric/Behavioral: Negative for dysphoric mood. The patient is not nervous/anxious.        Objective:    BP 120/72   Pulse 90   Temp 97.5 F (36.4 C)   Ht 5' 8.25" (1.734 m)   Wt 154 lb 0.1 oz (69.9 kg)   SpO2 99%   BMI 23.25 kg/m   Wt Readings from Last 3 Encounters:  05/20/16 154 lb 0.1 oz (69.9 kg)  10/30/15 158 lb 8 oz (71.9 kg)  09/20/15 161 lb (73 kg)    Physical Exam  Constitutional: He is oriented to person, place, and time. He appears well-developed and well-nourished. No distress.  HENT:  Head: Normocephalic and atraumatic.  Right Ear: Hearing, tympanic membrane, external ear and ear canal normal.  Left Ear: Hearing, tympanic membrane, external ear and ear canal normal.  Nose: Nose normal.  Mouth/Throat: Uvula is midline, oropharynx is clear and moist and mucous membranes are normal. No oropharyngeal exudate, posterior oropharyngeal edema or posterior oropharyngeal erythema.  Eyes: Conjunctivae and EOM are normal. Pupils are equal, round, and reactive to light. No scleral icterus.  Neck: Normal range of motion. Neck supple. Carotid bruit is not present. No thyromegaly present.  Cardiovascular: Normal rate, regular rhythm, normal heart sounds and intact distal pulses.   No murmur heard. Pulses:      Radial pulses are 2+ on the right side, and 2+ on the left side.  Pulmonary/Chest: Effort normal and breath sounds normal. No respiratory distress. He has no wheezes. He has no rales.  Abdominal: Soft. Bowel sounds are normal. He exhibits no distension and no mass. There is no tenderness. There is no rebound and no guarding.  Genitourinary: Rectum normal and prostate normal. Rectal exam shows no external hemorrhoid, no internal hemorrhoid, no fissure, no mass, no tenderness and anal tone normal. Prostate is not enlarged (20gm) and not tender.  Musculoskeletal: Normal range of motion. He exhibits no edema.  Lymphadenopathy:    He has no cervical adenopathy.    Neurological: He is alert and oriented to person, place, and time.  CN grossly intact, station and gait intact Recall 3/3  Calculation 5/5 serial 7s  Skin: Skin is warm and dry. No rash noted.  Psychiatric: He has a normal mood and affect. His behavior is normal. Judgment and thought content normal.  Nursing note and vitals reviewed.  Results for orders placed or performed in visit on 05/15/16  Lipid panel  Result Value Ref Range   Cholesterol 122 0 - 200 mg/dL   Triglycerides 87.0 0.0 - 149.0 mg/dL   HDL 43.90 >39.00 mg/dL   VLDL 17.4 0.0 - 40.0 mg/dL   LDL Cholesterol 60 0 - 99 mg/dL   Total CHOL/HDL Ratio 3    NonHDL 77.88   Comprehensive metabolic panel  Result Value Ref Range   Sodium 141 135 - 145 mEq/L   Potassium 4.3 3.5 - 5.1 mEq/L   Chloride 105 96 - 112 mEq/L   CO2  28 19 - 32 mEq/L   Glucose, Bld 132 (H) 70 - 99 mg/dL   BUN 16 6 - 23 mg/dL   Creatinine, Ser 1.12 0.40 - 1.50 mg/dL   Total Bilirubin 0.8 0.2 - 1.2 mg/dL   Alkaline Phosphatase 52 39 - 117 U/L   AST 16 0 - 37 U/L   ALT 14 0 - 53 U/L   Total Protein 6.4 6.0 - 8.3 g/dL   Albumin 4.3 3.5 - 5.2 g/dL   Calcium 9.2 8.4 - 10.5 mg/dL   GFR 69.64 >60.00 mL/min  Hemoglobin A1c  Result Value Ref Range   Hgb A1c MFr Bld 7.0 (H) 4.6 - 6.5 %  PSA, Medicare  Result Value Ref Range   PSA 0.18 0.10 - 4.00 ng/ml      Assessment & Plan:   Problem List Items Addressed This Visit    Advanced care planning/counseling discussion    Advanced directive discussion - has at home. HCPOA would be wife. Will check at home and bring [name] copy.       Diabetes mellitus type 2, controlled, without complications (HCC)    Chronic, stable. Continue current regimen including low dose ACEI.  Pt declines pneumococcal vaccination - will consider.  Discussed invokana and association with toe amputations - he will price out farxiga or jardiance and let us know if affordable to make switch.       Diarrhea    Anticipate viral  gastroenteritis. Discussed bland diet. Discussed anticipated course of recovery. Update if red flags develop (fever, abd pain, vomiting, blood in stool).       Health maintenance examination    Preventative protocols reviewed and updated unless pt declined. Discussed healthy diet and lifestyle.       Hematuria    Update UA in setting of actos use.       HLD (hyperlipidemia)    Chronic, stable. Continue current regimen (1/2 simvastatin tab daily).       Medicare annual wellness visit, initial - Primary    I have personally reviewed the Medicare Annual Wellness questionnaire and have noted 1. The patient's medical and social history 2. Their use of alcohol, tobacco or illicit drugs 3. Their current medications and supplements 4. The patient's functional ability including ADL's, fall risks, home safety risks and hearing or visual impairment. Cognitive function has been assessed and addressed as indicated.  5. Diet and physical activity 6. Evidence for depression or mood disorders The patients weight, height, BMI have been recorded in the chart. I have made referrals, counseling and provided education to the patient based on review of the above and I have provided the pt with a written personalized care plan for preventive services. Provider list updated.. See scanned questionairre as needed for further documentation. Reviewed preventative protocols and updated unless pt declined.           Follow up plan: Return in about 6 months (around 11/20/2016) for follow up visit.  Ria Bush, MD

## 2016-05-20 NOTE — Assessment & Plan Note (Signed)
Preventative protocols reviewed and updated unless pt declined. Discussed healthy diet and lifestyle.  

## 2016-05-20 NOTE — Addendum Note (Signed)
Addended by: Inocencio Homes on: 05/20/2016 09:35 AM   Modules accepted: Orders

## 2016-05-20 NOTE — Assessment & Plan Note (Signed)
Update UA in setting of actos use.

## 2016-05-20 NOTE — Progress Notes (Signed)
Pre visit review using our clinic review tool, if applicable. No additional management support is needed unless otherwise documented below in the visit note. 

## 2016-05-20 NOTE — Assessment & Plan Note (Signed)

## 2016-05-20 NOTE — Assessment & Plan Note (Signed)
Chronic, stable. Continue current regimen (1/2 simvastatin tab daily).

## 2016-05-20 NOTE — Patient Instructions (Addendum)
Bring [name] copy of your advanced directive to update chart.  Price out [employer] or farxiga in place of [employer] are doing well today. For diarrhea - work on bland diet - bananas, rice, applesauce, toast, chicken broth. Should continue to improve each day. If not, let us know.  Urinalysis today.  Return as needed or in 6 months for diabetes follow up visit.   Health Maintenance, Male A healthy lifestyle and preventive care is important for your health and wellness. Ask your health care provider about what schedule of regular examinations is right for you. What should I know about weight and diet?  Eat a Healthy Diet  Eat plenty of vegetables, fruits, whole grains, low-fat dairy products, and lean protein.  Do not eat a lot of foods high in solid fats, added sugars, or salt. Maintain a Healthy Weight  Regular exercise can help you achieve or maintain a healthy weight. You should:  Do at least 150 minutes of exercise each week. The exercise should increase your heart rate and make you sweat (moderate-intensity exercise).  Do strength-training exercises at least twice a week. Watch Your Levels of Cholesterol and Blood Lipids  Have your blood tested for lipids and cholesterol every 5 years starting at 68 years of age. If you are at high risk for heart disease, you should start having your blood tested when you are 67 years old. You may need to have your cholesterol levels checked more often if:  Your lipid or cholesterol levels are high.  You are older than 67 years of age.  You are at high risk for heart disease. What should I know about cancer screening? Many types of cancers can be detected early and may often be prevented. Lung Cancer  You should be screened every year for lung cancer if:  You are a current smoker who has smoked for at least 30 years.  You are a former smoker who has quit within the past 15 years.  Talk to your health care provider about your screening  options, when you should start screening, and how often you should be screened. Colorectal Cancer  Routine colorectal cancer screening usually begins at 67 years of age and should be repeated every 5-10 years until you are 67 years old. You may need to be screened more often if early forms of precancerous polyps or small growths are found. Your health care provider may recommend screening at an earlier age if you have risk factors for colon cancer.  Your health care provider may recommend using home test kits to check for hidden blood in the stool.  A small camera at the end of a tube can be used to examine your colon (sigmoidoscopy or colonoscopy). This checks for the earliest forms of colorectal cancer. Prostate and Testicular Cancer  Depending on your age and overall health, your health care provider may do certain tests to screen for prostate and testicular cancer.  Talk to your health care provider about any symptoms or concerns you have about testicular or prostate cancer. Skin Cancer  Check your skin from head to toe regularly.  Tell your health care provider about any new moles or changes in moles, especially if:  There is a change in a mole's size, shape, or color.  You have a mole that is larger than a pencil eraser.  Always use sunscreen. Apply sunscreen liberally and repeat throughout the day.  Protect yourself by wearing long sleeves, pants, a wide-brimmed hat, and sunglasses when outside.  What should I know about heart disease, diabetes, and high blood pressure?  If you are 41-65 years of age, have your blood pressure checked every 3-5 years. If you are 36 years of age or older, have your blood pressure checked every year. You should have your blood pressure measured twice-once when you are at a hospital or clinic, and once when you are not at a hospital or clinic. Record the average of the two measurements. To check your blood pressure when you are not at a hospital or  clinic, you can use:  An automated blood pressure machine at a pharmacy.  A home blood pressure monitor.  Talk to your health care provider about your target blood pressure.  If you are between 60-23 years old, ask your health care provider if you should take aspirin to prevent heart disease.  Have regular diabetes screenings by checking your fasting blood sugar level.  If you are at a normal weight and have a low risk for diabetes, have this test once every three years after the age of 47.  If you are overweight and have a high risk for diabetes, consider being tested at a younger age or more often.  A one-time screening for abdominal aortic aneurysm (AAA) by ultrasound is recommended for men aged 30-75 years who are current or former smokers. What should I know about preventing infection? Hepatitis B  If you have a higher risk for hepatitis B, you should be screened for this virus. Talk with your health care provider to find out if you are at risk for hepatitis B infection. Hepatitis C  Blood testing is recommended for:  Everyone born from [birth year] through 1966.  Anyone with known risk factors for hepatitis C. Sexually Transmitted Diseases (STDs)  You should be screened each year for STDs including gonorrhea and chlamydia if:  You are sexually active and are younger than 67 years of age.  You are older than 67 years of age and your health care provider tells you that you are at risk for this type of infection.  Your sexual activity has changed since you were last screened and you are at an increased risk for chlamydia or gonorrhea. Ask your health care provider if you are at risk.  Talk with your health care provider about whether you are at high risk of being infected with HIV. Your health care provider may recommend a prescription medicine to help prevent HIV infection. What else can I do?  Schedule regular health, dental, and eye exams.  Stay current with your vaccines  (immunizations).  Do not use any tobacco products, such as cigarettes, chewing tobacco, and e-cigarettes. If you need help quitting, ask your health care provider.  Limit alcohol intake to no more than 2 drinks per day. One drink equals 12 ounces of beer, 5 ounces of wine, or 1 ounces of hard liquor.  Do not use street drugs.  Do not share needles.  Ask your health care provider for help if you need support or information about quitting drugs.  Tell your health care provider if you often feel depressed.  Tell your health care provider if you have ever been abused or do not feel safe at home. This information is not intended to replace advice given to you by your health care provider. Make sure you discuss any questions you have with your health care provider. Document Released: 07/04/2007 Document Revised: 09/04/2015 Document Reviewed: 10/09/2014 Elsevier Interactive Patient Education  2017 [employer].

## 2016-05-20 NOTE — Assessment & Plan Note (Addendum)
Chronic, stable. Continue current regimen including low dose ACEI.  Pt declines pneumococcal vaccination - will consider.  Discussed invokana and association with toe amputations - he will price out farxiga or jardiance and let us know if affordable to make switch.

## 2016-05-20 NOTE — Assessment & Plan Note (Addendum)
Anticipate viral gastroenteritis. Discussed bland diet. Discussed anticipated course of recovery. Update if red flags develop (fever, abd pain, vomiting, blood in stool).

## 2016-05-20 NOTE — Assessment & Plan Note (Addendum)
Advanced directive discussion - has at home. HCPOA would be wife. Will check at home and bring [name] copy.

## 2016-05-21 ENCOUNTER — Other Ambulatory Visit: Payer: Self-pay | Admitting: Family Medicine

## 2016-05-21 NOTE — Telephone Encounter (Signed)
It appears you discussed con't Invokana at yesterday's CPX.  According to your note pt to compare cost of Invokana to Jardiance/Farxiga and let you know.  We rec'd refill request today for Invokana.  Ok to refill?

## 2016-05-22 ENCOUNTER — Telehealth: Payer: Self-pay

## 2016-05-22 MED ORDER — DAPAGLIFLOZIN PROPANEDIOL 10 MG PO TABS: 10.0000 mg | ORAL_TABLET | Freq: Every day | ORAL | 3 refills | Status: DC

## 2016-05-22 MED ORDER — METFORMIN HCL 850 MG PO TABS: 850.0000 mg | ORAL_TABLET | Freq: Two times a day (BID) | ORAL | 3 refills | Status: DC

## 2016-05-22 MED ORDER — LISINOPRIL 2.5 MG PO TABS: 2.5000 mg | ORAL_TABLET | Freq: Every day | ORAL | 3 refills | Status: DC

## 2016-05-22 NOTE — Telephone Encounter (Signed)
I spoke to patient. I advised rxs sent and new dose. He voiced understanding and thanks for call.

## 2016-05-22 NOTE — Telephone Encounter (Signed)
Let's try at metformin 850mg  BID. Refilled at lower dose farxiga 38mo supply sent in.  Lab Results  Component Value Date   HGBA1C 7.0 (H) 05/15/2016

## 2016-05-22 NOTE — Telephone Encounter (Signed)
Pt said an alternate med for invokana would be farxiga; a 3 month supply of farxiga cost to pt is same as invokana which is $121.00. Eduardo Pierce was much more expensive. Pt also request refill lisinopril to [facility]; done and pt has been cutting the metformin in half and taking bid due to diarrhea. FBS on 05/21/16 was good and FBS today was 102. Pt wants to know if should continue to cut metformin in half or go back to the metformin 1000 mg bid dose. Pt request cb. Pt will need refill on metformin to [facility].

## 2016-06-18 ENCOUNTER — Other Ambulatory Visit: Payer: Self-pay | Admitting: Family Medicine

## 2016-08-04 ENCOUNTER — Other Ambulatory Visit: Payer: Self-pay | Admitting: Family Medicine

## 2016-08-04 DIAGNOSIS — E785 Hyperlipidemia, unspecified: Secondary | ICD-10-CM

## 2016-09-04 ENCOUNTER — Telehealth: Payer: Self-pay | Admitting: Family Medicine

## 2016-09-04 NOTE — Telephone Encounter (Signed)
Patient Name: Eduardo Pierce DOB: 18-Mar-1950 Initial Comment [name] states his BS is normally in the 80 - 90's but his last 3 readings were in the 60's. Denies feeling any sx. Nurse Assessment Nurse: Cherie Dark RN, Jarrett Soho Date/Time (Eastern Time): 09/04/2016 1:48:07 PM Confirm and document reason for call. If symptomatic, describe symptoms. ---[name] states his blood sugars normally run in the 80-90's. His last 3 readings have been in the 60's. 37,09,64 this morning. Metformin, Glipizide, Farsega, and Pioglitazone for his diabetes, Does the patient have any new or worsening symptoms? ---Yes Will a triage be completed? ---Yes Related visit to physician within the last 2 weeks? ---No Does the PT have any chronic conditions? (i.e. diabetes, asthma, etc.) ---Yes List chronic conditions. ---diabetes, HTN, Is this a behavioral health or substance abuse call? ---No Guidelines Guideline Title Affirmed Question Affirmed Notes Diabetes - Low Blood Sugar [1] Morning (before breakfast) blood glucose < 80 mg/dl (4.5 mmol/L) AND [2] more than once in past week Final Disposition User Call PCP when Office is Open [place], RN, Jarrett Soho Comments Appt scheduled with PCP for Monday at 11:15am. Referrals REFERRED TO PCP OFFICE Disagree/Comply: Comply

## 2016-09-04 NOTE — Telephone Encounter (Signed)
Patient advised.

## 2016-09-04 NOTE — Telephone Encounter (Signed)
Pt has appt on 09/07/16 at 11:15 with Dr Darnell Level.

## 2016-09-04 NOTE — Telephone Encounter (Signed)
In the meantime, cut glipizide in half - 1/2 tab BID always with meals.

## 2016-09-07 ENCOUNTER — Ambulatory Visit (INDEPENDENT_AMBULATORY_CARE_PROVIDER_SITE_OTHER): Payer: Medicare HMO | Admitting: Family Medicine

## 2016-09-07 ENCOUNTER — Encounter: Payer: Self-pay | Admitting: Family Medicine

## 2016-09-07 VITALS — BP 124/64 | HR 83 | Temp 97.3°F | Wt 158.5 lb

## 2016-09-07 DIAGNOSIS — E119 Type 2 diabetes mellitus without complications: Secondary | ICD-10-CM

## 2016-09-07 LAB — HEMOGLOBIN A1C: Hgb A1c MFr Bld: 6.8 % — ABNORMAL HIGH (ref 4.6–6.5)

## 2016-09-07 NOTE — Patient Instructions (Addendum)
Price out synjardy - which is combination metformin and jardiance.  Continue lower glipizide dose - always take with a meal.  If recurrent low sugars or low sugar symptoms, stop glipizide altogether and let me know.

## 2016-09-07 NOTE — Progress Notes (Signed)
BP 124/64   Pulse 83   Temp (!) 97.3 F (36.3 C) (Oral)   Wt 158 lb 8 oz (71.9 kg)   SpO2 94%   BMI 23.92 kg/m    CC: hypoglycemia  Subjective:    Patient ID: Eduardo Pierce, male    DOB: [birth year], 66 y.o.   MRN: [id number]  HPI: Eduardo Pierce is a 66 y.o. male presenting on 09/07/2016 for Hypoglycemia (60s x3D)   Several low readings noted over the past week - to 53s - but without hypoglycemic symptoms.   Latest med change was from invokana to farxiga 10mg  daily (05/2016).  He continues metformin 850mg  bid and actos 15mg  daily. Last week we also asked him to decrease glipizide to 5mg  bid with meals.   After we decreased glipizide, this week fasting readings have been 78, 101, 99.   Denies changes in diet or exercise.   Lab Results  Component Value Date   HGBA1C 7.0 (H) 05/15/2016    Lab Results  Component Value Date   CREATININE 1.12 05/15/2016    Relevant past medical, surgical, family and social history reviewed and updated as indicated. Interim medical history since our last visit reviewed. Allergies and medications reviewed and updated. Outpatient Medications Prior to Visit  Medication Sig Dispense Refill  . aspirin EC 81 MG tablet Take 81 mg by mouth daily.    . Cyanocobalamin (VITAMIN B-12 CR) 1000 MCG TBCR Take 1 tablet by mouth daily.     . dapagliflozin propanediol (FARXIGA) 10 MG TABS tablet Take 10 mg by mouth daily. 90 tablet 3  . FREESTYLE LITE test strip TEST BLOOD SUGAR EVERY DAY AND AS NEEDED 300 each 3  . Lancets MISC Use to check blood sugar daily     . lisinopril (PRINIVIL,ZESTRIL) 2.5 MG tablet Take 1 tablet (2.5 mg total) by mouth daily. 90 tablet 3  . metFORMIN (GLUCOPHAGE) 850 MG tablet Take 1 tablet (850 mg total) by mouth 2 (two) times daily. 180 tablet 3  . pioglitazone (ACTOS) 15 MG tablet Take 1 tablet (15 mg total) by mouth daily. 90 tablet 2  . simvastatin (ZOCOR) 40 MG tablet TAKE 1/2 TABLET BY MOUTH AT BEDTIME 45 tablet 3  .  tamsulosin (FLOMAX) 0.4 MG CAPS capsule Take 1 capsule (0.4 mg total) by mouth daily. 30 capsule 3  . vitamin C (ASCORBIC ACID) 500 MG tablet Take 500 mg by mouth daily.      Marland Kitchen glipiZIDE (GLUCOTROL) 10 MG tablet TAKE 1 TABLET BY MOUTH IN THE MORNING AND 1 TABLET BY MOUTH AT BEDTIME (Patient taking differently: TAKE 1/2 TABLET BY MOUTH IN THE MORNING AND 1/2 TABLET BY MOUTH AT BEDTIME) 180 tablet 1   No facility-administered medications prior to visit.      Per HPI unless specifically indicated in ROS section below Review of Systems     Objective:    BP 124/64   Pulse 83   Temp (!) 97.3 F (36.3 C) (Oral)   Wt 158 lb 8 oz (71.9 kg)   SpO2 94%   BMI 23.92 kg/m   Wt Readings from Last 3 Encounters:  09/07/16 158 lb 8 oz (71.9 kg)  05/20/16 154 lb 0.1 oz (69.9 kg)  10/30/15 158 lb 8 oz (71.9 kg)    Physical Exam  Constitutional: He appears well-developed and well-nourished. No distress.  HENT:  Mouth/Throat: Oropharynx is clear and moist. No oropharyngeal exudate.  Cardiovascular: Normal rate, regular rhythm, normal heart sounds and intact  distal pulses.   No murmur heard. Pulmonary/Chest: Effort normal and breath sounds normal. No respiratory distress. He has no wheezes. He has no rales.  Musculoskeletal: He exhibits no edema.  Psychiatric: He has a normal mood and affect.  Nursing note and vitals reviewed.      Assessment & Plan:   Problem List Items Addressed This Visit    Diabetes mellitus type 2, controlled, without complications ([place]) - Primary    Chronic. Recent hypoglycemia likely related to farxiga taking effect. Doing better on lower glipizide dose of 5mg  bid with meals - will continue this and continue other antihyperglycemics. Pt agrees with plan.  Pt asks about synjardy - he will price this out.       Relevant Medications   glipiZIDE (GLUCOTROL) 10 MG tablet   Other Relevant Orders   Hemoglobin A1c       Follow up plan: No Follow-up on file.  Ria Bush, MD

## 2016-09-07 NOTE — Assessment & Plan Note (Signed)
Chronic. Recent hypoglycemia likely related to farxiga taking effect. Doing better on lower glipizide dose of 5mg  bid with meals - will continue this and continue other antihyperglycemics. Pt agrees with plan.  Pt asks about synjardy - he will price this out.

## 2016-09-09 ENCOUNTER — Telehealth: Payer: Self-pay | Admitting: Family Medicine

## 2016-09-09 NOTE — Telephone Encounter (Signed)
Released via mychart

## 2016-09-09 NOTE — Telephone Encounter (Signed)
Best number [phone]  Pt called to get his a1c results

## 2016-09-09 NOTE — Telephone Encounter (Signed)
Dr Darnell Level has not sent any notes for results he will be back in the office tomorrow

## 2016-09-24 ENCOUNTER — Telehealth: Payer: Self-pay

## 2016-09-24 NOTE — Telephone Encounter (Signed)
Would start with synjardy 5/1000mg  twice daily.

## 2016-09-24 NOTE — Telephone Encounter (Signed)
Pt left v/m; pt last seen 09/07/16; pt was to get prices on [place]; pt cked with [employer] ins and pt needs to know what dosage or mg would be prescribed before can get pricing. Pt request cb.

## 2016-09-25 NOTE — Telephone Encounter (Signed)
Spoke to pt and advised per Dr Darnell Level. States he will contact office back with pricing

## 2016-10-02 MED ORDER — EMPAGLIFLOZIN-METFORMIN HCL 5-1000 MG PO TABS: 1.0000 | ORAL_TABLET | Freq: Two times a day (BID) | ORAL | 0 refills | Status: DC

## 2016-10-02 NOTE — Telephone Encounter (Signed)
[facility]. Speaking to [facility]

## 2016-10-02 NOTE — Addendum Note (Signed)
Addended by: Pilar Grammes on: 10/02/2016 10:29 AM   Modules accepted: Orders

## 2016-10-02 NOTE — Telephone Encounter (Signed)
Spoke to pt

## 2016-10-02 NOTE — Telephone Encounter (Signed)
Pt left v/m requesting that synjardy 05/998 mg # 60 be sent to [facility] [place] and also [employer] needs PA for synjardy called to [phone]. Pt will then find out cost to pt for synjardy. Pt request cb when completed.

## 2016-10-02 NOTE — Telephone Encounter (Signed)
PA approved until 01/18/17.

## 2016-10-19 DIAGNOSIS — R69 Illness, unspecified: Secondary | ICD-10-CM | POA: Diagnosis not present

## 2016-10-23 ENCOUNTER — Other Ambulatory Visit: Payer: Self-pay | Admitting: Family Medicine

## 2016-11-22 ENCOUNTER — Other Ambulatory Visit: Payer: Self-pay | Admitting: Family Medicine

## 2016-12-07 ENCOUNTER — Ambulatory Visit: Payer: Medicare HMO | Admitting: Family Medicine

## 2016-12-07 ENCOUNTER — Encounter: Payer: Self-pay | Admitting: Family Medicine

## 2016-12-07 VITALS — BP 118/62 | HR 64 | Temp 97.8°F | Wt 161.0 lb

## 2016-12-07 DIAGNOSIS — E119 Type 2 diabetes mellitus without complications: Secondary | ICD-10-CM

## 2016-12-07 MED ORDER — GLIPIZIDE 5 MG PO TABS: 5.0000 mg | ORAL_TABLET | Freq: Two times a day (BID) | ORAL | 1 refills | Status: DC

## 2016-12-07 NOTE — Progress Notes (Signed)
BP 118/62 (BP Location: Left Arm, Patient Position: Sitting, Cuff Size: Normal)   Pulse 64   Temp 97.8 F (36.6 C) (Oral)   Wt 161 lb (73 kg)   SpO2 99%   BMI 24.30 kg/m    CC: 6 mo f/u visit Subjective:    Patient ID: Eduardo Pierce, male    DOB: Oct 08, 1950, 66 y.o.   MRN: [id number]  HPI: Eduardo Pierce is a 66 y.o. male presenting on 12/07/2016 for 6 mo follow-up (Requests rx for 5 mg glipizide )   DM - does regularly check sugars 90-120 fasting. 2 hours postprandial 130-160. Compliant with antihyperglycemic regimen which includes: glipizide 5mg  bid, synjardy 5/1000mg  bid, and actos 15mg  daily. Denies low sugars or hypoglycemic symptoms. Denies paresthesias. Last diabetic eye exam 01/2016. Pneumovax: due. Prevnar: due. Glucometer brand: [facility]. DSME: has completed.  Lab Results  Component Value Date   HGBA1C 6.8 (H) 09/07/2016   Diabetic Foot Exam - Simple   Simple Foot Form Diabetic Foot exam was performed with the following findings:  Yes 12/07/2016  8:21 AM  Visual Inspection No deformities, no ulcerations, no other skin breakdown bilaterally:  Yes Sensation Testing Intact to touch and monofilament testing bilaterally:  Yes Pulse Check Posterior Tibialis and Dorsalis pulse intact bilaterally:  Yes Comments Small calluses    Lab Results  Component Value Date   MICROALBUR <0.7 09/17/2015     Relevant past medical, surgical, family and social history reviewed and updated as indicated. Interim medical history since our last visit reviewed. Allergies and medications reviewed and updated. Outpatient Medications Prior to Visit  Medication Sig Dispense Refill  . aspirin EC 81 MG tablet Take 81 mg by mouth daily.    . Cyanocobalamin (VITAMIN B-12 CR) 1000 MCG TBCR Take 1 tablet by mouth daily.     Marland Kitchen FREESTYLE LITE test strip TEST BLOOD SUGAR EVERY DAY AND AS NEEDED 300 each 3  . glipiZIDE (GLUCOTROL) 10 MG tablet TAKE 1/2 TABLET BY MOUTH IN THE MORNING AND 1/2 TABLET  BY MOUTH AT BEDTIME    . Lancets MISC Use to check blood sugar daily     . lisinopril (PRINIVIL,ZESTRIL) 2.5 MG tablet Take 1 tablet (2.5 mg total) by mouth daily. 90 tablet 3  . pioglitazone (ACTOS) 15 MG tablet TAKE 1 TABLET (15 MG TOTAL) BY MOUTH DAILY. 90 tablet 0  . simvastatin (ZOCOR) 40 MG tablet TAKE 1/2 TABLET BY MOUTH AT BEDTIME 45 tablet 3  . SYNJARDY 05-998 MG TABS TAKE 1 TABLET BY MOUTH TWICE A DAY 60 tablet 3  . vitamin C (ASCORBIC ACID) 500 MG tablet Take 500 mg by mouth daily.      . dapagliflozin propanediol (FARXIGA) 10 MG TABS tablet Take 10 mg by mouth daily. 90 tablet 3  . metFORMIN (GLUCOPHAGE) 850 MG tablet Take 1 tablet (850 mg total) by mouth 2 (two) times daily. 180 tablet 3  . tamsulosin (FLOMAX) 0.4 MG CAPS capsule Take 1 capsule (0.4 mg total) by mouth daily. 30 capsule 3   No facility-administered medications prior to visit.      Per HPI unless specifically indicated in ROS section below Review of Systems     Objective:    BP 118/62 (BP Location: Left Arm, Patient Position: Sitting, Cuff Size: Normal)   Pulse 64   Temp 97.8 F (36.6 C) (Oral)   Wt 161 lb (73 kg)   SpO2 99%   BMI 24.30 kg/m   Wt Readings from Last  3 Encounters:  12/07/16 161 lb (73 kg)  09/07/16 158 lb 8 oz (71.9 kg)  05/20/16 154 lb 0.1 oz (69.9 kg)    Physical Exam  Constitutional: He appears well-developed and well-nourished. No distress.  HENT:  Head: Normocephalic and atraumatic.  Right Ear: External ear normal.  Left Ear: External ear normal.  Nose: Nose normal.  Mouth/Throat: Oropharynx is clear and moist. No oropharyngeal exudate.  Eyes: Conjunctivae and EOM are normal. Pupils are equal, round, and reactive to light. No scleral icterus.  Neck: Normal range of motion. Neck supple.  Cardiovascular: Normal rate, regular rhythm, normal heart sounds and intact distal pulses.  No murmur heard. Pulmonary/Chest: Effort normal and breath sounds normal. No respiratory distress.  He has no wheezes. He has no rales.  Musculoskeletal: He exhibits no edema.  See HPI for foot exam if done  Lymphadenopathy:    He has no cervical adenopathy.  Skin: Skin is warm and dry. No rash noted.  Psychiatric: He has a normal mood and affect.  Nursing note and vitals reviewed.  Results for orders placed or performed in visit on 09/07/16  Hemoglobin A1c  Result Value Ref Range   Hgb A1c MFr Bld 6.8 (H) 4.6 - 6.5 %      Assessment & Plan:   Problem List Items Addressed This Visit    Diabetes mellitus type 2, controlled, without complications ([place]) - Primary    Chronic, wonderful control on current regimen - continue.  Requests glipizide 5mg  at next refill.  RTC 6 mo CPE.       Relevant Orders   Basic metabolic panel   Hemoglobin A1c       Follow up plan: Return in about 6 months (around 06/06/2017) for annual exam, prior fasting for blood work, medicare wellness visit.  Ria Bush, MD

## 2016-12-07 NOTE — Assessment & Plan Note (Signed)
Chronic, wonderful control on current regimen - continue.  Requests glipizide 5mg  at next refill.  RTC 6 mo CPE.

## 2016-12-07 NOTE — Patient Instructions (Addendum)
Return Wednesday morning for labs.  You are doing well today.  Congratulations on wonderful sugar control!  Return as needed or in 6 months for medicare wellness visit and physical

## 2016-12-09 ENCOUNTER — Other Ambulatory Visit (INDEPENDENT_AMBULATORY_CARE_PROVIDER_SITE_OTHER): Payer: Medicare HMO

## 2016-12-09 DIAGNOSIS — E119 Type 2 diabetes mellitus without complications: Secondary | ICD-10-CM

## 2016-12-09 LAB — BASIC METABOLIC PANEL
BUN: 24 mg/dL — AB (ref 6–23)
CALCIUM: 9.3 mg/dL (ref 8.4–10.5)
CO2: 29 mEq/L (ref 19–32)
Chloride: 104 mEq/L (ref 96–112)
Creatinine, Ser: 1.18 mg/dL (ref 0.40–1.50)
GFR: 65.46 mL/min (ref >60.00)
GLUCOSE: 128 mg/dL — AB (ref 70–99)
POTASSIUM: 3.8 meq/L (ref 3.5–5.1)
Sodium: 140 mEq/L (ref 135–145)

## 2016-12-09 LAB — HEMOGLOBIN A1C: Hgb A1c MFr Bld: 7.1 % — ABNORMAL HIGH (ref 4.6–6.5)

## 2017-01-21 ENCOUNTER — Other Ambulatory Visit: Payer: Self-pay | Admitting: Family Medicine

## 2017-02-02 DIAGNOSIS — E119 Type 2 diabetes mellitus without complications: Secondary | ICD-10-CM | POA: Diagnosis not present

## 2017-02-02 DIAGNOSIS — H40013 Open angle with borderline findings, low risk, bilateral: Secondary | ICD-10-CM | POA: Diagnosis not present

## 2017-02-02 DIAGNOSIS — H25812 Combined forms of age-related cataract, left eye: Secondary | ICD-10-CM | POA: Diagnosis not present

## 2017-02-02 DIAGNOSIS — Z961 Presence of intraocular lens: Secondary | ICD-10-CM | POA: Diagnosis not present

## 2017-02-02 LAB — HM DIABETES EYE EXAM

## 2017-02-12 ENCOUNTER — Encounter: Payer: Self-pay | Admitting: Family Medicine

## 2017-03-23 ENCOUNTER — Telehealth: Payer: Self-pay | Admitting: Family Medicine

## 2017-03-23 NOTE — Telephone Encounter (Signed)
Copied from [place] [phone]. Topic: Quick Communication - Rx Refill/Question >> Mar 23, 2017 10:51 AM Neva Seat wrote: Glipizide 10mg   0 refills  Pt is asking for 5 mg - 2 times a day - 90 day supply. The 10 mg pills do not cut well inhalf is why he is asking for 5 mg tabs 2 times a day.  [id number] [facility], [place] - [address] [place] [address] Phone: [phone] Fax: [phone]

## 2017-03-24 ENCOUNTER — Other Ambulatory Visit: Payer: Self-pay

## 2017-03-24 MED ORDER — GLIPIZIDE 5 MG PO TABS: 5.0000 mg | ORAL_TABLET | Freq: Two times a day (BID) | ORAL | 1 refills | Status: DC

## 2017-03-29 ENCOUNTER — Other Ambulatory Visit: Payer: Self-pay | Admitting: Family Medicine

## 2017-03-30 DIAGNOSIS — L578 Other skin changes due to chronic exposure to nonionizing radiation: Secondary | ICD-10-CM | POA: Diagnosis not present

## 2017-03-30 DIAGNOSIS — L570 Actinic keratosis: Secondary | ICD-10-CM | POA: Diagnosis not present

## 2017-03-30 DIAGNOSIS — L814 Other melanin hyperpigmentation: Secondary | ICD-10-CM | POA: Diagnosis not present

## 2017-03-30 DIAGNOSIS — D225 Melanocytic nevi of trunk: Secondary | ICD-10-CM | POA: Diagnosis not present

## 2017-04-16 ENCOUNTER — Other Ambulatory Visit: Payer: Self-pay | Admitting: Family Medicine

## 2017-04-20 DIAGNOSIS — R69 Illness, unspecified: Secondary | ICD-10-CM | POA: Diagnosis not present

## 2017-05-30 ENCOUNTER — Other Ambulatory Visit: Payer: Self-pay | Admitting: Family Medicine

## 2017-06-16 ENCOUNTER — Other Ambulatory Visit: Payer: Self-pay | Admitting: Family Medicine

## 2017-06-16 DIAGNOSIS — Z125 Encounter for screening for malignant neoplasm of prostate: Secondary | ICD-10-CM

## 2017-06-16 DIAGNOSIS — E785 Hyperlipidemia, unspecified: Secondary | ICD-10-CM

## 2017-06-16 DIAGNOSIS — E119 Type 2 diabetes mellitus without complications: Secondary | ICD-10-CM

## 2017-06-17 ENCOUNTER — Ambulatory Visit (INDEPENDENT_AMBULATORY_CARE_PROVIDER_SITE_OTHER): Payer: Medicare HMO

## 2017-06-17 ENCOUNTER — Ambulatory Visit: Payer: Medicare HMO

## 2017-06-17 VITALS — BP 92/60 | HR 70 | Temp 98.2°F | Ht 67.5 in | Wt 154.8 lb

## 2017-06-17 DIAGNOSIS — Z0000 Encounter for general adult medical examination without abnormal findings: Secondary | ICD-10-CM

## 2017-06-17 DIAGNOSIS — E785 Hyperlipidemia, unspecified: Secondary | ICD-10-CM

## 2017-06-17 DIAGNOSIS — Z125 Encounter for screening for malignant neoplasm of prostate: Secondary | ICD-10-CM | POA: Diagnosis not present

## 2017-06-17 DIAGNOSIS — E119 Type 2 diabetes mellitus without complications: Secondary | ICD-10-CM | POA: Diagnosis not present

## 2017-06-17 LAB — LIPID PANEL
CHOL/HDL RATIO: 3
Cholesterol: 129 mg/dL (ref 0–200)
HDL: 44.6 mg/dL (ref >39.00)
LDL CALC: 60 mg/dL (ref 0–99)
NonHDL: 84.56
TRIGLYCERIDES: 121 mg/dL (ref 0.0–149.0)
VLDL: 24.2 mg/dL (ref 0.0–40.0)

## 2017-06-17 LAB — COMPREHENSIVE METABOLIC PANEL
ALT: 13 U/L (ref 0–53)
AST: 14 U/L (ref 0–37)
Albumin: 4.6 g/dL (ref 3.5–5.2)
Alkaline Phosphatase: 40 U/L (ref 39–117)
BILIRUBIN TOTAL: 0.7 mg/dL (ref 0.2–1.2)
BUN: 18 mg/dL (ref 6–23)
CALCIUM: 9.3 mg/dL (ref 8.4–10.5)
CHLORIDE: 104 meq/L (ref 96–112)
CO2: 26 meq/L (ref 19–32)
Creatinine, Ser: 1.09 mg/dL (ref 0.40–1.50)
GFR: 71.62 mL/min (ref >60.00)
Glucose, Bld: 84 mg/dL (ref 70–99)
Potassium: 4.4 mEq/L (ref 3.5–5.1)
Sodium: 138 mEq/L (ref 135–145)
Total Protein: 6.7 g/dL (ref 6.0–8.3)

## 2017-06-17 LAB — HEMOGLOBIN A1C: Hgb A1c MFr Bld: 6.8 % — ABNORMAL HIGH (ref 4.6–6.5)

## 2017-06-17 LAB — PSA, MEDICARE: PSA: 0.2 ng/mL (ref 0.10–4.00)

## 2017-06-17 NOTE — Progress Notes (Signed)
Subjective:   Eduardo Pierce is a 67 y.o. male who presents for Medicare Annual/Subsequent preventive examination.  Review of Systems:  N/A Cardiac Risk Factors include: advanced age (>33men, >67 women);male gender;diabetes mellitus     Objective:    Vitals: BP 92/60 (BP Location: Right Arm, Patient Position: Sitting, Cuff Size: Normal)   Pulse 70   Temp 98.2 F (36.8 C) (Oral)   Ht 5' 7.5" (1.715 m) Comment: no shoes  Wt 154 lb 12 oz (70.2 kg)   SpO2 96%   BMI 23.88 kg/m   Body mass index is 23.88 kg/m.  Advanced Directives 06/17/2017 03/13/2011  Does Patient Have a Medical Advance Directive? Yes Patient has advance directive, copy not in chart  Type of Advance Directive [place];Living will [place] in Chart? No - copy requested Copy requested from family  Pre-existing out of facility DNR order (yellow form or pink MOST form) - No    Tobacco Social History   Tobacco Use  Smoking Status Former Smoker  . Packs/day: 0.50  . Years: 18.00  . Pack years: 9.00  . Types: Cigarettes  . Last attempt to quit: 01/19/1986  . Years since quitting: 31.4  Smokeless Tobacco Never Used     Counseling given: No   Clinical Intake:  Pre-visit preparation completed: Yes  Pain : No/denies pain Pain Score: 0-No pain     Nutritional Status: BMI 25 -29 Overweight Nutritional Risks: None Diabetes: Yes CBG done?: No Did pt. bring in CBG monitor from home?: No  How often do you need to have someone help you when you read instructions, pamphlets, or other written materials from your doctor or pharmacy?: 1 - Never What is the last grade level you completed in school?: Bachelors degree  Interpreter Needed?: No  Comments: pt lives with spouse Information entered by :: LPinson, LPN  Past Medical History:  Diagnosis Date  . Diabetes mellitus type II 10/20/1995   completed DSME  . Hyperlipidemia  02/20/1996  . Leukopenia 03/2011   mild, normal periph smear, normal B12 and folate   Past Surgical History:  Procedure Laterality Date  . COLONOSCOPY  05/2011   prolapse type polyp, rpt due 10 yrs  . hospitalization  02/2011   vasovagal presyncope, some NSVT on tele  . nuclear stress echo  02/2011   no ischemia/infarct, normal LV wall motion, EF 68%   Family History  Problem Relation Age of Onset  . Hypertension Mother   . Diabetes Mother   . Stroke Mother   . Kidney disease Mother        failure-dialysis  . Heart disease Mother        MI  . Heart disease Other        CHF, CABG  . Cancer Other        lung  . Colon cancer Neg Hx    Social History   Socioeconomic History  . Marital status: Married    Spouse name: Not on file  . Number of children: 3  . Years of education: Not on file  . Highest education level: Not on file  Occupational History  . Occupation: [employer]    Employer: [employer]  Social Needs  . Financial resource strain: Not on file  . Food insecurity:    Worry: Not on file    Inability: Not on file  . Transportation needs:  Medical: Not on file    Non-medical: Not on file  Tobacco Use  . Smoking status: Former Smoker    Packs/day: 0.50    Years: 18.00    Pack years: 9.00    Types: Cigarettes    Last attempt to quit: 01/19/1986    Years since quitting: 31.4  . Smokeless tobacco: Never Used  Substance and Sexual Activity  . Alcohol use: No  . Drug use: No  . Sexual activity: Not Currently  Lifestyle  . Physical activity:    Days per week: Not on file    Minutes per session: Not on file  . Stress: Not on file  Relationships  . Social connections:    Talks on phone: Not on file    Gets together: Not on file    Attends religious service: Not on file    Active member of club or organization: Not on file    Attends meetings of clubs or organizations: Not on file    Relationship status: Not on file  Other Topics  Concern  . Not on file  Social History Narrative   "Eduardo Pierce"   Hobbies mg[employer] rep pumps   Married lives wife   3 children, 1 out of home, 1 grandchild   Activity: 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  Sugar free diet.    Outpatient Encounter Medications as of 06/17/2017  Medication Sig  . aspirin EC 81 MG tablet Take 81 mg by mouth daily.  . Cyanocobalamin (VITAMIN B-12 CR) 1000 MCG TBCR Take 1 tablet by mouth daily.   Marland Kitchen FREESTYLE LITE test strip TEST BLOOD SUGAR EVERY DAY AND AS NEEDED  . glipiZIDE (GLUCOTROL) 5 MG tablet Take 1 tablet (5 mg total) by mouth 2 (two) times daily before a meal.  . Lancets MISC Use to check blood sugar daily   . lisinopril (PRINIVIL,ZESTRIL) 2.5 MG tablet Take 1 tablet (2.5 mg total) by mouth daily. NEEDS PHYSICAL EXAM  . pioglitazone (ACTOS) 15 MG tablet TAKE 1 TABLET (15 MG TOTAL) BY MOUTH DAILY.  . simvastatin (ZOCOR) 40 MG tablet TAKE 1/2 TABLET BY MOUTH AT BEDTIME  . SYNJARDY 05-998 MG TABS TAKE 1 TABLET BY MOUTH TWICE A DAY  . vitamin C (ASCORBIC ACID) 500 MG tablet Take 500 mg by mouth daily.     No facility-administered encounter medications on file as of 06/17/2017.     Activities of Daily Living In your present state of health, do you have any difficulty performing the following activities: 06/17/2017  Hearing? N  Vision? N  Difficulty concentrating or making decisions? N  Walking or climbing stairs? N  Dressing or bathing? N  Doing errands, shopping? N  Preparing Food and eating ? N  Using the Toilet? N  In the past six months, have you accidently leaked urine? N  Do you have problems with loss of bowel control? N  Managing your Medications? N  Managing your Finances? N  Housekeeping or managing your Housekeeping? N  Some recent data might be hidden    Patient Care Team: Ria Bush, MD as PCP - General (Family Medicine)   Assessment:   This is a routine wellness examination  for Eduardo Pierce.   Hearing Screening   125Hz  250Hz  500Hz  1000Hz  2000Hz  3000Hz  4000Hz  6000Hz  8000Hz   Right ear:   40 40 40  40    Left ear:   40 40 40  40    Vision Screening  Comments: Vision exam in Jan 2019   Exercise Activities and Dietary recommendations Current Exercise Habits: Home exercise routine, Type of exercise: Other - see comments(elliptical machine), Time (Minutes): 60, Frequency (Times/Week): 3, Weekly Exercise (Minutes/Week): 180, Intensity: Moderate, Exercise limited by: None identified  Goals    . Increase physical activity     Starting 06/17/2017, I will continue to exercise for 60 minutes 3 days per week.        Fall Risk Fall Risk  06/17/2017 05/20/2016 05/09/2015  Falls in the past year? No No No   Depression Screen PHQ 2/9 Scores 06/17/2017 05/20/2016 05/09/2015  PHQ - 2 Score 0 0 0  PHQ- 9 Score 0 - -    Cognitive Function MMSE - Mini Mental State Exam 06/17/2017  Orientation to time 5  Orientation to Place 5  Registration 3  Attention/ Calculation 0  Recall 3  Language- name 2 objects 0  Language- repeat 1  Language- follow 3 step command 3  Language- read & follow direction 0  Write a sentence 0  Copy design 0  Total score 20       PLEASE NOTE: A Mini-Cog screen was completed. Maximum score is 20. A value of 0 denotes this part of [place] MMSE was not completed or the patient failed this part of the Mini-Cog screening.   Mini-Cog Screening Orientation to Time - Max 5 pts Orientation to Place - Max 5 pts Registration - Max 3 pts Recall - Max 3 pts Language Repeat - Max 1 pts Language Follow 3 Step Command - Max 3 pts   Immunization History  Administered Date(s) Administered  . Influenza Whole 12/02/2004  . Td 11/19/2004  . Zoster 10/18/2012    Screening Tests Health Maintenance  Topic Date Due  . DTaP/Tdap/Td (1 - Tdap) 06/18/2018 (Originally 11/20/2004)  . TETANUS/TDAP  06/18/2018 (Originally 11/20/2014)  . PNA vac Low Risk Adult (1 of 2 -  PCV13) 06/18/2018 (Originally 01/01/2015)  . INFLUENZA VACCINE  08/19/2017  . FOOT EXAM  12/07/2017  . HEMOGLOBIN A1C  12/18/2017  . OPHTHALMOLOGY EXAM  02/02/2018  . COLONOSCOPY  06/07/2021  . Hepatitis C Screening  Completed       Plan:     I have personally reviewed, addressed, and noted the following in the patient's chart:  A. Medical and social history B. Use of alcohol, tobacco or illicit drugs  C. Current medications and supplements D. Functional ability and status E.  Nutritional status F.  Physical activity G. Advance directives H. List of other physicians I.  Hospitalizations, surgeries, and ER visits in previous 12 months J.  [place] to include hearing, vision, cognitive, depression L. Referrals and appointments - none  In addition, I have reviewed and discussed with patient certain preventive protocols, quality metrics, and best practice recommendations. A written personalized care plan for preventive services as well as general preventive health recommendations were provided to patient.  See attached scanned questionnaire for additional information.   Signed,   Lindell Noe, MHA, BS, LPN Health Coach

## 2017-06-17 NOTE — Patient Instructions (Addendum)
Eduardo Pierce , Thank you for taking time to come for your Medicare Wellness Visit. I appreciate your ongoing commitment to your health goals. Please review the following plan we discussed and let me know if I can assist you in the future.   These are the goals we discussed: Goals    . Increase physical activity     Starting 06/17/2017, I will continue to exercise for 60 minutes 3 days per week.        This is a list of the screening recommended for you and due dates:  Health Maintenance  Topic Date Due  . DTaP/Tdap/Td vaccine (1 - Tdap) 06/18/2018*  . Tetanus Vaccine  06/18/2018*  . Pneumonia vaccines (1 of 2 - PCV13) 06/18/2018*  . Flu Shot  08/19/2017  . Complete foot exam   12/07/2017  . Hemoglobin A1C  12/18/2017  . Eye exam for diabetics  02/02/2018  . Colon Cancer Screening  06/07/2021  .  Hepatitis C: One time screening is recommended by Center for Disease Control  (CDC) for  adults born from [birth year] through 1966.   Completed  *Topic was postponed. The date shown is not the original due date.   Preventive Care for Adults  A healthy lifestyle and preventive care can promote health and wellness. Preventive health guidelines for adults include the following key practices.  . A routine yearly physical is a good way to check with your health care provider about your health and preventive screening. It is a chance to share any concerns and updates on your health and to receive a thorough exam.  . Visit your dentist for a routine exam and preventive care every 6 months. Brush your teeth twice a day and floss once a day. Good oral hygiene prevents tooth decay and gum disease.  . The frequency of eye exams is based on your age, health, family medical history, use  of contact lenses, and other factors. Follow your health care provider's recommendations for frequency of eye exams.  . Eat a healthy diet. Foods like vegetables, fruits, whole grains, low-fat dairy products, and lean protein  foods contain the nutrients you need without too many calories. Decrease your intake of foods high in solid fats, added sugars, and salt. Eat the right amount of calories for you. Get information about a proper diet from your health care provider, if necessary.  . Regular physical exercise is one of the most important things you can do for your health. Most adults should get at least 150 minutes of moderate-intensity exercise (any activity that increases your heart rate and causes you to sweat) each week. In addition, most adults need muscle-strengthening exercises on 2 or more days a week.  Silver Sneakers may be a benefit available to you. To determine eligibility, you may visit the website: www.silversneakers.com or contact program at 551-227-6630 Mon-Fri between 8AM-8PM.   . Maintain a healthy weight. The body mass index (BMI) is a screening tool to identify possible weight problems. It provides an estimate of body fat based on height and weight. Your health care provider can find your BMI and can help you achieve or maintain a healthy weight.   For adults 20 years and older: ? A BMI below 18.5 is considered underweight. ? A BMI of 18.5 to 24.9 is normal. ? A BMI of 25 to 29.9 is considered overweight. ? A BMI of 30 and above is considered obese.   . Maintain normal blood lipids and cholesterol levels by exercising and  minimizing your intake of saturated fat. Eat a balanced diet with plenty of fruit and vegetables. Blood tests for lipids and cholesterol should begin at age 33 and be repeated every 5 years. If your lipid or cholesterol levels are high, you are over 50, or you are at high risk for heart disease, you may need your cholesterol levels checked more frequently. Ongoing high lipid and cholesterol levels should be treated with medicines if diet and exercise are not working.  . If you smoke, find out from your health care provider how to quit. If you do not use tobacco, please do not  start.  . If you choose to drink alcohol, please do not consume more than 2 drinks per day. One drink is considered to be 12 ounces (355 mL) of beer, 5 ounces (148 mL) of wine, or 1.5 ounces (44 mL) of liquor.  . If you are 57-49 years old, ask your health care provider if you should take aspirin to prevent strokes.  . Use sunscreen. Apply sunscreen liberally and repeatedly throughout the day. You should seek shade when your shadow is shorter than you. Protect yourself by wearing long sleeves, pants, a wide-brimmed hat, and sunglasses year round, whenever you are outdoors.  . Once a month, do a whole body skin exam, using a mirror to look at the skin on your back. Tell your health care provider of new moles, moles that have irregular borders, moles that are larger than a pencil eraser, or moles that have changed in shape or color.

## 2017-06-17 NOTE — Progress Notes (Signed)
PCP notes:   Health maintenance:  A1C - completed   Pt declines all vaccines at this time.  Abnormal screenings:   None  Patient concerns:   None  Nurse concerns:  None  Next PCP appt:   06/22/17 @ 0830

## 2017-06-19 NOTE — Progress Notes (Signed)
I reviewed health advisor's note, was available for consultation, and agree with documentation and plan.  

## 2017-06-22 ENCOUNTER — Ambulatory Visit (INDEPENDENT_AMBULATORY_CARE_PROVIDER_SITE_OTHER): Payer: Medicare HMO | Admitting: Family Medicine

## 2017-06-22 ENCOUNTER — Encounter: Payer: Self-pay | Admitting: Family Medicine

## 2017-06-22 VITALS — BP 120/80 | HR 86 | Temp 97.7°F | Ht 67.5 in | Wt 156.2 lb

## 2017-06-22 DIAGNOSIS — Z7189 Other specified counseling: Secondary | ICD-10-CM

## 2017-06-22 DIAGNOSIS — R319 Hematuria, unspecified: Secondary | ICD-10-CM

## 2017-06-22 DIAGNOSIS — E785 Hyperlipidemia, unspecified: Secondary | ICD-10-CM | POA: Diagnosis not present

## 2017-06-22 DIAGNOSIS — E119 Type 2 diabetes mellitus without complications: Secondary | ICD-10-CM

## 2017-06-22 DIAGNOSIS — Z23 Encounter for immunization: Secondary | ICD-10-CM | POA: Diagnosis not present

## 2017-06-22 DIAGNOSIS — Z0000 Encounter for general adult medical examination without abnormal findings: Secondary | ICD-10-CM | POA: Diagnosis not present

## 2017-06-22 LAB — POC URINALSYSI DIPSTICK (AUTOMATED)
BILIRUBIN UA: NEGATIVE
Blood, UA: NEGATIVE
GLUCOSE UA: POSITIVE — AB
Ketones, UA: NEGATIVE
LEUKOCYTES UA: NEGATIVE
NITRITE UA: NEGATIVE
PH UA: 6 (ref 5.0–8.0)
Protein, UA: NEGATIVE
Spec Grav, UA: 1.02 (ref 1.010–1.025)
UROBILINOGEN UA: 0.2 U/dL

## 2017-06-22 MED ORDER — LISINOPRIL 2.5 MG PO TABS: 2.5000 mg | ORAL_TABLET | Freq: Every day | ORAL | 3 refills | Status: DC

## 2017-06-22 MED ORDER — GLIPIZIDE 5 MG PO TABS: 5.0000 mg | ORAL_TABLET | Freq: Two times a day (BID) | ORAL | 3 refills | Status: DC

## 2017-06-22 MED ORDER — PIOGLITAZONE HCL 15 MG PO TABS: 15.0000 mg | ORAL_TABLET | Freq: Every day | ORAL | 3 refills | Status: DC

## 2017-06-22 MED ORDER — EMPAGLIFLOZIN-METFORMIN HCL 5-1000 MG PO TABS: 1.0000 | ORAL_TABLET | Freq: Two times a day (BID) | ORAL | 11 refills | Status: DC

## 2017-06-22 MED ORDER — SIMVASTATIN 40 MG PO TABS: 20.0000 mg | ORAL_TABLET | Freq: Every day | ORAL | 3 refills | Status: DC

## 2017-06-22 NOTE — Assessment & Plan Note (Signed)
Preventative protocols reviewed and updated unless pt declined. Discussed healthy diet and lifestyle.  

## 2017-06-22 NOTE — Assessment & Plan Note (Addendum)
Chronic, stable. Continue current regimen.  F/u 6 mo DM check.

## 2017-06-22 NOTE — Patient Instructions (Addendum)
If interested, check with pharmacy about new 2 shot shingles series (shingrix).  Prevnar today.  Bring [name] copy of your advanced directive.  You are doing well today, return as needed or in 6 months for diabetes check.  Health Maintenance, Male A healthy lifestyle and preventive care is important for your health and wellness. Ask your health care provider about what schedule of regular examinations is right for you. What should I know about weight and diet? Eat a Healthy Diet  Eat plenty of vegetables, fruits, whole grains, low-fat dairy products, and lean protein.  Do not eat a lot of foods high in solid fats, added sugars, or salt.  Maintain a Healthy Weight Regular exercise can help you achieve or maintain a healthy weight. You should:  Do at least 150 minutes of exercise each week. The exercise should increase your heart rate and make you sweat (moderate-intensity exercise).  Do strength-training exercises at least twice a week.  Watch Your Levels of Cholesterol and Blood Lipids  Have your blood tested for lipids and cholesterol every 5 years starting at 68 years of age. If you are at high risk for heart disease, you should start having your blood tested when you are 68 years old. You may need to have your cholesterol levels checked more often if: ? Your lipid or cholesterol levels are high. ? You are older than 68 years of age. ? You are at high risk for heart disease.  What should I know about cancer screening? Many types of cancers can be detected early and may often be prevented. Lung Cancer  You should be screened every year for lung cancer if: ? You are a current smoker who has smoked for at least 30 years. ? You are a former smoker who has quit within the past 15 years.  Talk to your health care provider about your screening options, when you should start screening, and how often you should be screened.  Colorectal Cancer  Routine colorectal cancer screening usually  begins at 68 years of age and should be repeated every 5-10 years until you are 68 years old. You may need to be screened more often if early forms of precancerous polyps or small growths are found. Your health care provider may recommend screening at an earlier age if you have risk factors for colon cancer.  Your health care provider may recommend using home test kits to check for hidden blood in the stool.  A small camera at the end of a tube can be used to examine your colon (sigmoidoscopy or colonoscopy). This checks for the earliest forms of colorectal cancer.  Prostate and Testicular Cancer  Depending on your age and overall health, your health care provider may do certain tests to screen for prostate and testicular cancer.  Talk to your health care provider about any symptoms or concerns you have about testicular or prostate cancer.  Skin Cancer  Check your skin from head to toe regularly.  Tell your health care provider about any new moles or changes in moles, especially if: ? There is a change in a mole's size, shape, or color. ? You have a mole that is larger than a pencil eraser.  Always use sunscreen. Apply sunscreen liberally and repeat throughout the day.  Protect yourself by wearing long sleeves, pants, a wide-brimmed hat, and sunglasses when outside.  What should I know about heart disease, diabetes, and high blood pressure?  If you are 49-61 years of age, have your blood  pressure checked every 3-5 years. If you are 83 years of age or older, have your blood pressure checked every year. You should have your blood pressure measured twice-once when you are at a hospital or clinic, and once when you are not at a hospital or clinic. Record the average of the two measurements. To check your blood pressure when you are not at a hospital or clinic, you can use: ? An automated blood pressure machine at a pharmacy. ? A home blood pressure monitor.  Talk to your health care  provider about your target blood pressure.  If you are between 90-9 years old, ask your health care provider if you should take aspirin to prevent heart disease.  Have regular diabetes screenings by checking your fasting blood sugar level. ? If you are at a normal weight and have a low risk for diabetes, have this test once every three years after the age of 5. ? If you are overweight and have a high risk for diabetes, consider being tested at a younger age or more often.  A one-time screening for abdominal aortic aneurysm (AAA) by ultrasound is recommended for men aged 56-75 years who are current or former smokers. What should I know about preventing infection? Hepatitis B If you have a higher risk for hepatitis B, you should be screened for this virus. Talk with your health care provider to find out if you are at risk for hepatitis B infection. Hepatitis C Blood testing is recommended for:  Everyone born from [birth year] through 1966.  Anyone with known risk factors for hepatitis C.  Sexually Transmitted Diseases (STDs)  You should be screened each year for STDs including gonorrhea and chlamydia if: ? You are sexually active and are younger than 68 years of age. ? You are older than 68 years of age and your health care provider tells you that you are at risk for this type of infection. ? Your sexual activity has changed since you were last screened and you are at an increased risk for chlamydia or gonorrhea. Ask your health care provider if you are at risk.  Talk with your health care provider about whether you are at high risk of being infected with HIV. Your health care provider may recommend a prescription medicine to help prevent HIV infection.  What else can I do?  Schedule regular health, dental, and eye exams.  Stay current with your vaccines (immunizations).  Do not use any tobacco products, such as cigarettes, chewing tobacco, and e-cigarettes. If you need help quitting, ask  your health care provider.  Limit alcohol intake to no more than 2 drinks per day. One drink equals 12 ounces of beer, 5 ounces of wine, or 1 ounces of hard liquor.  Do not use street drugs.  Do not share needles.  Ask your health care provider for help if you need support or information about quitting drugs.  Tell your health care provider if you often feel depressed.  Tell your health care provider if you have ever been abused or do not feel safe at home. This information is not intended to replace advice given to you by your health care provider. Make sure you discuss any questions you have with your health care provider. Document Released: 07/04/2007 Document Revised: 09/04/2015 Document Reviewed: 10/09/2014 Elsevier Interactive Patient Education  [employer].

## 2017-06-22 NOTE — Assessment & Plan Note (Signed)
Update UA in setting of actos use. No recent hematuria.  

## 2017-06-22 NOTE — Assessment & Plan Note (Signed)
Advanced directive discussion - has at home. HCPOA would be wife. Will check at home and bring [name] copy.

## 2017-06-22 NOTE — Progress Notes (Signed)
BP 120/80 (BP Location: Left Arm, Patient Position: Sitting, Cuff Size: Normal)   Pulse 86   Temp 97.7 F (36.5 C) (Oral)   Ht 5' 7.5" (1.715 m)   Wt 156 lb 4 oz (70.9 kg)   SpO2 99%   BMI 24.11 kg/m    CC: CPE Subjective:    Patient ID: Eduardo Pierce, male    DOB: 12/20/1950, 67 y.o.   MRN: [id number]  HPI: Eduardo Pierce is a 67 y.o. male presenting on 06/22/2017 for Annual Exam (Pt 2.)   Saw [name] last week for medicare wellness visit. Note reviewed.    Preventative: Colon screening - 06/08/2011. Prolapse type polyp, rpt due 10 yrs Ardis Hughs).  Prostate - normal PSA today (0.25). Yearly check.  Lung cancer screening - quit 1988 Influenza - declines  Pneumonia - declines at this time but will consider Td - 2006. zostavax 09/2012.  shingrix - discussed.  Advanced directive discussion - has at home. HCPOA would be wife. Will check at home and bring [name] copy.  Seat belt use discussed Sunscreen use discussed. No changing moles on skin. Sees derm yearly.  Ex smoker. Quit 1988. Wife smokes at home. Alcohol - none Dentist - Q49mo Eye exam - Dr Katy Fitch - 01/2017  "[name]"  Hobbies [employer] pumps Married lives wife Retired 2014 3 children, 1 out of home, 1 grandchild Activity: Exercises at Y 2-3 times per week - aerobic  Diet: good water, fruits/vegetables daily. Sugar free diet   Relevant past medical, surgical, family and social history reviewed and updated as indicated. Interim medical history since our last visit reviewed. Allergies and medications reviewed and updated. Outpatient Medications Prior to Visit  Medication Sig Dispense Refill  . aspirin EC 81 MG tablet Take 81 mg by mouth daily.    . Cyanocobalamin (VITAMIN B-12 CR) 1000 MCG TBCR Take 1 tablet by mouth daily.     Marland Kitchen FREESTYLE LITE test strip TEST BLOOD SUGAR EVERY DAY AND AS NEEDED 300 each 3  . Lancets MISC Use to check blood sugar daily     . vitamin C (ASCORBIC ACID) 500 MG tablet Take 500 mg by mouth  daily.      Marland Kitchen glipiZIDE (GLUCOTROL) 5 MG tablet Take 1 tablet (5 mg total) by mouth 2 (two) times daily before a meal. 180 tablet 1  . lisinopril (PRINIVIL,ZESTRIL) 2.5 MG tablet Take 1 tablet (2.5 mg total) by mouth daily. NEEDS PHYSICAL EXAM 90 tablet 0  . pioglitazone (ACTOS) 15 MG tablet TAKE 1 TABLET (15 MG TOTAL) BY MOUTH DAILY. 90 tablet 0  . simvastatin (ZOCOR) 40 MG tablet TAKE 1/2 TABLET BY MOUTH AT BEDTIME 45 tablet 3  . SYNJARDY 05-998 MG TABS TAKE 1 TABLET BY MOUTH TWICE A DAY 60 tablet 3   No facility-administered medications prior to visit.      Per HPI unless specifically indicated in ROS section below Review of Systems  Constitutional: Negative for activity change, appetite change, chills, fatigue, fever and unexpected weight change.  HENT: Negative for hearing loss.   Eyes: Negative for visual disturbance.  Respiratory: Negative for cough, chest tightness, shortness of breath and wheezing.   Cardiovascular: Negative for chest pain, palpitations and leg swelling.  Gastrointestinal: Negative for abdominal distention, abdominal pain, blood in stool, constipation, diarrhea, nausea and vomiting.  Genitourinary: Negative for difficulty urinating and hematuria.  Musculoskeletal: Negative for arthralgias, myalgias and neck pain.  Skin: Negative for rash.  Neurological: Negative for dizziness, seizures, syncope  and headaches.  Hematological: Negative for adenopathy. Bruises/bleeds easily.  Psychiatric/Behavioral: Negative for dysphoric mood. The patient is not nervous/anxious.        Objective:    BP 120/80 (BP Location: Left Arm, Patient Position: Sitting, Cuff Size: Normal)   Pulse 86   Temp 97.7 F (36.5 C) (Oral)   Ht 5' 7.5" (1.715 m)   Wt 156 lb 4 oz (70.9 kg)   SpO2 99%   BMI 24.11 kg/m   Wt Readings from Last 3 Encounters:  06/22/17 156 lb 4 oz (70.9 kg)  06/17/17 154 lb 12 oz (70.2 kg)  12/07/16 161 lb (73 kg)    Physical Exam  Constitutional: He is  oriented to person, place, and time. He appears well-developed and well-nourished. No distress.  HENT:  Head: Normocephalic and atraumatic.  Right Ear: Hearing, tympanic membrane, external ear and ear canal normal.  Left Ear: Hearing, tympanic membrane, external ear and ear canal normal.  Nose: Nose normal.  Mouth/Throat: Uvula is midline, oropharynx is clear and moist and mucous membranes are normal. No oropharyngeal exudate, posterior oropharyngeal edema or posterior oropharyngeal erythema.  Eyes: Pupils are equal, round, and reactive to light. Conjunctivae and EOM are normal. No scleral icterus.  Neck: Normal range of motion. Neck supple. Carotid bruit is not present. No thyromegaly present.  Cardiovascular: Normal rate, regular rhythm, normal heart sounds and intact distal pulses.  No murmur heard. Pulses:      Radial pulses are 2+ on the right side, and 2+ on the left side.  Pulmonary/Chest: Effort normal and breath sounds normal. No respiratory distress. He has no wheezes. He has no rales.  Abdominal: Soft. Bowel sounds are normal. He exhibits no distension and no mass. There is no tenderness. There is no rebound and no guarding.  Genitourinary: Rectum normal and prostate normal. Rectal exam shows no external hemorrhoid, no internal hemorrhoid, no fissure, no mass, no tenderness and anal tone normal. Prostate is not enlarged (10gm) and not tender.  Musculoskeletal: Normal range of motion. He exhibits no edema.  Lymphadenopathy:    He has no cervical adenopathy.  Neurological: He is alert and oriented to person, place, and time.  CN grossly intact, station and gait intact  Skin: Skin is warm and dry. No rash noted.  Psychiatric: He has a normal mood and affect. His behavior is normal. Judgment and thought content normal.  Nursing note and vitals reviewed.  Results for orders placed or performed in visit on 06/17/17  PSA, Medicare  Result Value Ref Range   PSA 0.20 0.10 - 4.00 ng/ml    Hemoglobin A1c  Result Value Ref Range   Hgb A1c MFr Bld 6.8 (H) 4.6 - 6.5 %  Comprehensive metabolic panel  Result Value Ref Range   Sodium 138 135 - 145 mEq/L   Potassium 4.4 3.5 - 5.1 mEq/L   Chloride 104 96 - 112 mEq/L   CO2 26 19 - 32 mEq/L   Glucose, Bld 84 70 - 99 mg/dL   BUN 18 6 - 23 mg/dL   Creatinine, Ser 1.09 0.40 - 1.50 mg/dL   Total Bilirubin 0.7 0.2 - 1.2 mg/dL   Alkaline Phosphatase 40 39 - 117 U/L   AST 14 0 - 37 U/L   ALT 13 0 - 53 U/L   Total Protein 6.7 6.0 - 8.3 g/dL   Albumin 4.6 3.5 - 5.2 g/dL   Calcium 9.3 8.4 - 10.5 mg/dL   GFR 71.62 >60.00 mL/min  Lipid panel  Result Value Ref Range   Cholesterol 129 0 - 200 mg/dL   Triglycerides 121.0 0.0 - 149.0 mg/dL   HDL 44.60 >39.00 mg/dL   VLDL 24.2 0.0 - 40.0 mg/dL   LDL Cholesterol 60 0 - 99 mg/dL   Total CHOL/HDL Ratio 3    NonHDL 84.56       Assessment & Plan:   Problem List Items Addressed This Visit    Advanced care planning/counseling discussion    Advanced directive discussion - has at home. HCPOA would be wife. Will check at home and bring [name] copy.       Diabetes mellitus type 2, controlled, without complications (HCC)    Chronic, stable. Continue current regimen.  F/u 6 mo DM check.      Relevant Medications   simvastatin (ZOCOR) 40 MG tablet   glipiZIDE (GLUCOTROL) 5 MG tablet   lisinopril (PRINIVIL,ZESTRIL) 2.5 MG tablet   pioglitazone (ACTOS) 15 MG tablet   Empagliflozin-metFORMIN HCl (SYNJARDY) 05-998 MG TABS   Health maintenance examination - Primary    Preventative protocols reviewed and updated unless pt declined. Discussed healthy diet and lifestyle.       Hematuria    Update UA in setting of actos use.  No recent hematuria.       Relevant Orders   POCT Urinalysis Dipstick (Automated)   HLD (hyperlipidemia)    Chronic, stable. Continue simvastatin.  The ASCVD Risk score Mikey Bussing DC Jr., et al., 2013) failed to calculate for the following reasons:   The valid total  cholesterol range is 130 to 320 mg/dL       Relevant Medications   simvastatin (ZOCOR) 40 MG tablet   lisinopril (PRINIVIL,ZESTRIL) 2.5 MG tablet    Other Visit Diagnoses    Need for vaccination with 13-polyvalent pneumococcal conjugate vaccine       Relevant Orders   Pneumococcal conjugate vaccine 13-valent IM (Completed)       Meds ordered this encounter  Medications  . simvastatin (ZOCOR) 40 MG tablet    Sig: Take 0.5 tablets (20 mg total) by mouth at bedtime.    Dispense:  45 tablet    Refill:  3  . glipiZIDE (GLUCOTROL) 5 MG tablet    Sig: Take 1 tablet (5 mg total) by mouth 2 (two) times daily before a meal.    Dispense:  180 tablet    Refill:  3  . lisinopril (PRINIVIL,ZESTRIL) 2.5 MG tablet    Sig: Take 1 tablet (2.5 mg total) by mouth daily.    Dispense:  90 tablet    Refill:  3  . pioglitazone (ACTOS) 15 MG tablet    Sig: Take 1 tablet (15 mg total) by mouth daily.    Dispense:  90 tablet    Refill:  3  . Empagliflozin-metFORMIN HCl (SYNJARDY) 05-998 MG TABS    Sig: Take 1 tablet by mouth 2 (two) times daily.    Dispense:  60 tablet    Refill:  11   Orders Placed This Encounter  Procedures  . Pneumococcal conjugate vaccine 13-valent IM  . POCT Urinalysis Dipstick (Automated)    Follow up plan: Return in about 6 months (around 12/22/2017) for follow up visit.  Ria Bush, MD

## 2017-06-22 NOTE — Assessment & Plan Note (Addendum)
Chronic, stable. Continue simvastatin. The ASCVD Risk score (Goff DC Jr., et al., 2013) failed to calculate for the following reasons:   The valid total cholesterol range is 130 to 320 mg/dL  

## 2017-08-21 IMAGING — US US SCROTUM
1 series · 13 of 25 positions shown · non-contrast
Comparison: None.

CLINICAL DATA: Question of right scrotal mass over the last 2 weeks

EXAM:
ULTRASOUND OF SCROTUM
TECHNIQUE: Complete ultrasound examination of the testicles, epididymis, and
other scrotal structures was performed.

[Series 1: us scrotum · 0.06mm/px · 90 acquisitions, 13 frames shown]
[im 1/90]
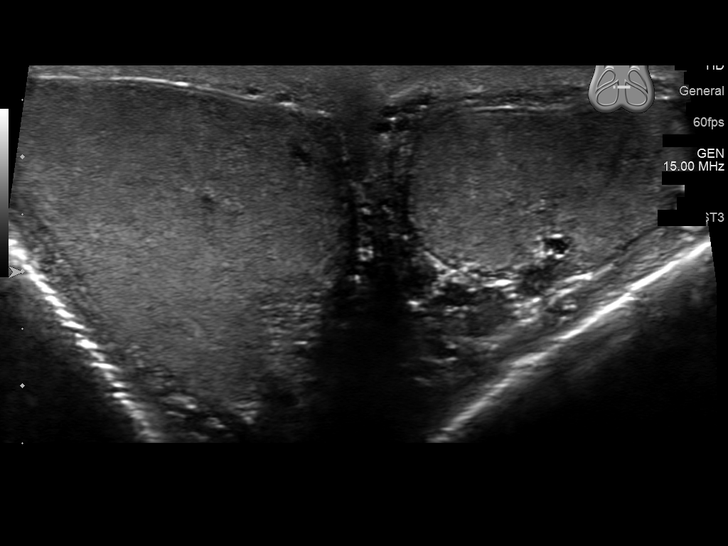
[im 8/90]
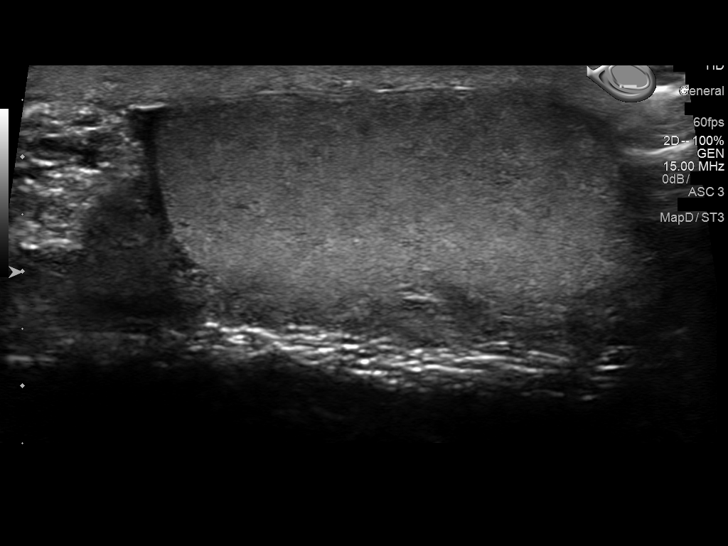
[im 15/90]
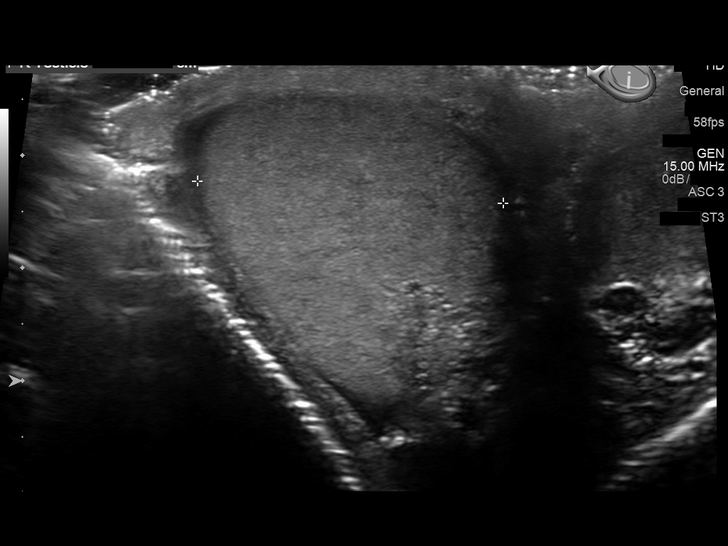
[im 23/90]
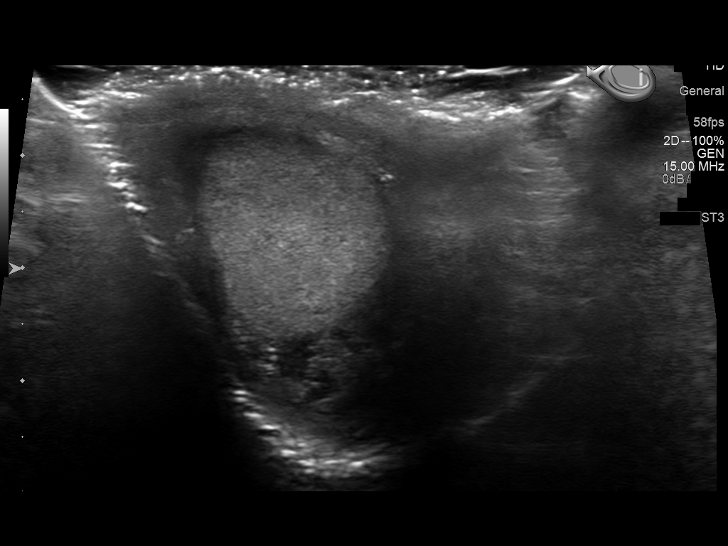
[im 30/90]
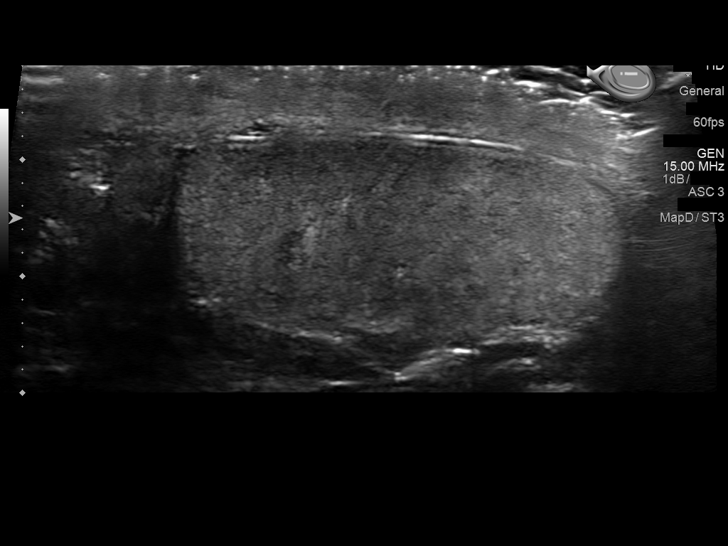
[im 38/90]
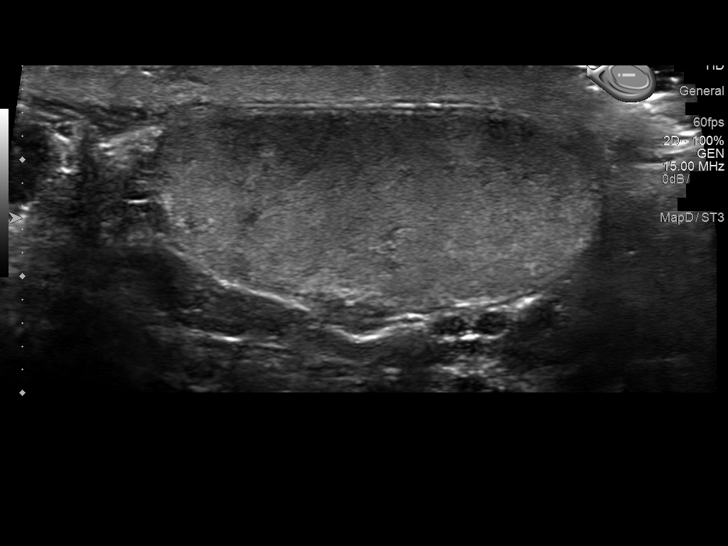
[im 45/90]
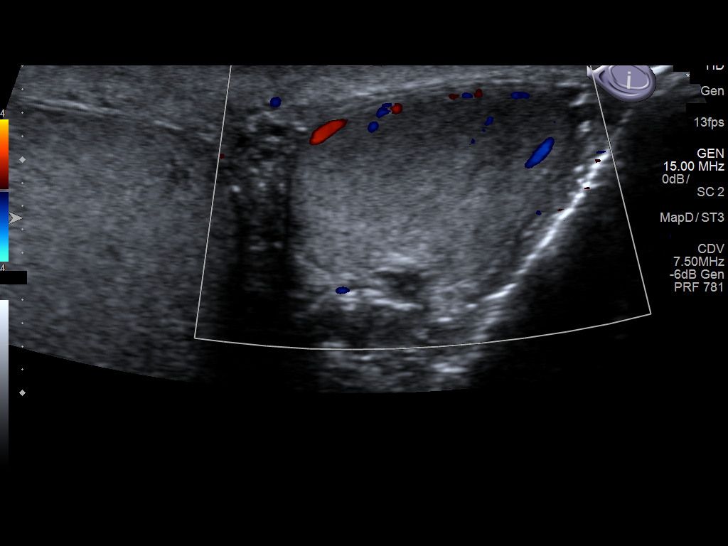
[im 52/90]
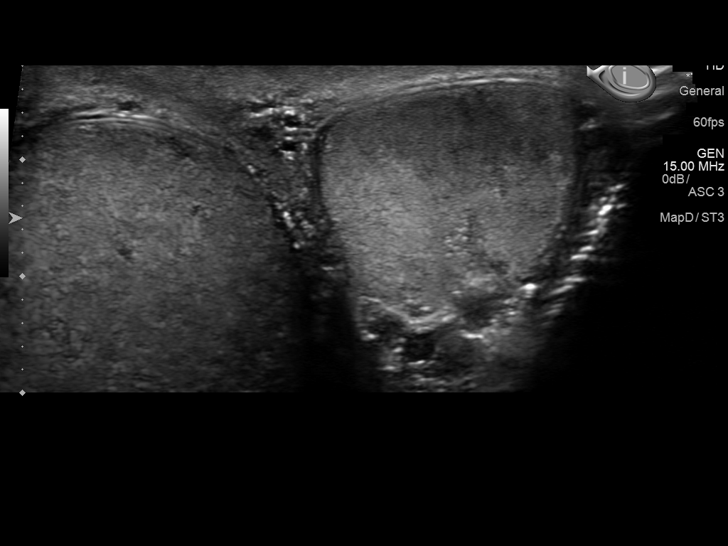
[im 60/90]
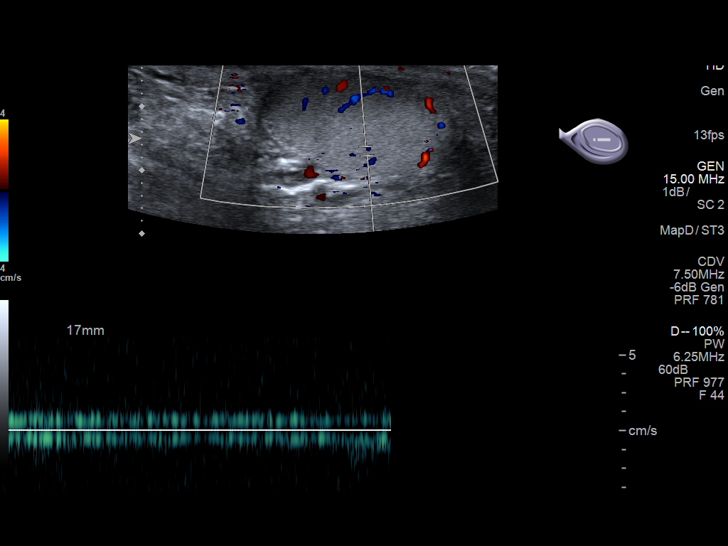
[im 67/90]
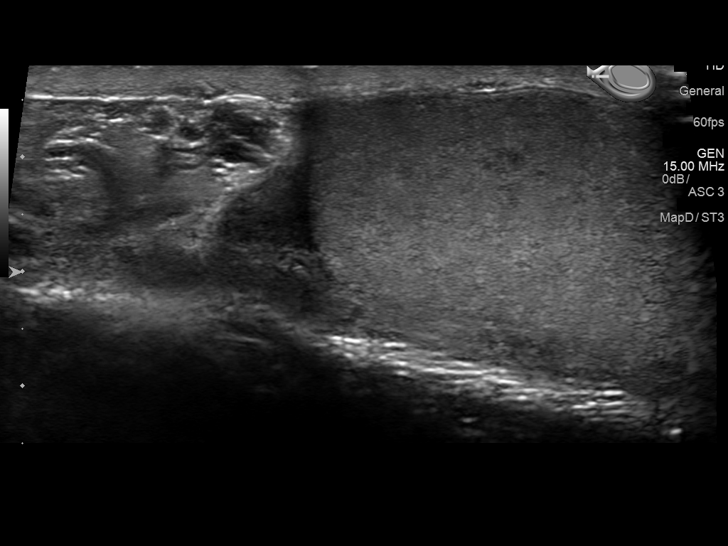
[im 75/90]
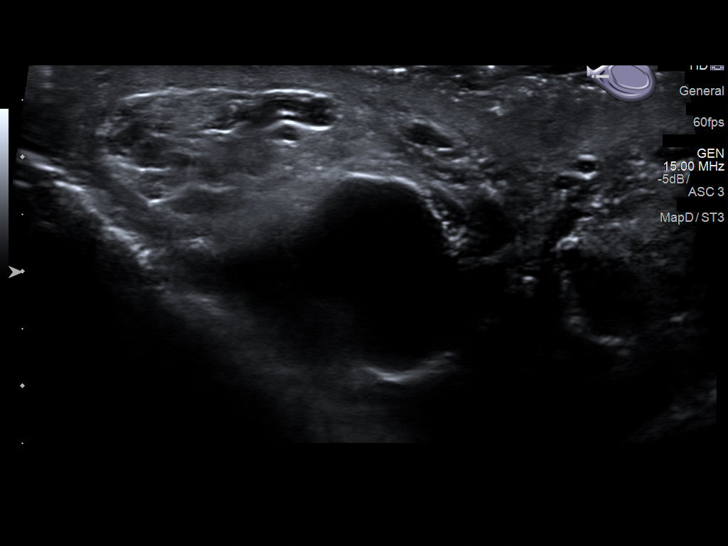
[im 82/90]
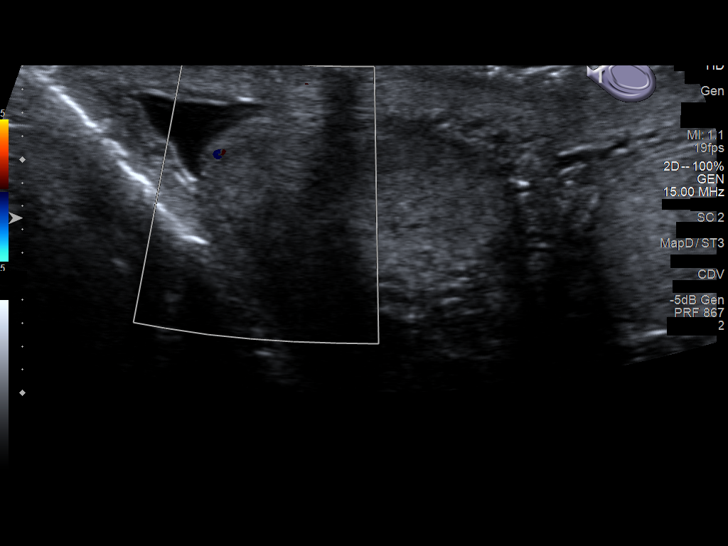
[im 90/90]
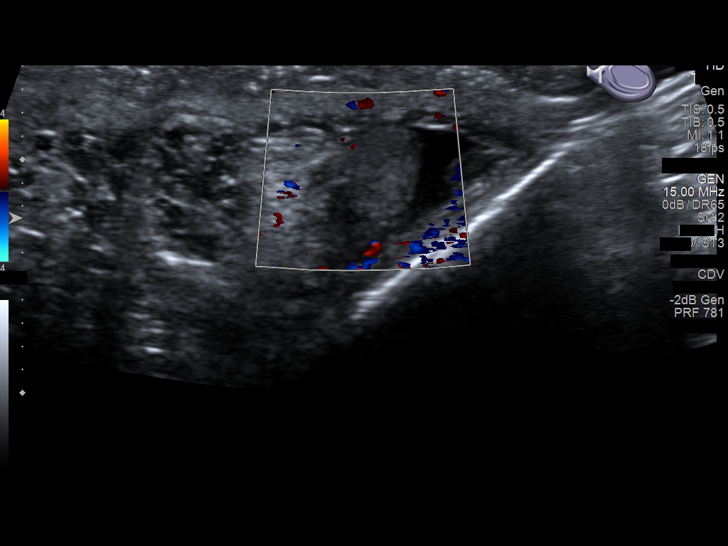

[13 of 25 positions shown; findings below may reference images not displayed]

FINDINGS: Right testicle

Measurements: The right testicle measures 4.7 x 2.7 x 2.7 cm.. No
intratesticular abnormality is seen. Blood flow is demonstrated to
the right testicle with arterial and venous waveforms.

Left testicle

Measurements: The left testicle measures 3.9 x 1.7 x 2.2 cm.. No
intratesticular abnormality is seen. No discrete lesion is seen.
Blood flow is demonstrated to the left testicle with arterial and
venous waveforms. There may be small calcifications present.

Right epididymis: Several cystic structures are present involving
the right epididymis possibly representing a cluster of epididymal
cysts with the largest of 3.7 x 2.0 x 2.1 cm. No internal
echogenicity is seen to indicate spermatocele.

Left epididymis:  The left epididymis is unremarkable.

Hydrocele:  There is a small amount of fluid bilaterally.

Varicocele:  No varicocele is seen.
IMPRESSION: 1. No intratesticular abnormality is seen. Blood flow is
demonstrated to both testicles.
2. The prominence on the right scrotum appears to be due to right
epididymal cysts as noted above versus spermatocele.

## 2017-11-22 DIAGNOSIS — R69 Illness, unspecified: Secondary | ICD-10-CM | POA: Diagnosis not present

## 2017-12-10 DIAGNOSIS — R69 Illness, unspecified: Secondary | ICD-10-CM | POA: Diagnosis not present

## 2018-01-02 NOTE — Progress Notes (Signed)
BP 120/70 (BP Location: Left Arm, Patient Position: Sitting, Cuff Size: Normal)   Pulse 75   Temp 97.6 F (36.4 C) (Oral)   Ht 5' 7.5" (1.715 m)   Wt 157 lb 8 oz (71.4 kg)   SpO2 100%   BMI 24.30 kg/m    CC: 6 mo f/u visit Subjective:    Patient ID: Eduardo Pierce, male    DOB: 1950-12-04, 67 y.o.   MRN: [id number]  HPI: Eduardo Pierce is a 67 y.o. male presenting on 01/03/2018 for Diabetes (Here for 6 mo f/u.)   DM - does regularly check sugars 90-130 fasting. Compliant with antihyperglycemic regimen which includes: glipizide 5mg  bid, actos 15mg  daily, synjardy 05/998 bid (empagliflozin/metformin). Denies low sugars or hypoglycemic symptoms. Denies paresthesias. Last diabetic eye exam 01/2017. Pneumovax: 06/2017. Prevnar: due next year. Glucometer brand: freestyle. DSME: declines at this time.  Lab Results  Component Value Date   HGBA1C 6.3 (A) 01/03/2018   Diabetic Foot Exam - Simple   Simple Foot Form Diabetic Foot exam was performed with the following findings:  Yes 01/02/2018  8:20 PM  Visual Inspection No deformities, no ulcerations, no other skin breakdown bilaterally:  Yes See comments:  Yes Sensation Testing Intact to touch and monofilament testing bilaterally:  Yes Pulse Check Posterior Tibialis and Dorsalis pulse intact bilaterally:  Yes Comments Calluses bilateral medial feet    Lab Results  Component Value Date   MICROALBUR <0.7 09/17/2015     HLD - tolerating simvastatin well without side effects.  Relevant past medical, surgical, family and social history reviewed and updated as indicated. Interim medical history since our last visit reviewed. Allergies and medications reviewed and updated. Outpatient Medications Prior to Visit  Medication Sig Dispense Refill  . aspirin EC 81 MG tablet Take 81 mg by mouth daily.    . Cyanocobalamin (VITAMIN B-12 CR) 1000 MCG TBCR Take 1 tablet by mouth daily.     . Empagliflozin-metFORMIN HCl (SYNJARDY) 05-998 MG  TABS Take 1 tablet by mouth 2 (two) times daily. 60 tablet 11  . FREESTYLE LITE test strip TEST BLOOD SUGAR EVERY DAY AND AS NEEDED 300 each 3  . glipiZIDE (GLUCOTROL) 5 MG tablet Take 1 tablet (5 mg total) by mouth 2 (two) times daily before a meal. 180 tablet 3  . Lancets MISC Use to check blood sugar daily     . lisinopril (PRINIVIL,ZESTRIL) 2.5 MG tablet Take 1 tablet (2.5 mg total) by mouth daily. 90 tablet 3  . pioglitazone (ACTOS) 15 MG tablet Take 1 tablet (15 mg total) by mouth daily. 90 tablet 3  . simvastatin (ZOCOR) 40 MG tablet Take 0.5 tablets (20 mg total) by mouth at bedtime. 45 tablet 3  . vitamin C (ASCORBIC ACID) 500 MG tablet Take 500 mg by mouth daily.       No facility-administered medications prior to visit.      Per HPI unless specifically indicated in ROS section below Review of Systems     Objective:    BP 120/70 (BP Location: Left Arm, Patient Position: Sitting, Cuff Size: Normal)   Pulse 75   Temp 97.6 F (36.4 C) (Oral)   Ht 5' 7.5" (1.715 m)   Wt 157 lb 8 oz (71.4 kg)   SpO2 100%   BMI 24.30 kg/m   Wt Readings from Last 3 Encounters:  01/03/18 157 lb 8 oz (71.4 kg)  06/22/17 156 lb 4 oz (70.9 kg)  06/17/17 154 lb 12 oz (70.2  kg)    Physical Exam Vitals signs and nursing note reviewed.  Constitutional:      General: He is not in acute distress.    Appearance: He is well-developed.  HENT:     Head: Normocephalic and atraumatic.     Right Ear: External ear normal.     Left Ear: External ear normal.     Nose: Nose normal.     Mouth/Throat:     Pharynx: No oropharyngeal exudate.  Eyes:     General: No scleral icterus.    Conjunctiva/sclera: Conjunctivae normal.     Pupils: Pupils are equal, round, and reactive to light.  Neck:     Musculoskeletal: Normal range of motion and neck supple.  Cardiovascular:     Rate and Rhythm: Normal rate and regular rhythm.     Heart sounds: Normal heart sounds. No murmur.  Pulmonary:     Effort: Pulmonary  effort is normal. No respiratory distress.     Breath sounds: Normal breath sounds. No wheezing or rales.  Musculoskeletal:     Comments: See HPI for foot exam if done  Lymphadenopathy:     Cervical: No cervical adenopathy.  Skin:    General: Skin is warm and dry.     Findings: No rash.    Results for orders placed or performed in visit on 01/03/18  POCT glycosylated hemoglobin (Hb A1C)  Result Value Ref Range   Hemoglobin A1C 6.3 (A) 4.0 - 5.6 %   HbA1c POC (<> result, manual entry)     HbA1c, POC (prediabetic range)     HbA1c, POC (controlled diabetic range)        Assessment & Plan:   Problem List Items Addressed This Visit    HLD (hyperlipidemia)    Chronic, stable. Continue simvastatin       Diabetes mellitus type 2, controlled, without complications (HCC) - Primary    Chronic, stable. Well controlled. Continue current regimen. He will let me know if desires to price out individual components of synjardy (currently can get expensive).       Relevant Orders   POCT glycosylated hemoglobin (Hb A1C) (Completed)       No orders of the defined types were placed in this encounter.  Orders Placed This Encounter  Procedures  . POCT glycosylated hemoglobin (Hb A1C)    Follow up plan: Return in about 6 months (around 07/05/2018) for annual exam, prior fasting for blood work, medicare wellness visit.  Ria Bush, MD

## 2018-01-03 ENCOUNTER — Encounter: Payer: Self-pay | Admitting: Family Medicine

## 2018-01-03 ENCOUNTER — Ambulatory Visit (INDEPENDENT_AMBULATORY_CARE_PROVIDER_SITE_OTHER): Payer: Medicare HMO | Admitting: Family Medicine

## 2018-01-03 VITALS — BP 120/70 | HR 75 | Temp 97.6°F | Ht 67.5 in | Wt 157.5 lb

## 2018-01-03 DIAGNOSIS — E119 Type 2 diabetes mellitus without complications: Secondary | ICD-10-CM | POA: Diagnosis not present

## 2018-01-03 DIAGNOSIS — E785 Hyperlipidemia, unspecified: Secondary | ICD-10-CM | POA: Diagnosis not present

## 2018-01-03 LAB — POCT GLYCOSYLATED HEMOGLOBIN (HGB A1C): Hemoglobin A1C: 6.3 % — AB (ref 4.0–5.6)

## 2018-01-03 NOTE — Assessment & Plan Note (Signed)
Chronic, stable. Well controlled. Continue current regimen. He will let me know if desires to price out individual components of synjardy (currently can get expensive).

## 2018-01-03 NOTE — Patient Instructions (Addendum)
Return in January for flu shot.  You are doing well today Continue current medicines. Let me know if interested in pricing out individual components of synjardy.

## 2018-01-03 NOTE — Assessment & Plan Note (Addendum)
Chronic, stable. Continue simvastatin.  

## 2018-02-08 DIAGNOSIS — H25812 Combined forms of age-related cataract, left eye: Secondary | ICD-10-CM | POA: Diagnosis not present

## 2018-02-08 DIAGNOSIS — Z961 Presence of intraocular lens: Secondary | ICD-10-CM | POA: Diagnosis not present

## 2018-02-08 DIAGNOSIS — H40013 Open angle with borderline findings, low risk, bilateral: Secondary | ICD-10-CM | POA: Diagnosis not present

## 2018-02-08 DIAGNOSIS — E119 Type 2 diabetes mellitus without complications: Secondary | ICD-10-CM | POA: Diagnosis not present

## 2018-02-09 ENCOUNTER — Telehealth: Payer: Self-pay | Admitting: Family Medicine

## 2018-02-09 MED ORDER — METFORMIN HCL 1000 MG PO TABS: 1000.0000 mg | ORAL_TABLET | Freq: Two times a day (BID) | ORAL | 3 refills | Status: DC

## 2018-02-09 MED ORDER — EMPAGLIFLOZIN 10 MG PO TABS: 10.0000 mg | ORAL_TABLET | Freq: Every day | ORAL | 11 refills | Status: DC

## 2018-02-09 NOTE — Addendum Note (Signed)
Addended by: Ria Bush on: 02/09/2018 03:16 PM   Modules accepted: Orders

## 2018-02-09 NOTE — Telephone Encounter (Signed)
Left message on vm for pt to call back.  Need to relay Dr. G's message.  

## 2018-02-09 NOTE — Telephone Encounter (Addendum)
Have him price out individual components - sent in separately.  Would also have him see if insurance covers any alternative better that is in same family as jardiance (empagliflozin).

## 2018-02-09 NOTE — Telephone Encounter (Signed)
Pt is returning your call. Please call pt.

## 2018-02-09 NOTE — Telephone Encounter (Signed)
Spoke with pt relaying Dr. G's message.  Pt verbalizes understanding and expresses his thanks.  

## 2018-02-09 NOTE — Telephone Encounter (Signed)
Pt called office due to speaking with Dr.G about the Synjardy. Pt states due to his insurance the amount he has to pay out of pocket for the medication has went up tremendously. Pt says that Dr.G mentioned sending in 2 prescriptions to make it cheaper. Please advise

## 2018-03-22 DIAGNOSIS — Z23 Encounter for immunization: Secondary | ICD-10-CM | POA: Diagnosis not present

## 2018-03-22 DIAGNOSIS — L570 Actinic keratosis: Secondary | ICD-10-CM | POA: Diagnosis not present

## 2018-06-27 ENCOUNTER — Other Ambulatory Visit: Payer: Self-pay | Admitting: Family Medicine

## 2018-07-10 ENCOUNTER — Other Ambulatory Visit: Payer: Self-pay | Admitting: Family Medicine

## 2018-07-10 DIAGNOSIS — E119 Type 2 diabetes mellitus without complications: Secondary | ICD-10-CM

## 2018-07-10 DIAGNOSIS — Z125 Encounter for screening for malignant neoplasm of prostate: Secondary | ICD-10-CM

## 2018-07-10 DIAGNOSIS — E785 Hyperlipidemia, unspecified: Secondary | ICD-10-CM

## 2018-07-11 ENCOUNTER — Ambulatory Visit (INDEPENDENT_AMBULATORY_CARE_PROVIDER_SITE_OTHER): Payer: Medicare HMO

## 2018-07-11 ENCOUNTER — Other Ambulatory Visit (INDEPENDENT_AMBULATORY_CARE_PROVIDER_SITE_OTHER): Payer: Medicare HMO

## 2018-07-11 DIAGNOSIS — Z125 Encounter for screening for malignant neoplasm of prostate: Secondary | ICD-10-CM | POA: Diagnosis not present

## 2018-07-11 DIAGNOSIS — E785 Hyperlipidemia, unspecified: Secondary | ICD-10-CM | POA: Diagnosis not present

## 2018-07-11 DIAGNOSIS — Z0000 Encounter for general adult medical examination without abnormal findings: Secondary | ICD-10-CM

## 2018-07-11 DIAGNOSIS — E119 Type 2 diabetes mellitus without complications: Secondary | ICD-10-CM | POA: Diagnosis not present

## 2018-07-11 LAB — LIPID PANEL
Cholesterol: 131 mg/dL (ref 0–200)
HDL: 41.8 mg/dL (ref >39.00)
LDL Cholesterol: 68 mg/dL (ref 0–99)
NonHDL: 89.42
Total CHOL/HDL Ratio: 3
Triglycerides: 106 mg/dL (ref 0.0–149.0)
VLDL: 21.2 mg/dL (ref 0.0–40.0)

## 2018-07-11 LAB — COMPREHENSIVE METABOLIC PANEL
ALT: 13 U/L (ref 0–53)
AST: 15 U/L (ref 0–37)
Albumin: 4.5 g/dL (ref 3.5–5.2)
Alkaline Phosphatase: 45 U/L (ref 39–117)
BUN: 20 mg/dL (ref 6–23)
CO2: 26 mEq/L (ref 19–32)
Calcium: 9 mg/dL (ref 8.4–10.5)
Chloride: 105 mEq/L (ref 96–112)
Creatinine, Ser: 1.14 mg/dL (ref 0.40–1.50)
GFR: 63.78 mL/min (ref >60.00)
Glucose, Bld: 115 mg/dL — ABNORMAL HIGH (ref 70–99)
Potassium: 4.1 mEq/L (ref 3.5–5.1)
Sodium: 140 mEq/L (ref 135–145)
Total Bilirubin: 0.7 mg/dL (ref 0.2–1.2)
Total Protein: 6.3 g/dL (ref 6.0–8.3)

## 2018-07-11 LAB — PSA: PSA: 0.19 ng/mL (ref 0.10–4.00)

## 2018-07-11 LAB — HEMOGLOBIN A1C: Hgb A1c MFr Bld: 7 % — ABNORMAL HIGH (ref 4.6–6.5)

## 2018-07-11 NOTE — Progress Notes (Signed)
Subjective:   Eduardo Pierce is a 68 y.o. male who presents for Medicare Annual/Subsequent preventive examination.  Review of Systems:  N/A Cardiac Risk Factors include: advanced age (>36men, >39 women);diabetes mellitus;male gender     Objective:    Vitals: There were no vitals taken for this visit.  There is no height or weight on file to calculate BMI.  Advanced Directives 07/11/2018 06/17/2017 03/13/2011  Does Patient Have a Medical Advance Directive? Yes Yes Patient has advance directive, copy not in chart  Type of Advance Directive [place];Living will [place];Living will [place] in Chart? No - copy requested No - copy requested Copy requested from family  Pre-existing out of facility DNR order (yellow form or pink MOST form) - - No    Tobacco Social History   Tobacco Use  Smoking Status Former Smoker  . Packs/day: 0.50  . Years: 18.00  . Pack years: 9.00  . Types: Cigarettes  . Quit date: 01/19/1986  . Years since quitting: 32.4  Smokeless Tobacco Never Used     Counseling given: No   Clinical Intake:  Pre-visit preparation completed: Yes  Pain : No/denies pain Pain Score: 0-No pain     Nutritional Status: BMI 25 -29 Overweight Nutritional Risks: None Diabetes: No  How often do you need to have someone help you when you read instructions, pamphlets, or other written materials from your doctor or pharmacy?: 1 - Never What is the last grade level you completed in school?: Bachelor degree  Interpreter Needed?: No  Comments: pt lives with spouse Information entered by :: LPinson, RN  Past Medical History:  Diagnosis Date  . Diabetes mellitus type II 10/20/1995   completed DSME  . Hyperlipidemia 02/20/1996  . Leukopenia 03/2011   mild, normal periph smear, normal B12 and folate   Past Surgical History:  Procedure Laterality Date  . COLONOSCOPY  05/2011    prolapse type polyp, rpt due 10 yrs  . hospitalization  02/2011   vasovagal presyncope, some NSVT on tele  . nuclear stress echo  02/2011   no ischemia/infarct, normal LV wall motion, EF 68%   Family History  Problem Relation Age of Onset  . Hypertension Mother   . Diabetes Mother   . Stroke Mother   . Kidney disease Mother        failure-dialysis  . Heart disease Mother        MI  . Heart disease Other        CHF, CABG  . Cancer Other        lung  . Colon cancer Neg Hx    Social History   Socioeconomic History  . Marital status: Married    Spouse name: Not on file  . Number of children: 3  . Years of education: Not on file  . Highest education level: Not on file  Occupational History  . Occupation: [employer]    Employer: [employer]  Social Needs  . Financial resource strain: Not on file  . Food insecurity    Worry: Not on file    Inability: Not on file  . Transportation needs    Medical: Not on file    Non-medical: Not on file  Tobacco Use  . Smoking status: Former Smoker    Packs/day: 0.50    Years: 18.00    Pack years: 9.00  Types: Cigarettes    Quit date: 01/19/1986    Years since quitting: 32.4  . Smokeless tobacco: Never Used  Substance and Sexual Activity  . Alcohol use: No  . Drug use: No  . Sexual activity: Not Currently  Lifestyle  . Physical activity    Days per week: Not on file    Minutes per session: Not on file  . Stress: Not on file  Relationships  . Social Herbalist on phone: Not on file    Gets together: Not on file    Attends religious service: Not on file    Active member of club or organization: Not on file    Attends meetings of clubs or organizations: Not on file    Relationship status: Not on file  Other Topics Concern  . Not on file  Social History Narrative   "[name]"   Hobbies mgf rep pumps   Married lives wife   3 children, 1 out of home, 1 grandchild   Activity: Exercises at  Y 2-3 times per week - aerobic   Diet: good water, fruits/vegetables daily.  Sugar free diet.    Outpatient Encounter Medications as of 07/11/2018  Medication Sig  . aspirin EC 81 MG tablet Take 81 mg by mouth daily.  . Cyanocobalamin (VITAMIN B-12 CR) 1000 MCG TBCR Take 1 tablet by mouth daily.   . empagliflozin (JARDIANCE) 10 MG TABS tablet Take 10 mg by mouth daily.  . Empagliflozin-metFORMIN HCl (SYNJARDY) 05-998 MG TABS Take 1 tablet by mouth 2 (two) times daily.  Marland Kitchen FREESTYLE LITE test strip TEST BLOOD SUGAR EVERY DAY AND AS NEEDED  . glipiZIDE (GLUCOTROL) 5 MG tablet Take 1 tablet (5 mg total) by mouth 2 (two) times daily before a meal.  . Lancets MISC Use to check blood sugar daily   . lisinopril (PRINIVIL,ZESTRIL) 2.5 MG tablet Take 1 tablet (2.5 mg total) by mouth daily.  . metFORMIN (GLUCOPHAGE) 1000 MG tablet Take 1 tablet (1,000 mg total) by mouth 2 (two) times daily with a meal.  . pioglitazone (ACTOS) 15 MG tablet TAKE 1 TABLET BY MOUTH EVERY DAY  . simvastatin (ZOCOR) 40 MG tablet Take 0.5 tablets (20 mg total) by mouth at bedtime.  . vitamin C (ASCORBIC ACID) 500 MG tablet Take 500 mg by mouth daily.     No facility-administered encounter medications on file as of 07/11/2018.     Activities of Daily Living In your present state of health, do you have any difficulty performing the following activities: 07/11/2018  Hearing? N  Vision? N  Difficulty concentrating or making decisions? N  Walking or climbing stairs? N  Dressing or bathing? N  Doing errands, shopping? N  Preparing Food and eating ? N  Using the Toilet? N  In the past six months, have you accidently leaked urine? N  Do you have problems with loss of bowel control? N  Managing your Medications? N  Managing your Finances? N  Housekeeping or managing your Housekeeping? N  Some recent data might be hidden    Patient Care Team: Ria Bush, MD as PCP - General (Family Medicine)   Assessment:   This  is a routine wellness examination for Eduardo Pierce.   Hearing Screening   125Hz  250Hz  500Hz  1000Hz  2000Hz  3000Hz  4000Hz  6000Hz  8000Hz   Right ear:           Left ear:           Vision Screening Comments: Vision exam in  Jan 2020 with Dr. Katy Fitch   Exercise Activities and Dietary recommendations Current Exercise Habits: Home exercise routine, Type of exercise: walking, Time (Minutes): 30, Frequency (Times/Week): 3, Weekly Exercise (Minutes/Week): 90, Intensity: Mild, Exercise limited by: None identified  Goals    . Increase physical activity     Starting 07/11/18, I will continue to exercise for 30 minutes 3 days per week.        Fall Risk Fall Risk  07/11/2018 06/17/2017 05/20/2016 05/09/2015  Falls in the past year? 0 No No No   IDepression Screen PHQ 2/9 Scores 07/11/2018 06/17/2017 05/20/2016 05/09/2015  PHQ - 2 Score 0 0 0 0  PHQ- 9 Score 0 0 - -    Cognitive Function MMSE - Mini Mental State Exam 07/11/2018 06/17/2017  Orientation to time 5 5  Orientation to Place 5 5  Registration 3 3  Attention/ Calculation 0 0  Recall 3 3  Language- name 2 objects 0 0  Language- repeat 1 1  Language- follow 3 step command 0 3  Language- read & follow direction 0 0  Write a sentence 0 0  Copy design 0 0  Total score 17 20     PLEASE NOTE: A Mini-Cog screen was completed. Maximum score is 17. A value of 0 denotes this part of Folstein MMSE was not completed or the patient failed this part of the Mini-Cog screening.   Mini-Cog Screening Orientation to Time - Max 5 pts Orientation to Place - Max 5 pts Registration - Max 3 pts Recall - Max 3 pts Language Repeat - Max 1 pts      Immunization History  Administered Date(s) Administered  . Influenza Whole 12/02/2004  . Pneumococcal Conjugate-13 06/22/2017  . Td 11/19/2004  . Zoster 10/18/2012    Screening Tests Health Maintenance  Topic Date Due  . DTaP/Tdap/Td (1 - Tdap) 01/19/2020 (Originally 12/31/1969)  . TETANUS/TDAP  01/19/2020  (Originally 11/20/2014)  . PNA vac Low Risk Adult (2 of 2 - PPSV23) 01/19/2020 (Originally 06/23/2018)  . INFLUENZA VACCINE  08/20/2018  . FOOT EXAM  01/04/2019  . HEMOGLOBIN A1C  01/10/2019  . OPHTHALMOLOGY EXAM  02/03/2019  . COLONOSCOPY  06/07/2021  . Hepatitis C Screening  Completed       Plan:     I have personally reviewed, addressed, and noted the following in the patient's chart:  A. Medical and social history B. Use of alcohol, tobacco or illicit drugs  C. Current medications and supplements D. Functional ability and status E.  Nutritional status F.  Physical activity G. Advance directives H. List of other physicians I.  Hospitalizations, surgeries, and ER visits in previous 12 months J.  Vitals (unless it is a telemedicine encounter) K. Screenings to include cognitive, depression, hearing, vision (NOTE: hearing and vision screenings not completed in telemedicine encounter) L. Referrals and appointments   In addition, I have reviewed and discussed with patient certain preventive protocols, quality metrics, and best practice recommendations. A written personalized care plan for preventive services and recommendations were provided to patient.  With patient's permission, we connected on 07/11/18 at  8:00 AM EDT. Interactive audio and video telecommunications were attempted with patient. This attempt was unsuccessful due to patient having technical difficulties OR patient did not have access to video capability.  Encounter was completed with audio only.  Two patient identifiers were used to ensure the encounter occurred with the correct person. Patient was in home and writer was in office.     Signed,  Lindell Noe, MHA, BS, RN Health Coach

## 2018-07-11 NOTE — Progress Notes (Signed)
PCP notes:   Health maintenance:  Tetanus vaccine - postponed/insurance PPSV23 - to be determined Foot exam - to be determined A1C - completed   Abnormal screenings:   None  Patient concerns:   None  Nurse concerns:  None  Next PCP appt:   07/15/18 @ 0730

## 2018-07-11 NOTE — Patient Instructions (Signed)
Eduardo Pierce , Thank you for taking time to come for your Medicare Wellness Visit. I appreciate your ongoing commitment to your health goals. Please review the following plan we discussed and let me know if I can assist you in the future.   These are the goals we discussed: Goals    . Increase physical activity     Starting 07/11/18, I will continue to exercise for 30 minutes 3 days per week.        This is a list of the screening recommended for you and due dates:  Health Maintenance  Topic Date Due  . DTaP/Tdap/Td vaccine (1 - Tdap) 01/19/2020*  . Tetanus Vaccine  01/19/2020*  . Pneumonia vaccines (2 of 2 - PPSV23) 01/19/2020*  . Flu Shot  08/20/2018  . Complete foot exam   01/04/2019  . Hemoglobin A1C  01/10/2019  . Eye exam for diabetics  02/03/2019  . Colon Cancer Screening  06/07/2021  .  Hepatitis C: One time screening is recommended by Center for Disease Control  (CDC) for  adults born from [birth year] through 1966.   Completed  *Topic was postponed. The date shown is not the original due date.   Preventive Care for Adults  A healthy lifestyle and preventive care can promote health and wellness. Preventive health guidelines for adults include the following key practices.  . A routine yearly physical is a good way to check with your health care provider about your health and preventive screening. It is a chance to share any concerns and updates on your health and to receive a thorough exam.  . Visit your dentist for a routine exam and preventive care every 6 months. Brush your teeth twice a day and floss once a day. Good oral hygiene prevents tooth decay and gum disease.  . The frequency of eye exams is based on your age, health, family medical history, use  of contact lenses, and other factors. Follow your health care provider's recommendations for frequency of eye exams.  . Eat a healthy diet. Foods like vegetables, fruits, whole grains, low-fat dairy products, and lean protein  foods contain the nutrients you need without too many calories. Decrease your intake of foods high in solid fats, added sugars, and salt. Eat the right amount of calories for you. Get information about a proper diet from your health care provider, if necessary.  . Regular physical exercise is one of the most important things you can do for your health. Most adults should get at least 150 minutes of moderate-intensity exercise (any activity that increases your heart rate and causes you to sweat) each week. In addition, most adults need muscle-strengthening exercises on 2 or more days a week.  Silver Sneakers may be a benefit available to you. To determine eligibility, you may visit the website: www.silversneakers.com or contact program at 747-206-8571 Mon-Fri between 8AM-8PM.   . Maintain a healthy weight. The body mass index (BMI) is a screening tool to identify possible weight problems. It provides an estimate of body fat based on height and weight. Your health care provider can find your BMI and can help you achieve or maintain a healthy weight.   For adults 20 years and older: ? A BMI below 18.5 is considered underweight. ? A BMI of 18.5 to 24.9 is normal. ? A BMI of 25 to 29.9 is considered overweight. ? A BMI of 30 and above is considered obese.   . Maintain normal blood lipids and cholesterol levels by exercising and  minimizing your intake of saturated fat. Eat a balanced diet with plenty of fruit and vegetables. Blood tests for lipids and cholesterol should begin at age 33 and be repeated every 5 years. If your lipid or cholesterol levels are high, you are over 50, or you are at high risk for heart disease, you may need your cholesterol levels checked more frequently. Ongoing high lipid and cholesterol levels should be treated with medicines if diet and exercise are not working.  . If you smoke, find out from your health care provider how to quit. If you do not use tobacco, please do not  start.  . If you choose to drink alcohol, please do not consume more than 2 drinks per day. One drink is considered to be 12 ounces (355 mL) of beer, 5 ounces (148 mL) of wine, or 1.5 ounces (44 mL) of liquor.  . If you are 57-49 years old, ask your health care provider if you should take aspirin to prevent strokes.  . Use sunscreen. Apply sunscreen liberally and repeatedly throughout the day. You should seek shade when your shadow is shorter than you. Protect yourself by wearing long sleeves, pants, a wide-brimmed hat, and sunglasses year round, whenever you are outdoors.  . Once a month, do a whole body skin exam, using a mirror to look at the skin on your back. Tell your health care provider of new moles, moles that have irregular borders, moles that are larger than a pencil eraser, or moles that have changed in shape or color.

## 2018-07-14 ENCOUNTER — Encounter: Payer: Self-pay | Admitting: Family Medicine

## 2018-07-14 NOTE — Assessment & Plan Note (Deleted)
Preventative protocols reviewed and updated unless pt declined. Discussed healthy diet and lifestyle.  

## 2018-07-14 NOTE — Progress Notes (Signed)
appt created in error. His insurance doesn't cover virtual physicals.

## 2018-07-15 ENCOUNTER — Other Ambulatory Visit: Payer: Self-pay

## 2018-07-15 ENCOUNTER — Ambulatory Visit: Payer: Medicare HMO | Admitting: Family Medicine

## 2018-07-15 DIAGNOSIS — Z0000 Encounter for general adult medical examination without abnormal findings: Secondary | ICD-10-CM

## 2018-07-16 ENCOUNTER — Other Ambulatory Visit: Payer: Self-pay | Admitting: Family Medicine

## 2018-07-18 ENCOUNTER — Ambulatory Visit: Payer: Medicare HMO | Admitting: Family Medicine

## 2018-07-18 NOTE — Progress Notes (Signed)
I reviewed health advisor's note, was available for consultation, and agree with documentation and plan.  

## 2018-07-19 ENCOUNTER — Encounter: Payer: Self-pay | Admitting: Family Medicine

## 2018-07-19 ENCOUNTER — Other Ambulatory Visit: Payer: Self-pay

## 2018-07-19 ENCOUNTER — Ambulatory Visit (INDEPENDENT_AMBULATORY_CARE_PROVIDER_SITE_OTHER): Payer: Medicare HMO | Admitting: Family Medicine

## 2018-07-19 VITALS — BP 116/70 | HR 82 | Temp 97.8°F | Ht 67.5 in | Wt 155.4 lb

## 2018-07-19 DIAGNOSIS — D72819 Decreased white blood cell count, unspecified: Secondary | ICD-10-CM | POA: Diagnosis not present

## 2018-07-19 DIAGNOSIS — Z23 Encounter for immunization: Secondary | ICD-10-CM

## 2018-07-19 DIAGNOSIS — E119 Type 2 diabetes mellitus without complications: Secondary | ICD-10-CM

## 2018-07-19 DIAGNOSIS — S90421A Blister (nonthermal), right great toe, initial encounter: Secondary | ICD-10-CM | POA: Insufficient documentation

## 2018-07-19 DIAGNOSIS — E785 Hyperlipidemia, unspecified: Secondary | ICD-10-CM

## 2018-07-19 DIAGNOSIS — Z7189 Other specified counseling: Secondary | ICD-10-CM | POA: Diagnosis not present

## 2018-07-19 DIAGNOSIS — Z0000 Encounter for general adult medical examination without abnormal findings: Secondary | ICD-10-CM | POA: Diagnosis not present

## 2018-07-19 MED ORDER — SYNJARDY 5-1000 MG PO TABS: 1.0000 | ORAL_TABLET | Freq: Two times a day (BID) | ORAL | 3 refills | Status: DC

## 2018-07-19 MED ORDER — SIMVASTATIN 40 MG PO TABS: 20.0000 mg | ORAL_TABLET | Freq: Every day | ORAL | 3 refills | Status: DC

## 2018-07-19 MED ORDER — PIOGLITAZONE HCL 15 MG PO TABS: 15.0000 mg | ORAL_TABLET | Freq: Every day | ORAL | 3 refills | Status: DC

## 2018-07-19 MED ORDER — ASPIRIN EC 81 MG PO TBEC: 81.0000 mg | DELAYED_RELEASE_TABLET | ORAL | Status: AC

## 2018-07-19 MED ORDER — GLIPIZIDE 5 MG PO TABS: 5.0000 mg | ORAL_TABLET | Freq: Two times a day (BID) | ORAL | 3 refills | Status: DC

## 2018-07-19 MED ORDER — LISINOPRIL 2.5 MG PO TABS: 2.5000 mg | ORAL_TABLET | Freq: Every day | ORAL | 3 refills | Status: DC

## 2018-07-19 NOTE — Assessment & Plan Note (Signed)
Preventative protocols reviewed and updated unless pt declined. Discussed healthy diet and lifestyle.  

## 2018-07-19 NOTE — Assessment & Plan Note (Signed)
Chronic. Stable on current regimen. Continue.

## 2018-07-19 NOTE — Assessment & Plan Note (Signed)
Not infected today. Home care reviewed.

## 2018-07-19 NOTE — Patient Instructions (Addendum)
If interested, check with pharmacy about new 2 shot shingles series (shingrix).  Pneumovax today.  If you can, bring [name] a copy of your advanced directives to update chart.  Try backing off aspirin to every other day and watch easy bruising. Return as needed or in 6 months for diabetes follow up visit.   Health Maintenance After Age 69 After age 17, you are at a higher risk for certain long-term diseases and infections as well as injuries from falls. Falls are a major cause of broken bones and head injuries in people who are older than age 79. Getting regular preventive care can help to keep you healthy and well. Preventive care includes getting regular testing and making lifestyle changes as recommended by your health care provider. Talk with your health care provider about:  Which screenings and tests you should have. A screening is a test that checks for a disease when you have no symptoms.  A diet and exercise plan that is right for you. What should I know about screenings and tests to prevent falls? Screening and testing are the best ways to find a health problem early. Early diagnosis and treatment give you the best chance of managing medical conditions that are common after age 52. Certain conditions and lifestyle choices may make you more likely to have a fall. Your health care provider may recommend:  Regular vision checks. Poor vision and conditions such as cataracts can make you more likely to have a fall. If you wear glasses, make sure to get your prescription updated if your vision changes.  Medicine review. Work with your health care provider to regularly review all of the medicines you are taking, including over-the-counter medicines. Ask your health care provider about any side effects that may make you more likely to have a fall. Tell your health care provider if any medicines that you take make you feel dizzy or sleepy.  Osteoporosis screening. Osteoporosis is a condition that  causes the bones to get weaker. This can make the bones weak and cause them to break more easily.  Blood pressure screening. Blood pressure changes and medicines to control blood pressure can make you feel dizzy.  Strength and balance checks. Your health care provider may recommend certain tests to check your strength and balance while standing, walking, or changing positions.  Foot health exam. Foot pain and numbness, as well as not wearing proper footwear, can make you more likely to have a fall.  Depression screening. You may be more likely to have a fall if you have a fear of falling, feel emotionally low, or feel unable to do activities that you used to do.  Alcohol use screening. Using too much alcohol can affect your balance and may make you more likely to have a fall. What actions can I take to lower my risk of falls? General instructions  Talk with your health care provider about your risks for falling. Tell your health care provider if: ? You fall. Be sure to tell your health care provider about all falls, even ones that seem minor. ? You feel dizzy, sleepy, or off-balance.  Take over-the-counter and prescription medicines only as told by your health care provider. These include any supplements.  Eat a healthy diet and maintain a healthy weight. A healthy diet includes low-fat dairy products, low-fat (lean) meats, and fiber from whole grains, beans, and lots of fruits and vegetables. Home safety  Remove any tripping hazards, such as rugs, cords, and clutter.  Install  safety equipment such as grab bars in bathrooms and safety rails on stairs.  Keep rooms and walkways well-lit. Activity   Follow a regular exercise program to stay fit. This will help you maintain your balance. Ask your health care provider what types of exercise are appropriate for you.  If you need a cane or walker, use it as recommended by your health care provider.  Wear supportive shoes that have nonskid  soles. Lifestyle  Do not drink alcohol if your health care provider tells you not to drink.  If you drink alcohol, limit how much you have: ? 0-1 drink a day for women. ? 0-2 drinks a day for men.  Be aware of how much alcohol is in your drink. In the [place] one drink equals one typical bottle of beer (12 oz), one-half glass of wine (5 oz), or one shot of hard liquor (1 oz).  Do not use any products that contain nicotine or tobacco, such as cigarettes and e-cigarettes. If you need help quitting, ask your health care provider. Summary  Having a healthy lifestyle and getting preventive care can help to protect your health and wellness after age 43.  Screening and testing are the best way to find a health problem early and help you avoid having a fall. Early diagnosis and treatment give you the best chance for managing medical conditions that are more common for people who are older than age 1.  Falls are a major cause of broken bones and head injuries in people who are older than age 35. Take precautions to prevent a fall at home.  Work with your health care provider to learn what changes you can make to improve your health and wellness and to prevent falls. This information is not intended to replace advice given to you by your health care provider. Make sure you discuss any questions you have with your health care provider. Document Released: 11/18/2016 Document Revised: 04/28/2018 Document Reviewed: 11/18/2016 Elsevier Patient Education  2020 [employer].

## 2018-07-19 NOTE — Progress Notes (Signed)
This visit was conducted in person.  BP 116/70 (BP Location: Left Arm, Patient Position: Sitting, Cuff Size: Normal)   Pulse 82   Temp 97.8 F (36.6 C) (Tympanic)   Ht 5' 7.5" (1.715 m)   Wt 155 lb 7 oz (70.5 kg)   SpO2 96%   BMI 23.99 kg/m    CC: CPE Subjective:    Patient ID: Eduardo Pierce, male    DOB: 24-Dec-1950, 68 y.o.   MRN: [id number]  HPI: Eduardo Pierce is a 68 y.o. male presenting on 07/19/2018 for Annual Exam (Pt 2. )   Saw [name] last week for medicare wellness visit. Note reviewed.    Multiple tick bites this summer. Wants R great toe bite evaluated - worried it's gotten infected. Toe tick bite several weeks ago, very itchy since. Some bleeding from toe. Treating with anti-itch cream.   Preventative: Colon screening - 06/08/2011. Prolapse type polyp, rpt due 10 yrs Ardis Hughs).  Prostate - normal PSA today (0.25). Yearly check.  Lung cancer screening - quit 1988 Influenza - declines  Prevnar 06/2017. Pneumovax today Td - 2006.  zostavax 09/2012. shingrix - discussed. Advanced directive discussion - has at home.HCPOA would be wife.Will check at home and bring [name] copy.  Seat belt use discussed Sunscreen usediscussed. No changing moles on skin. Sees derm yearly - planned treatments for R cheek.  Ex smoker. Quit 1988. Wife smokes at home. 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 Sugar free diet     Relevant past medical, surgical, family and social history reviewed and updated as indicated. Interim medical history since our last visit reviewed. Allergies and medications reviewed and updated. Outpatient Medications Prior to Visit  Medication Sig Dispense Refill  . Cyanocobalamin  (VITAMIN B-12 CR) 1000 MCG TBCR Take 1 tablet by mouth daily.     Marland Kitchen FREESTYLE LITE test strip TEST BLOOD SUGAR EVERY DAY AND AS NEEDED 300 each 3  . Lancets MISC Use to check blood sugar daily     . vitamin C (ASCORBIC ACID) 500 MG tablet Take 500 mg by mouth daily.      Marland Kitchen aspirin EC 81 MG tablet Take 81 mg by mouth daily.    . empagliflozin (JARDIANCE) 10 MG TABS tablet Take 10 mg by mouth daily. 30 tablet 11  . glipiZIDE (GLUCOTROL) 5 MG tablet Take 1 tablet (5 mg total) by mouth 2 (two) times daily before a meal. 180 tablet 3  . lisinopril (PRINIVIL,ZESTRIL) 2.5 MG tablet Take 1 tablet (2.5 mg total) by mouth daily. 90 tablet 3  . pioglitazone (ACTOS) 15 MG tablet TAKE 1 TABLET BY MOUTH EVERY DAY 90 tablet 0  . simvastatin (ZOCOR) 40 MG tablet Take 0.5 tablets (20 mg total) by mouth at bedtime. 45 tablet 3  . SYNJARDY 05-998 MG TABS TAKE 1 TABLET BY MOUTH TWICE A DAY 60 tablet 0  . metFORMIN (GLUCOPHAGE) 1000 MG tablet Take 1 tablet (1,000 mg total) by mouth 2 (two) times daily with a meal. 180 tablet 3   No facility-administered medications prior to visit.      Per HPI unless specifically indicated in ROS section below Review of Systems  Constitutional: Negative for activity change,  appetite change, chills, fatigue, fever and unexpected weight change.  HENT: Negative for hearing loss.   Eyes: Negative for visual disturbance.  Respiratory: Negative for cough, chest tightness, shortness of breath and wheezing.   Cardiovascular: Negative for chest pain, palpitations and leg swelling.  Gastrointestinal: Negative for abdominal distention, abdominal pain, blood in stool, constipation, diarrhea, nausea and vomiting.  Genitourinary: Negative for difficulty urinating and hematuria.  Musculoskeletal: Negative for arthralgias, myalgias and neck pain.  Skin: Negative for rash.  Neurological: Negative for dizziness, seizures, syncope and headaches.  Hematological: Negative for adenopathy.  Bruises/bleeds easily.  Psychiatric/Behavioral: Negative for dysphoric mood. The patient is not nervous/anxious.    Objective:    BP 116/70 (BP Location: Left Arm, Patient Position: Sitting, Cuff Size: Normal)   Pulse 82   Temp 97.8 F (36.6 C) (Tympanic)   Ht 5' 7.5" (1.715 m)   Wt 155 lb 7 oz (70.5 kg)   SpO2 96%   BMI 23.99 kg/m   Wt Readings from Last 3 Encounters:  07/19/18 155 lb 7 oz (70.5 kg)  01/03/18 157 lb 8 oz (71.4 kg)  06/22/17 156 lb 4 oz (70.9 kg)    Physical Exam Vitals signs and nursing note reviewed.  Constitutional:      General: He is not in acute distress.    Appearance: Normal appearance. He is well-developed.  HENT:     Head: Normocephalic and atraumatic.     Right Ear: Hearing, tympanic membrane, ear canal and external ear normal.     Left Ear: Hearing, tympanic membrane, ear canal and external ear normal.     Nose: Nose normal.     Mouth/Throat:     Mouth: Mucous membranes are moist.     Pharynx: Uvula midline. No oropharyngeal exudate or posterior oropharyngeal erythema.  Eyes:     General: No scleral icterus.    Conjunctiva/sclera: Conjunctivae normal.     Pupils: Pupils are equal, round, and reactive to light.  Neck:     Musculoskeletal: Normal range of motion and neck supple.  Cardiovascular:     Rate and Rhythm: Normal rate and regular rhythm.     Pulses: Normal pulses.          Radial pulses are 2+ on the right side and 2+ on the left side.     Heart sounds: Normal heart sounds. No murmur.  Pulmonary:     Effort: Pulmonary effort is normal. No respiratory distress.     Breath sounds: Normal breath sounds. No wheezing, rhonchi or rales.  Abdominal:     General: Bowel sounds are normal. There is no distension.     Palpations: Abdomen is soft. There is no mass.     Tenderness: There is no abdominal tenderness. There is no guarding or rebound.  Genitourinary:    Prostate: Normal. Not enlarged (20gm), not tender and no nodules present.      Rectum: Normal. No mass, tenderness, anal fissure, external hemorrhoid or internal hemorrhoid. Normal anal tone.  Musculoskeletal: Normal range of motion.  Lymphadenopathy:     Cervical: No cervical adenopathy.  Skin:    General: Skin is warm and dry.     Capillary Refill: Capillary refill takes less than 2 seconds.     Findings: No rash.     Comments: R great toe proximal to nailbed with open blister at site of tick bite, no streaking erythema.   Neurological:     General: No focal deficit present.     Mental Status:  He is alert and oriented to person, place, and time.     Comments: CN grossly intact, station and gait intact  Psychiatric:        Mood and Affect: Mood normal.        Behavior: Behavior normal.        Thought Content: Thought content normal.        Judgment: Judgment normal.       Results for orders placed or performed in visit on 07/11/18  PSA  Result Value Ref Range   PSA 0.19 0.10 - 4.00 ng/mL  Hemoglobin A1c  Result Value Ref Range   Hgb A1c MFr Bld 7.0 (H) 4.6 - 6.5 %  Lipid panel  Result Value Ref Range   Cholesterol 131 0 - 200 mg/dL   Triglycerides 106.0 0.0 - 149.0 mg/dL   HDL 41.80 >39.00 mg/dL   VLDL 21.2 0.0 - 40.0 mg/dL   LDL Cholesterol 68 0 - 99 mg/dL   Total CHOL/HDL Ratio 3    NonHDL 89.42   Comprehensive metabolic panel  Result Value Ref Range   Sodium 140 135 - 145 mEq/L   Potassium 4.1 3.5 - 5.1 mEq/L   Chloride 105 96 - 112 mEq/L   CO2 26 19 - 32 mEq/L   Glucose, Bld 115 (H) 70 - 99 mg/dL   BUN 20 6 - 23 mg/dL   Creatinine, Ser 1.14 0.40 - 1.50 mg/dL   Total Bilirubin 0.7 0.2 - 1.2 mg/dL   Alkaline Phosphatase 45 39 - 117 U/L   AST 15 0 - 37 U/L   ALT 13 0 - 53 U/L   Total Protein 6.3 6.0 - 8.3 g/dL   Albumin 4.5 3.5 - 5.2 g/dL   Calcium 9.0 8.4 - 10.5 mg/dL   GFR 63.78 >60.00 mL/min   Lab Results  Component Value Date   WBC 4.1 04/25/2015   HGB 14.0 04/25/2015   HCT 40.6 04/25/2015   MCV 94.0 04/25/2015   PLT 176.0  04/25/2015    Assessment & Plan:   Problem List Items Addressed This Visit    Leukopenia    Will check CBC next labs.       HLD (hyperlipidemia)    Chronic, stable. Continue simvastatin. The 10-year ASCVD risk score Mikey Bussing DC Brooke Bonito., et al., 2013) is: 24.2%   Values used to calculate the score:     Age: 80 years     Sex: Male     Is Non-Hispanic African American: No     Diabetic: Yes     Tobacco smoker: No     Systolic Blood Pressure: 622 mmHg     Is BP treated: Yes     HDL Cholesterol: 41.8 mg/dL     Total Cholesterol: 131 mg/dL       Relevant Medications   lisinopril (ZESTRIL) 2.5 MG tablet   simvastatin (ZOCOR) 40 MG tablet   aspirin EC 81 MG tablet   Health maintenance examination - Primary    Preventative protocols reviewed and updated unless pt declined. Discussed healthy diet and lifestyle.       Diabetes mellitus type 2, controlled, without complications (HCC)    Chronic. Stable on current regimen. Continue.       Relevant Medications   Empagliflozin-metFORMIN HCl (SYNJARDY) 05-998 MG TABS   glipiZIDE (GLUCOTROL) 5 MG tablet   lisinopril (ZESTRIL) 2.5 MG tablet   pioglitazone (ACTOS) 15 MG tablet   simvastatin (ZOCOR) 40 MG tablet   aspirin EC 81 MG  tablet   Blister of great toe, right, initial encounter    Not infected today. Home care reviewed.       Advanced care planning/counseling discussion    Advanced directive discussion - has at home.HCPOA would be wife.Will check at home and bring [name] copy.        Other Visit Diagnoses    Need for 23-polyvalent pneumococcal polysaccharide vaccine       Relevant Orders   Pneumococcal polysaccharide vaccine 23-valent greater than or equal to 2yo subcutaneous/IM (Completed)       Meds ordered this encounter  Medications  . Empagliflozin-metFORMIN HCl (SYNJARDY) 05-998 MG TABS    Sig: Take 1 tablet by mouth 2 (two) times daily.    Dispense:  180 tablet    Refill:  3  . glipiZIDE (GLUCOTROL) 5 MG tablet     Sig: Take 1 tablet (5 mg total) by mouth 2 (two) times daily before a meal.    Dispense:  180 tablet    Refill:  3  . lisinopril (ZESTRIL) 2.5 MG tablet    Sig: Take 1 tablet (2.5 mg total) by mouth daily.    Dispense:  90 tablet    Refill:  3  . pioglitazone (ACTOS) 15 MG tablet    Sig: Take 1 tablet (15 mg total) by mouth daily.    Dispense:  90 tablet    Refill:  3  . simvastatin (ZOCOR) 40 MG tablet    Sig: Take 0.5 tablets (20 mg total) by mouth at bedtime.    Dispense:  45 tablet    Refill:  3  . aspirin EC 81 MG tablet    Sig: Take 1 tablet (81 mg total) by mouth every other day.   Orders Placed This Encounter  Procedures  . Pneumococcal polysaccharide vaccine 23-valent greater than or equal to 2yo subcutaneous/IM    Patient instructions: If interested, check with pharmacy about new 2 shot shingles series (shingrix).  Pneumovax today.  If you can, bring [name] a copy of your advanced directives to update chart.  Try backing off aspirin to every other day and watch easy bruising. Return as needed or in 6 months for diabetes follow up visit.   Follow up plan: Return in about 6 months (around 01/18/2019) for follow up visit.  Ria Bush, MD

## 2018-07-19 NOTE — Assessment & Plan Note (Signed)
Chronic, stable. Continue simvastatin. The 10-year ASCVD risk score Mikey Bussing DC Brooke Bonito., et al., 2013) is: 24.2%   Values used to calculate the score:     Age: 68 years     Sex: Male     Is Non-Hispanic African American: No     Diabetic: Yes     Tobacco smoker: No     Systolic Blood Pressure: 643 mmHg     Is BP treated: Yes     HDL Cholesterol: 41.8 mg/dL     Total Cholesterol: 131 mg/dL

## 2018-07-19 NOTE — Assessment & Plan Note (Signed)
Advanced directive discussion - has at home. HCPOA would be wife. Will check at home and bring us copy.  

## 2018-07-19 NOTE — Assessment & Plan Note (Signed)
Will check CBC next labs.

## 2018-09-13 ENCOUNTER — Telehealth: Payer: Self-pay

## 2018-09-13 NOTE — Telephone Encounter (Signed)
Yes I think that is a good idea. Do recommend shingrix shots (2 shot series).

## 2018-09-13 NOTE — Telephone Encounter (Signed)
Pt left v/m; pt was seen for annual exam on 07/19/18. Pt wants to verify that Dr Danise Mina wants pt to have the shingrix shot. Pt has scheduled appt at [facility] on 09/14/18 at 1PM to have the shingrix shot and pt wants to verify with Dr Danise Mina that he should have this shingrix shot. Pt request cb before tomorrow prior to pts appt. Please advise.

## 2018-09-13 NOTE — Telephone Encounter (Signed)
Left message on vm for pt to call back.  Need to relay Dr. G's message.  

## 2018-09-14 NOTE — Telephone Encounter (Signed)
Pt returning call.  I relayed Dr. G's message.  Pt verbalizes understanding.  

## 2019-01-24 ENCOUNTER — Ambulatory Visit (INDEPENDENT_AMBULATORY_CARE_PROVIDER_SITE_OTHER): Payer: Medicare HMO | Admitting: Family Medicine

## 2019-01-24 ENCOUNTER — Other Ambulatory Visit: Payer: Self-pay

## 2019-01-24 ENCOUNTER — Encounter: Payer: Self-pay | Admitting: Family Medicine

## 2019-01-24 VITALS — BP 122/80 | HR 87 | Temp 96.9°F | Ht 67.5 in | Wt 158.9 lb

## 2019-01-24 DIAGNOSIS — E119 Type 2 diabetes mellitus without complications: Secondary | ICD-10-CM | POA: Diagnosis not present

## 2019-01-24 DIAGNOSIS — E785 Hyperlipidemia, unspecified: Secondary | ICD-10-CM

## 2019-01-24 DIAGNOSIS — Z23 Encounter for immunization: Secondary | ICD-10-CM

## 2019-01-24 DIAGNOSIS — Z87448 Personal history of other diseases of urinary system: Secondary | ICD-10-CM

## 2019-01-24 DIAGNOSIS — E1169 Type 2 diabetes mellitus with other specified complication: Secondary | ICD-10-CM

## 2019-01-24 LAB — POCT URINALYSIS DIPSTICK
Bilirubin, UA: NEGATIVE
Blood, UA: NEGATIVE
Glucose, UA: POSITIVE — AB
Ketones, UA: NEGATIVE
Leukocytes, UA: NEGATIVE
Nitrite, UA: NEGATIVE
Protein, UA: NEGATIVE
Spec Grav, UA: 1.015 (ref 1.010–1.025)
Urobilinogen, UA: 0.2 E.U./dL
pH, UA: 6 (ref 5.0–8.0)

## 2019-01-24 LAB — POCT GLYCOSYLATED HEMOGLOBIN (HGB A1C): Hemoglobin A1C: 8.1 % — AB (ref 4.0–5.6)

## 2019-01-24 NOTE — Patient Instructions (Addendum)
Flu shot today.  Ok to get covid19 shot whenever able.  You are doing well today.  Continue current medicines.  Return as needed or in 6 months for physical.

## 2019-01-24 NOTE — Assessment & Plan Note (Signed)
Chronic, deteriorated. Not consistent with reported cbg's. I asked him to check sugars throughout the day not just fasting and send me readings in 2 wks to review. He will also renew efforts at low sugar low carb diet. Pt agrees with plan.  Continue current 4 drug regimen.

## 2019-01-24 NOTE — Assessment & Plan Note (Signed)
Update UA in setting of actos use. No recent hematuria.

## 2019-01-24 NOTE — Progress Notes (Signed)
This visit was conducted in person.  BP 122/80 (BP Location: Left Arm, Patient Position: Sitting, Cuff Size: Normal)   Pulse 87   Temp (!) 96.9 F (36.1 C) (Temporal)   Ht 5' 7.5" (1.715 m)   Wt 158 lb 14.4 oz (72.1 kg)   SpO2 98%   BMI 24.52 kg/m    CC: 6 mo f/u visit Subjective:    Patient ID: FALLOU BAHL, male    DOB: 10-Jun-1950, 69 y.o.   MRN: [id number]  HPI: KOTY MCLANAHAN is a 69 y.o. male presenting on 01/24/2019 for Follow-up (NO new concerns)   Completed shingrix vaccine late last year.  Requests flu shot today. We discussed covid vaccine.   DM - does regularly check sugars 90-115. Compliant with antihyperglycemic regimen which includes: actos 150mg  daily, synjardy 5/1000mg  bid, glipizide 5mg  bid. Denies low sugars or hypoglycemic symptoms. Denies paresthesias. Last diabetic eye exam 01/2018 - already has f/u scheduled. Pneumovax: 2020. Prevnar: 2019. Glucometer brand: freestyle lite. DSME: upon initial diagnosis. No UTI symptoms, yeast infection, groin infection symptoms.  Lab Results  Component Value Date   HGBA1C 8.1 (A) 01/24/2019   Diabetic Foot Exam - Simple   Simple Foot Form Diabetic Foot exam was performed with the following findings: Yes 01/24/2019  9:17 AM  Visual Inspection No deformities, no ulcerations, no other skin breakdown bilaterally: Yes Sensation Testing Intact to touch and monofilament testing bilaterally: Yes Pulse Check Posterior Tibialis and Dorsalis pulse intact bilaterally: Yes Comments    Lab Results  Component Value Date   MICROALBUR <0.7 09/17/2015        Relevant past medical, surgical, family and social history reviewed and updated as indicated. Interim medical history since our last visit reviewed. Allergies and medications reviewed and updated. Outpatient Medications Prior to Visit  Medication Sig Dispense Refill  . aspirin EC 81 MG tablet Take 1 tablet (81 mg total) by mouth every other day.    . Cyanocobalamin  (VITAMIN B-12 CR) 1000 MCG TBCR Take 1 tablet by mouth daily.     . Empagliflozin-metFORMIN HCl (SYNJARDY) 05-998 MG TABS Take 1 tablet by mouth 2 (two) times daily. 180 tablet 3  . FREESTYLE LITE test strip TEST BLOOD SUGAR EVERY DAY AND AS NEEDED 300 each 3  . glipiZIDE (GLUCOTROL) 5 MG tablet Take 1 tablet (5 mg total) by mouth 2 (two) times daily before a meal. 180 tablet 3  . Lancets MISC Use to check blood sugar daily     . lisinopril (ZESTRIL) 2.5 MG tablet Take 1 tablet (2.5 mg total) by mouth daily. 90 tablet 3  . pioglitazone (ACTOS) 15 MG tablet Take 1 tablet (15 mg total) by mouth daily. 90 tablet 3  . simvastatin (ZOCOR) 40 MG tablet Take 0.5 tablets (20 mg total) by mouth at bedtime. 45 tablet 3  . vitamin C (ASCORBIC ACID) 500 MG tablet Take 500 mg by mouth daily.       No facility-administered medications prior to visit.     Per HPI unless specifically indicated in ROS section below Review of Systems Objective:    BP 122/80 (BP Location: Left Arm, Patient Position: Sitting, Cuff Size: Normal)   Pulse 87   Temp (!) 96.9 F (36.1 C) (Temporal)   Ht 5' 7.5" (1.715 m)   Wt 158 lb 14.4 oz (72.1 kg)   SpO2 98%   BMI 24.52 kg/m   Wt Readings from Last 3 Encounters:  01/24/19 158 lb 14.4 oz (72.1 kg)  07/19/18 155 lb 7 oz (70.5 kg)  01/03/18 157 lb 8 oz (71.4 kg)    Physical Exam Vitals and nursing note reviewed.  Constitutional:      General: He is not in acute distress.    Appearance: He is well-developed.  HENT:     Head: Normocephalic and atraumatic.     Right Ear: External ear normal.     Left Ear: External ear normal.     Nose: Nose normal.     Mouth/Throat:     Pharynx: No oropharyngeal exudate.  Eyes:     General: No scleral icterus.    Conjunctiva/sclera: Conjunctivae normal.     Pupils: Pupils are equal, round, and reactive to light.  Cardiovascular:     Rate and Rhythm: Normal rate and regular rhythm.     Heart sounds: Normal heart sounds. No  murmur.  Pulmonary:     Effort: Pulmonary effort is normal. No respiratory distress.     Breath sounds: Normal breath sounds. No wheezing or rales.  Musculoskeletal:     Cervical back: Normal range of motion and neck supple.     Comments: See HPI for foot exam if done  Lymphadenopathy:     Cervical: No cervical adenopathy.  Skin:    General: Skin is warm and dry.     Findings: No rash.       Results for orders placed or performed in visit on 01/24/19  HgB A1c  POCT  Result Value Ref Range   Hemoglobin A1C 8.1 (A) 4.0 - 5.6 %   HbA1c POC (<> result, manual entry)     HbA1c, POC (prediabetic range)     HbA1c, POC (controlled diabetic range)    Urinalysis Dipstick  Result Value Ref Range   Color, UA light yellow    Clarity, UA clear    Glucose, UA Positive (A) Negative   Bilirubin, UA neg    Ketones, UA neg    Spec Grav, UA 1.015 1.010 - 1.025   Blood, UA neg    pH, UA 6.0 5.0 - 8.0   Protein, UA Negative Negative   Urobilinogen, UA 0.2 0.2 or 1.0 E.U./dL   Nitrite, UA neg    Leukocytes, UA Negative Negative   Appearance     Odor     Assessment & Plan:  This visit occurred during the SARS-CoV-2 public health emergency.  Safety protocols were in place, including screening questions prior to the visit, additional usage of staff PPE, and extensive cleaning of exam room while observing appropriate contact time as indicated for disinfecting solutions.   Problem List Items Addressed This Visit    Hyperlipidemia associated with type 2 diabetes mellitus ([place])   History of hematuria    Update UA in setting of actos use. No recent hematuria.       Relevant Orders   Urinalysis Dipstick (Completed)   Diabetes mellitus type 2, controlled, without complications ([place]) - Primary    Chronic, deteriorated. Not consistent with reported cbg's. I asked him to check sugars throughout the day not just fasting and send me readings in 2 wks to review. He will also renew efforts at low sugar  low carb diet. Pt agrees with plan.  Continue current 4 drug regimen.       Relevant Orders   HgB A1c  POCT (Completed)   Urinalysis Dipstick (Completed)    Other Visit Diagnoses    Need for influenza vaccination       Relevant Orders  Flu vaccine HIGH DOSE PF (Fluzone High dose) (Completed)       No orders of the defined types were placed in this encounter.  Orders Placed This Encounter  Procedures  . Flu vaccine HIGH DOSE PF (Fluzone High dose)  . HgB A1c  POCT  . Urinalysis Dipstick    Patient Instructions  Flu shot today.  Ok to get covid19 shot whenever able.  You are doing well today.  Continue current medicines.  Return as needed or in 6 months for physical.   Follow up plan: Return in about 6 months (around 07/24/2019), or if symptoms worsen or fail to improve, for annual exam, prior fasting for blood work, medicare wellness visit.  Ria Bush, MD

## 2019-02-09 DIAGNOSIS — Z961 Presence of intraocular lens: Secondary | ICD-10-CM | POA: Diagnosis not present

## 2019-02-09 DIAGNOSIS — H25812 Combined forms of age-related cataract, left eye: Secondary | ICD-10-CM | POA: Diagnosis not present

## 2019-02-09 DIAGNOSIS — H40013 Open angle with borderline findings, low risk, bilateral: Secondary | ICD-10-CM | POA: Diagnosis not present

## 2019-02-09 DIAGNOSIS — E119 Type 2 diabetes mellitus without complications: Secondary | ICD-10-CM | POA: Diagnosis not present

## 2019-02-09 DIAGNOSIS — H11131 Conjunctival pigmentations, right eye: Secondary | ICD-10-CM | POA: Diagnosis not present

## 2019-02-18 ENCOUNTER — Ambulatory Visit: Payer: Medicare HMO

## 2019-02-24 ENCOUNTER — Ambulatory Visit: Payer: Medicare HMO | Attending: Internal Medicine

## 2019-02-24 DIAGNOSIS — Z23 Encounter for immunization: Secondary | ICD-10-CM | POA: Insufficient documentation

## 2019-02-24 NOTE — Progress Notes (Signed)
   Covid-19 Vaccination Clinic  Name:  Eduardo Pierce    MRN: [id number] DOB: 1950/02/24  02/24/2019  Eduardo Pierce was observed post Covid-19 immunization for 30 minutes based on pre-vaccination screening without incidence. He was provided with Vaccine Information Sheet and instruction to access the V-Safe system.   Eduardo Pierce was instructed to call 911 with any severe reactions post vaccine: Marland Kitchen Difficulty breathing  . Swelling of your face and throat  . A fast heartbeat  . A bad rash all over your body  . Dizziness and weakness    Immunizations Administered    Name Date Dose VIS Date Route   Pfizer COVID-19 Vaccine 02/24/2019 12:56 PM 0.3 mL 12/30/2018 Intramuscular   Manufacturer: [place]   Lot: [id number]   [place]: [id number]

## 2019-03-01 ENCOUNTER — Ambulatory Visit: Payer: Medicare HMO

## 2019-03-01 DIAGNOSIS — E119 Type 2 diabetes mellitus without complications: Secondary | ICD-10-CM | POA: Diagnosis not present

## 2019-03-01 DIAGNOSIS — R7309 Other abnormal glucose: Secondary | ICD-10-CM | POA: Diagnosis not present

## 2019-03-01 LAB — HEMOGLOBIN A1C: Hemoglobin A1C: 7.1

## 2019-03-15 ENCOUNTER — Telehealth: Payer: Self-pay | Admitting: Family Medicine

## 2019-03-15 NOTE — Telephone Encounter (Signed)
plz notify - I [facility] this month with improved A1c to 7.1%. Sugar log also shows improving sugar control- continue current meds and diabetic diet.  Maybe our previous elevated A1c to 8% was more due to recent holidays.

## 2019-03-15 NOTE — Telephone Encounter (Signed)
Lvm asking pt to call back.  Need to relay Dr. G's message.  

## 2019-03-16 NOTE — Telephone Encounter (Signed)
Pt returning my call.  I relayed Dr. Synthia Innocent message.  He verbalizes understanding.

## 2019-03-21 ENCOUNTER — Ambulatory Visit: Payer: Medicare HMO | Attending: Internal Medicine

## 2019-03-21 DIAGNOSIS — Z23 Encounter for immunization: Secondary | ICD-10-CM

## 2019-03-21 NOTE — Progress Notes (Signed)
   Covid-19 Vaccination Clinic  Name:  Eduardo Pierce    MRN: [id number] DOB: 11-08-1950  03/21/2019  Eduardo Pierce was observed post Covid-19 immunization for 30 minutes based on pre-vaccination screening without incident. He was provided with Vaccine Information Sheet and instruction to access the V-Safe system.   Eduardo Pierce was instructed to call 911 with any severe reactions post vaccine: Marland Kitchen Difficulty breathing  . Swelling of face and throat  . A fast heartbeat  . A bad rash all over body  . Dizziness and weakness   Immunizations Administered    Name Date Dose VIS Date Route   Pfizer COVID-19 Vaccine 03/21/2019 12:49 PM 0.3 mL 12/30/2018 Intramuscular   Manufacturer: [place]   Lot: [id number]   [place]: [id number]

## 2019-03-21 NOTE — Progress Notes (Signed)
   Covid-19 Vaccination Clinic  Name:  Eduardo Pierce    MRN: [id number] DOB: September 03, 1950  03/21/2019  [name] was observed post Covid-19 immunization for 15 minutes without incident. He was provided with Vaccine Information Sheet and instruction to access the V-Safe system.   [name] was instructed to call 911 with any severe reactions post vaccine: Marland Kitchen Difficulty breathing  . Swelling of face and throat  . A fast heartbeat  . A bad rash all over body  . Dizziness and weakness   Immunizations Administered    Name Date Dose VIS Date Route   Pfizer COVID-19 Vaccine 03/21/2019 12:49 PM 0.3 mL 12/30/2018 Intramuscular   Manufacturer: [place]   Lot: [id number]   [place]: [id number]

## 2019-04-27 DIAGNOSIS — L570 Actinic keratosis: Secondary | ICD-10-CM | POA: Diagnosis not present

## 2019-05-15 DIAGNOSIS — L570 Actinic keratosis: Secondary | ICD-10-CM | POA: Diagnosis not present

## 2019-06-06 DIAGNOSIS — H25812 Combined forms of age-related cataract, left eye: Secondary | ICD-10-CM | POA: Diagnosis not present

## 2019-06-06 DIAGNOSIS — Z961 Presence of intraocular lens: Secondary | ICD-10-CM | POA: Diagnosis not present

## 2019-06-06 DIAGNOSIS — H11131 Conjunctival pigmentations, right eye: Secondary | ICD-10-CM | POA: Diagnosis not present

## 2019-07-10 DIAGNOSIS — L570 Actinic keratosis: Secondary | ICD-10-CM | POA: Diagnosis not present

## 2019-07-27 ENCOUNTER — Other Ambulatory Visit: Payer: Self-pay | Admitting: Family Medicine

## 2019-07-27 ENCOUNTER — Telehealth: Payer: Self-pay

## 2019-07-27 ENCOUNTER — Other Ambulatory Visit (INDEPENDENT_AMBULATORY_CARE_PROVIDER_SITE_OTHER): Payer: Medicare HMO

## 2019-07-27 ENCOUNTER — Ambulatory Visit: Payer: Medicare HMO

## 2019-07-27 ENCOUNTER — Other Ambulatory Visit: Payer: Self-pay

## 2019-07-27 DIAGNOSIS — E119 Type 2 diabetes mellitus without complications: Secondary | ICD-10-CM | POA: Diagnosis not present

## 2019-07-27 DIAGNOSIS — E785 Hyperlipidemia, unspecified: Secondary | ICD-10-CM | POA: Diagnosis not present

## 2019-07-27 DIAGNOSIS — E1169 Type 2 diabetes mellitus with other specified complication: Secondary | ICD-10-CM | POA: Diagnosis not present

## 2019-07-27 DIAGNOSIS — D72819 Decreased white blood cell count, unspecified: Secondary | ICD-10-CM

## 2019-07-27 DIAGNOSIS — Z125 Encounter for screening for malignant neoplasm of prostate: Secondary | ICD-10-CM

## 2019-07-27 LAB — COMPREHENSIVE METABOLIC PANEL
ALT: 13 U/L (ref 0–53)
AST: 14 U/L (ref 0–37)
Albumin: 4.6 g/dL (ref 3.5–5.2)
Alkaline Phosphatase: 47 U/L (ref 39–117)
BUN: 20 mg/dL (ref 6–23)
CO2: 27 mEq/L (ref 19–32)
Calcium: 9.1 mg/dL (ref 8.4–10.5)
Chloride: 105 mEq/L (ref 96–112)
Creatinine, Ser: 1.2 mg/dL (ref 0.40–1.50)
GFR: 59.93 mL/min — ABNORMAL LOW (ref >60.00)
Glucose, Bld: 121 mg/dL — ABNORMAL HIGH (ref 70–99)
Potassium: 4 mEq/L (ref 3.5–5.1)
Sodium: 140 mEq/L (ref 135–145)
Total Bilirubin: 0.7 mg/dL (ref 0.2–1.2)
Total Protein: 6.4 g/dL (ref 6.0–8.3)

## 2019-07-27 LAB — LIPID PANEL
Cholesterol: 137 mg/dL (ref 0–200)
HDL: 41.2 mg/dL (ref >39.00)
LDL Cholesterol: 72 mg/dL (ref 0–99)
NonHDL: 96.11
Total CHOL/HDL Ratio: 3
Triglycerides: 120 mg/dL (ref 0.0–149.0)
VLDL: 24 mg/dL (ref 0.0–40.0)

## 2019-07-27 LAB — CBC WITH DIFFERENTIAL/PLATELET
Basophils Absolute: 0.1 10*3/uL (ref 0.0–0.1)
Basophils Relative: 1.4 % (ref 0.0–3.0)
Eosinophils Absolute: 0.2 10*3/uL (ref 0.0–0.7)
Eosinophils Relative: 5 % (ref 0.0–5.0)
HCT: 43 % (ref 39.0–52.0)
Hemoglobin: 14.7 g/dL (ref 13.0–17.0)
Lymphocytes Relative: 22.7 % (ref 12.0–46.0)
Lymphs Abs: 0.9 10*3/uL (ref 0.7–4.0)
MCHC: 34.1 g/dL (ref 30.0–36.0)
MCV: 97.1 fl (ref 78.0–100.0)
Monocytes Absolute: 0.4 10*3/uL (ref 0.1–1.0)
Monocytes Relative: 10.6 % (ref 3.0–12.0)
Neutro Abs: 2.3 10*3/uL (ref 1.4–7.7)
Neutrophils Relative %: 60.3 % (ref 43.0–77.0)
Platelets: 177 10*3/uL (ref 150.0–400.0)
RBC: 4.43 Mil/uL (ref 4.22–5.81)
RDW: 13.5 % (ref 11.5–15.5)
WBC: 3.9 10*3/uL — ABNORMAL LOW (ref 4.0–10.5)

## 2019-07-27 LAB — PSA: PSA: 0.19 ng/mL (ref 0.10–4.00)

## 2019-07-27 LAB — HEMOGLOBIN A1C: Hgb A1c MFr Bld: 6.9 % — ABNORMAL HIGH (ref 4.6–6.5)

## 2019-07-27 NOTE — Telephone Encounter (Signed)
Called patient to complete his Medicare visit. Patient stated that he was busy right now and did not have time. Appointment cancelled per patient request.

## 2019-08-03 ENCOUNTER — Encounter: Payer: Self-pay | Admitting: Family Medicine

## 2019-08-03 ENCOUNTER — Other Ambulatory Visit: Payer: Self-pay

## 2019-08-03 ENCOUNTER — Ambulatory Visit (INDEPENDENT_AMBULATORY_CARE_PROVIDER_SITE_OTHER): Payer: Medicare HMO | Admitting: Family Medicine

## 2019-08-03 VITALS — BP 120/64 | HR 84 | Temp 97.6°F | Ht 67.5 in | Wt 149.2 lb

## 2019-08-03 DIAGNOSIS — E785 Hyperlipidemia, unspecified: Secondary | ICD-10-CM

## 2019-08-03 DIAGNOSIS — R3915 Urgency of urination: Secondary | ICD-10-CM

## 2019-08-03 DIAGNOSIS — Z0000 Encounter for general adult medical examination without abnormal findings: Secondary | ICD-10-CM | POA: Diagnosis not present

## 2019-08-03 DIAGNOSIS — D72819 Decreased white blood cell count, unspecified: Secondary | ICD-10-CM

## 2019-08-03 DIAGNOSIS — E1169 Type 2 diabetes mellitus with other specified complication: Secondary | ICD-10-CM

## 2019-08-03 DIAGNOSIS — Z7189 Other specified counseling: Secondary | ICD-10-CM

## 2019-08-03 DIAGNOSIS — E119 Type 2 diabetes mellitus without complications: Secondary | ICD-10-CM

## 2019-08-03 MED ORDER — GLIPIZIDE 5 MG PO TABS: 5.0000 mg | ORAL_TABLET | Freq: Two times a day (BID) | ORAL | 3 refills | Status: DC

## 2019-08-03 MED ORDER — PIOGLITAZONE HCL 15 MG PO TABS: 15.0000 mg | ORAL_TABLET | Freq: Every day | ORAL | 3 refills | Status: DC

## 2019-08-03 MED ORDER — SYNJARDY 5-1000 MG PO TABS: 1.0000 | ORAL_TABLET | Freq: Two times a day (BID) | ORAL | 3 refills | Status: DC

## 2019-08-03 MED ORDER — LISINOPRIL 2.5 MG PO TABS: 2.5000 mg | ORAL_TABLET | Freq: Every day | ORAL | 3 refills | Status: DC

## 2019-08-03 MED ORDER — SIMVASTATIN 20 MG PO TABS: 20.0000 mg | ORAL_TABLET | Freq: Every day | ORAL | 3 refills | Status: DC

## 2019-08-03 NOTE — Assessment & Plan Note (Addendum)
Chronic, well controlled.  Continue current regimen, tolerating well.

## 2019-08-03 NOTE — Assessment & Plan Note (Signed)
Chronic, stable on simvastatin 20mg  daily. The 10-year ASCVD risk score Mikey Bussing DC Brooke Bonito., et al., 2013) is: 28.2%   Values used to calculate the score:     Age: 69 years     Sex: Male     Is Non-Hispanic African American: No     Diabetic: Yes     Tobacco smoker: No     Systolic Blood Pressure: 373 mmHg     Is BP treated: Yes     HDL Cholesterol: 41.2 mg/dL     Total Cholesterol: 137 mg/dL

## 2019-08-03 NOTE — Assessment & Plan Note (Signed)
Advanced directive discussion - has at home. HCPOA would be wife. Will check at home and bring us copy.  

## 2019-08-03 NOTE — Assessment & Plan Note (Signed)
Mild.  Discussed avoiding bladder irritants, let us know if ongoing or worsening.

## 2019-08-03 NOTE — Patient Instructions (Signed)
You are doing well today Bring [name] copy of your advanced directives.  Return as needed or in 6 months for diabetes follow up visit.   Health Maintenance After Age 70 After age 63, you are at a higher risk for certain long-term diseases and infections as well as injuries from falls. Falls are a major cause of broken bones and head injuries in people who are older than age 60. Getting regular preventive care can help to keep you healthy and well. Preventive care includes getting regular testing and making lifestyle changes as recommended by your health care provider. Talk with your health care provider about:  Which screenings and tests you should have. A screening is a test that checks for a disease when you have no symptoms.  A diet and exercise plan that is right for you. What should I know about screenings and tests to prevent falls? Screening and testing are the best ways to find a health problem early. Early diagnosis and treatment give you the best chance of managing medical conditions that are common after age 69. Certain conditions and lifestyle choices may make you more likely to have a fall. Your health care provider may recommend:  Regular vision checks. Poor vision and conditions such as cataracts can make you more likely to have a fall. If you wear glasses, make sure to get your prescription updated if your vision changes.  Medicine review. Work with your health care provider to regularly review all of the medicines you are taking, including over-the-counter medicines. Ask your health care provider about any side effects that may make you more likely to have a fall. Tell your health care provider if any medicines that you take make you feel dizzy or sleepy.  Osteoporosis screening. Osteoporosis is a condition that causes the bones to get weaker. This can make the bones weak and cause them to break more easily.  Blood pressure screening. Blood pressure changes and medicines to control  blood pressure can make you feel dizzy.  Strength and balance checks. Your health care provider may recommend certain tests to check your strength and balance while standing, walking, or changing positions.  Foot health exam. Foot pain and numbness, as well as not wearing proper footwear, can make you more likely to have a fall.  Depression screening. You may be more likely to have a fall if you have a fear of falling, feel emotionally low, or feel unable to do activities that you used to do.  Alcohol use screening. Using too much alcohol can affect your balance and may make you more likely to have a fall. What actions can I take to lower my risk of falls? General instructions  Talk with your health care provider about your risks for falling. Tell your health care provider if: ? You fall. Be sure to tell your health care provider about all falls, even ones that seem minor. ? You feel dizzy, sleepy, or off-balance.  Take over-the-counter and prescription medicines only as told by your health care provider. These include any supplements.  Eat a healthy diet and maintain a healthy weight. A healthy diet includes low-fat dairy products, low-fat (lean) meats, and fiber from whole grains, beans, and lots of fruits and vegetables. Home safety  Remove any tripping hazards, such as rugs, cords, and clutter.  Install safety equipment such as grab bars in bathrooms and safety rails on stairs.  Keep rooms and walkways well-lit. Activity   Follow a regular exercise program to stay fit.  This will help you maintain your balance. Ask your health care provider what types of exercise are appropriate for you.  If you need a cane or walker, use it as recommended by your health care provider.  Wear supportive shoes that have nonskid soles. Lifestyle  Do not drink alcohol if your health care provider tells you not to drink.  If you drink alcohol, limit how much you have: ? 0-1 drink a day for  women. ? 0-2 drinks a day for men.  Be aware of how much alcohol is in your drink. In the [place] one drink equals one typical bottle of beer (12 oz), one-half glass of wine (5 oz), or one shot of hard liquor (1 oz).  Do not use any products that contain nicotine or tobacco, such as cigarettes and e-cigarettes. If you need help quitting, ask your health care provider. Summary  Having a healthy lifestyle and getting preventive care can help to protect your health and wellness after age 15.  Screening and testing are the best way to find a health problem early and help you avoid having a fall. Early diagnosis and treatment give you the best chance for managing medical conditions that are more common for people who are older than age 52.  Falls are a major cause of broken bones and head injuries in people who are older than age 71. Take precautions to prevent a fall at home.  Work with your health care provider to learn what changes you can make to improve your health and wellness and to prevent falls. This information is not intended to replace advice given to you by your health care provider. Make sure you discuss any questions you have with your health care provider. Document Revised: 04/28/2018 Document Reviewed: 11/18/2016 Elsevier Patient Education  2020 [employer].

## 2019-08-03 NOTE — Progress Notes (Signed)
This visit was conducted in person.  BP 120/64 (BP Location: Left Arm, Patient Position: Sitting, Cuff Size: Normal)   Pulse 84   Temp 97.6 F (36.4 C) (Temporal)   Ht 5' 7.5" (1.715 m)   Wt 149 lb 3 oz (67.7 kg)   SpO2 96%   BMI 23.02 kg/m    CC: AMW/CPE Subjective:    Patient ID: Eduardo Pierce, male    DOB: 1950/05/17, 69 y.o.   MRN: [id number]  HPI: Eduardo Pierce is a 69 y.o. male presenting on 08/03/2019 for Medicare Wellness   Did not speak with health advisor this year.    Hearing Screening   125Hz  250Hz  500Hz  1000Hz  2000Hz  3000Hz  4000Hz  6000Hz  8000Hz   Right ear:   20 25 25   40    Left ear:   25 40 25  40    Vision Screening Comments: Last eye exam, 01/2019.    Office Visit from 08/03/2019 in [place] at [facility]  PHQ-2 Total Score 0      Fall Risk  08/03/2019 07/11/2018 06/17/2017 05/20/2016 05/09/2015  Falls in the past year? 0 0 No No No    Preventative: Colon screening - 06/08/2011. Prolapse type polyp, rpt due 10 yrs Ardis Hughs).  Prostate - yearly PSA check. Nocturia x1, strong stream.  Lung cancer screening - quit smoking 1988  Influenza - 01/2019 COVID vaccine - completed [place] 03/2019 Prevnar 06/2017. Pneumovax 06/2018 Td - 2006  zostavax 09/2012 shingrix - 08/2018, 11/2018  Advanced directive discussion - has at home.HCPOA would be wife.Will check at home and bring [name] copy.  Seat belt use discussed  Sunscreen usediscussed. No changing moles on skin.Sees derm yearly - planned treatments for R cheek. Ex smoker. Quit 1988. Wife smokes at home. 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  Sugar free diet     Relevant past medical, surgical, family and social history reviewed and updated as indicated. Interim medical history since our last visit reviewed. Allergies and medications reviewed and updated. Outpatient Medications Prior to Visit  Medication Sig Dispense Refill  . aspirin EC 81 MG tablet Take 1 tablet (81 mg total) by mouth every other day.    . Cyanocobalamin (VITAMIN B-12 CR) 1000 MCG TBCR Take 1 tablet by mouth daily.     Marland Kitchen FREESTYLE LITE test strip TEST BLOOD SUGAR EVERY DAY AND AS NEEDED 300 each 3  . Lancets MISC Use to check blood sugar daily     . vitamin C (ASCORBIC ACID) 500 MG tablet Take 500 mg by mouth daily.      . Empagliflozin-metFORMIN HCl (SYNJARDY) 05-998 MG TABS Take 1 tablet by mouth 2 (two) times daily. 180 tablet 3  . glipiZIDE (GLUCOTROL) 5 MG tablet Take 1 tablet (5 mg total) by mouth 2 (two) times daily before a meal. 180 tablet 3  . lisinopril (ZESTRIL) 2.5 MG tablet Take 1 tablet (2.5 mg total) by mouth daily. 90 tablet 3  . pioglitazone (ACTOS) 15 MG tablet Take 1 tablet (15 mg total) by mouth daily. 90 tablet 3  . simvastatin (ZOCOR) 40 MG tablet Take 0.5 tablets (  20 mg total) by mouth at bedtime. 45 tablet 3   No facility-administered medications prior to visit.     Per HPI unless specifically indicated in ROS section below Review of Systems  Constitutional: Negative for activity change, appetite change, chills, fatigue, fever and unexpected weight change.  HENT: Negative for hearing loss.   Eyes: Negative for visual disturbance.  Respiratory: Negative for cough, chest tightness, shortness of breath and wheezing.   Cardiovascular: Negative for chest pain, palpitations and leg swelling.  Gastrointestinal: Negative for abdominal distention, abdominal pain, blood in stool, constipation, diarrhea, nausea and vomiting.  Genitourinary: Negative for difficulty urinating and hematuria.  Musculoskeletal: Negative for arthralgias, myalgias  and neck pain.  Skin: Negative for rash.  Neurological: Negative for dizziness, seizures, syncope and headaches.  Hematological: Negative for adenopathy. Does not bruise/bleed easily.  Psychiatric/Behavioral: Negative for dysphoric mood. The patient is not nervous/anxious.    Objective:  BP 120/64 (BP Location: Left Arm, Patient Position: Sitting, Cuff Size: Normal)   Pulse 84   Temp 97.6 F (36.4 C) (Temporal)   Ht 5' 7.5" (1.715 m)   Wt 149 lb 3 oz (67.7 kg)   SpO2 96%   BMI 23.02 kg/m   Wt Readings from Last 3 Encounters:  08/03/19 149 lb 3 oz (67.7 kg)  01/24/19 158 lb 14.4 oz (72.1 kg)  07/19/18 155 lb 7 oz (70.5 kg)      Physical Exam Vitals and nursing note reviewed.  Constitutional:      General: He is not in acute distress.    Appearance: Normal appearance. He is well-developed. He is not ill-appearing.  HENT:     Head: Normocephalic and atraumatic.     Right Ear: Hearing, tympanic membrane, ear canal and external ear normal.     Left Ear: Hearing, tympanic membrane, ear canal and external ear normal.  Eyes:     General: No scleral icterus.    Extraocular Movements: Extraocular movements intact.     Conjunctiva/sclera: Conjunctivae normal.     Pupils: Pupils are equal, round, and reactive to light.  Neck:     Thyroid: No thyroid mass, thyromegaly or thyroid tenderness.     Vascular: No carotid bruit.  Cardiovascular:     Rate and Rhythm: Normal rate and regular rhythm.     Pulses: Normal pulses.          Radial pulses are 2+ on the right side and 2+ on the left side.     Heart sounds: Normal heart sounds. No murmur heard.   Pulmonary:     Effort: Pulmonary effort is normal. No respiratory distress.     Breath sounds: Normal breath sounds. No wheezing, rhonchi or rales.  Abdominal:     General: Abdomen is flat. Bowel sounds are normal. There is no distension.     Palpations: Abdomen is soft. There is no mass.     Tenderness: There is no abdominal tenderness.  There is no guarding or rebound.     Hernia: No hernia is present.  Musculoskeletal:        General: Normal range of motion.     Cervical back: Normal range of motion and neck supple.     Right lower leg: No edema.     Left lower leg: No edema.     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  Skin:    General: Skin is warm and dry.  Findings: No rash.  Neurological:     General: No focal deficit present.     Mental Status: He is alert and oriented to person, place, and time.     Comments:  CN grossly intact, station and gait intact Recall 3/3 Calculation 4/5 serial 7s  Psychiatric:        Mood and Affect: Mood normal.        Behavior: Behavior normal.        Thought Content: Thought content normal.        Judgment: Judgment normal.       Results for orders placed or performed in visit on 07/27/19  CBC with Differential/Platelet  Result Value Ref Range   WBC 3.9 (L) 4.0 - 10.5 K/uL   RBC 4.43 4.22 - 5.81 Mil/uL   Hemoglobin 14.7 13.0 - 17.0 g/dL   HCT 43.0 39 - 52 %   MCV 97.1 78.0 - 100.0 fl   MCHC 34.1 30.0 - 36.0 g/dL   RDW 13.5 11.5 - 15.5 %   Platelets 177.0 150 - 400 K/uL   Neutrophils Relative % 60.3 43 - 77 %   Lymphocytes Relative 22.7 12 - 46 %   Monocytes Relative 10.6 3 - 12 %   Eosinophils Relative 5.0 0 - 5 %   Basophils Relative 1.4 0 - 3 %   Neutro Abs 2.3 1.4 - 7.7 K/uL   Lymphs Abs 0.9 0.7 - 4.0 K/uL   Monocytes Absolute 0.4 0 - 1 K/uL   Eosinophils Absolute 0.2 0 - 0 K/uL   Basophils Absolute 0.1 0 - 0 K/uL  PSA  Result Value Ref Range   PSA 0.19 0.10 - 4.00 ng/mL  Hemoglobin A1c  Result Value Ref Range   Hgb A1c MFr Bld 6.9 (H) 4.6 - 6.5 %  Comprehensive metabolic panel  Result Value Ref Range   Sodium 140 135 - 145 mEq/L   Potassium 4.0 3.5 - 5.1 mEq/L   Chloride 105 96 - 112 mEq/L   CO2 27 19 - 32 mEq/L   Glucose, Bld 121 (H) 70 - 99 mg/dL   BUN 20 6 - 23 mg/dL    Creatinine, Ser 1.20 0.40 - 1.50 mg/dL   Total Bilirubin 0.7 0.2 - 1.2 mg/dL   Alkaline Phosphatase 47 39 - 117 U/L   AST 14 0 - 37 U/L   ALT 13 0 - 53 U/L   Total Protein 6.4 6.0 - 8.3 g/dL   Albumin 4.6 3.5 - 5.2 g/dL   GFR 59.93 (L) >60.00 mL/min   Calcium 9.1 8.4 - 10.5 mg/dL  Lipid panel  Result Value Ref Range   Cholesterol 137 0 - 200 mg/dL   Triglycerides 120.0 0 - 149 mg/dL   HDL 41.20 >39.00 mg/dL   VLDL 24.0 0.0 - 40.0 mg/dL   LDL Cholesterol 72 0 - 99 mg/dL   Total CHOL/HDL Ratio 3    NonHDL 96.11    Assessment & Plan:  This visit occurred during the SARS-CoV-2 public health emergency.  Safety protocols were in place, including screening questions prior to the visit, additional usage of staff PPE, and extensive cleaning of exam room while observing appropriate contact time as indicated for disinfecting solutions.   Problem List Items Addressed This Visit    Urinary urgency    Mild.  Discussed avoiding bladder irritants, let us know if ongoing or worsening.       Medicare annual wellness visit, subsequent - Primary    I  have personally reviewed the Medicare Annual Wellness questionnaire and have noted 1. The patient's medical and social history 2. Their use of alcohol, tobacco or illicit drugs 3. Their current medications and supplements 4. The patient's functional ability including ADL's, fall risks, home safety risks and hearing or visual impairment. Cognitive function has been assessed and addressed as indicated.  5. Diet and physical activity 6. Evidence for depression or mood disorders The patients weight, height, BMI have been recorded in the chart. I have made referrals, counseling and provided education to the patient based on review of the above and I have provided the pt with a written personalized care plan for preventive services. Provider list updated.. See scanned questionairre as needed for further documentation. Reviewed preventative protocols and  updated unless pt declined.       Leukopenia    Mild, stable without recurrent infections.       Hyperlipidemia associated with type 2 diabetes mellitus (HCC)    Chronic, stable on simvastatin 20mg  daily. The 10-year ASCVD risk score Mikey Bussing DC Brooke Bonito., et al., 2013) is: 28.2%   Values used to calculate the score:     Age: 32 years     Sex: Male     Is Non-Hispanic African American: No     Diabetic: Yes     Tobacco smoker: No     Systolic Blood Pressure: 833 mmHg     Is BP treated: Yes     HDL Cholesterol: 41.2 mg/dL     Total Cholesterol: 137 mg/dL       Relevant Medications   Empagliflozin-metFORMIN HCl (SYNJARDY) 05-998 MG TABS   glipiZIDE (GLUCOTROL) 5 MG tablet   lisinopril (ZESTRIL) 2.5 MG tablet   pioglitazone (ACTOS) 15 MG tablet   simvastatin (ZOCOR) 20 MG tablet   Health maintenance examination    Preventative protocols reviewed and updated unless pt declined. Discussed healthy diet and lifestyle.       Diabetes mellitus type 2, controlled, without complications (HCC)    Chronic, well controlled.  Continue current regimen, tolerating well.       Relevant Medications   Empagliflozin-metFORMIN HCl (SYNJARDY) 05-998 MG TABS   glipiZIDE (GLUCOTROL) 5 MG tablet   lisinopril (ZESTRIL) 2.5 MG tablet   pioglitazone (ACTOS) 15 MG tablet   simvastatin (ZOCOR) 20 MG tablet   Advanced care planning/counseling discussion    Advanced directive discussion - has at home.HCPOA would be wife.Will check at home and bring [name] copy.           Meds ordered this encounter  Medications  . Empagliflozin-metFORMIN HCl (SYNJARDY) 05-998 MG TABS    Sig: Take 1 tablet by mouth 2 (two) times daily.    Dispense:  180 tablet    Refill:  3  . glipiZIDE (GLUCOTROL) 5 MG tablet    Sig: Take 1 tablet (5 mg total) by mouth 2 (two) times daily before a meal.    Dispense:  180 tablet    Refill:  3  . lisinopril (ZESTRIL) 2.5 MG tablet    Sig: Take 1 tablet (2.5 mg total) by mouth daily.      Dispense:  90 tablet    Refill:  3  . pioglitazone (ACTOS) 15 MG tablet    Sig: Take 1 tablet (15 mg total) by mouth daily.    Dispense:  90 tablet    Refill:  3  . simvastatin (ZOCOR) 20 MG tablet    Sig: Take 1 tablet (20 mg total) by mouth at  bedtime.    Dispense:  90 tablet    Refill:  3   No orders of the defined types were placed in this encounter.   Patient instructions: You are doing well today Bring [name] copy of your advanced directives.  Return as needed or in 6 months for diabetes follow up visit.   Follow up plan: Return in about 6 months (around 02/03/2020) for follow up visit.  Ria Bush, MD

## 2019-08-03 NOTE — Assessment & Plan Note (Signed)

## 2019-08-03 NOTE — Assessment & Plan Note (Signed)
Preventative protocols reviewed and updated unless pt declined. Discussed healthy diet and lifestyle.  

## 2019-08-03 NOTE — Assessment & Plan Note (Signed)
Mild, stable without recurrent infections.

## 2019-08-05 ENCOUNTER — Other Ambulatory Visit: Payer: Self-pay | Admitting: Family Medicine

## 2019-08-05 DIAGNOSIS — E785 Hyperlipidemia, unspecified: Secondary | ICD-10-CM

## 2019-09-18 DIAGNOSIS — S0512XA Contusion of eyeball and orbital tissues, left eye, initial encounter: Secondary | ICD-10-CM | POA: Diagnosis not present

## 2019-09-18 DIAGNOSIS — H1132 Conjunctival hemorrhage, left eye: Secondary | ICD-10-CM | POA: Diagnosis not present

## 2020-01-15 ENCOUNTER — Other Ambulatory Visit: Payer: Self-pay | Admitting: Family Medicine

## 2020-02-21 ENCOUNTER — Other Ambulatory Visit: Payer: Self-pay

## 2020-02-21 ENCOUNTER — Encounter: Payer: Self-pay | Admitting: Family Medicine

## 2020-02-21 ENCOUNTER — Ambulatory Visit (INDEPENDENT_AMBULATORY_CARE_PROVIDER_SITE_OTHER): Payer: Medicare HMO | Admitting: Family Medicine

## 2020-02-21 VITALS — BP 122/68 | HR 81 | Temp 97.9°F | Ht 67.5 in | Wt 152.1 lb

## 2020-02-21 DIAGNOSIS — E119 Type 2 diabetes mellitus without complications: Secondary | ICD-10-CM | POA: Diagnosis not present

## 2020-02-21 DIAGNOSIS — Z800 Family history of malignant neoplasm of digestive organs: Secondary | ICD-10-CM | POA: Insufficient documentation

## 2020-02-21 LAB — POC URINALSYSI DIPSTICK (AUTOMATED)
Bilirubin, UA: NEGATIVE
Blood, UA: NEGATIVE
Glucose, UA: POSITIVE — AB
Leukocytes, UA: NEGATIVE
Nitrite, UA: NEGATIVE
Protein, UA: NEGATIVE
Spec Grav, UA: 1.025 (ref 1.010–1.025)
Urobilinogen, UA: 0.2 E.U./dL
pH, UA: 5 (ref 5.0–8.0)

## 2020-02-21 LAB — POCT GLYCOSYLATED HEMOGLOBIN (HGB A1C): Hemoglobin A1C: 7 % — AB (ref 4.0–5.6)

## 2020-02-21 NOTE — Assessment & Plan Note (Addendum)
Chronic, overall stable. Continue current regimen.  Foot exam today.  Remotely completed DSME.  On actos - check UA.

## 2020-02-21 NOTE — Assessment & Plan Note (Signed)
Newly diagnosed in brother.  Will refer back for earlier colonoscopy.

## 2020-02-21 NOTE — Patient Instructions (Addendum)
A1c was 7% today - continue current regimen and renew efforts at diabetic diet and regular walking routine.  Urinalysis today.  Have eye doctor send [name] report (fax [phone]) You are doing well today. Return as needed or in 6 months for physical.

## 2020-02-21 NOTE — Progress Notes (Signed)
Patient ID: Eduardo Pierce, male    DOB: 03-Jun-1950, 70 y.o.   MRN: [id number]  This visit was conducted in person.  BP 122/68 (BP Location: Left Arm, Patient Position: Sitting, Cuff Size: Normal)   Pulse 81   Temp 97.9 F (36.6 C) (Temporal)   Ht 5' 7.5" (1.715 m)   Wt 152 lb 2 oz (69 kg)   SpO2 94%   BMI 23.47 kg/m    CC: 6 mo DM f/u visit  Subjective:   HPI: Eduardo Pierce is a 70 y.o. male presenting on 02/21/2020 for Diabetes (Here for 6 mo f/u. )   New fmhx colon cancer - brother age 53. Will re-refer to GI.  DM - does regularly check sugars twice daily - fasting 100-140. Compliant with antihyperglycemic regimen which includes: synjardy (empagliflozin, metformin 05/998) bid, glipizide 5mg  bid, actos 15mg  daily. Denies low sugars or hypoglycemic symptoms. Denies paresthesias. Last diabetic eye exam 01/2019 - upcoming appt. Pneumovax: 06/2018. Prevnar: 06/2017. Glucometer brand: freestyle. DSME: remotely 1997. [facility].  Lab Results  Component Value Date   HGBA1C 7.0 (A) 02/21/2020   Diabetic Foot Exam - Simple   Simple Foot Form Diabetic Foot exam was performed with the following findings: Yes 02/21/2020  9:41 AM  Visual Inspection No deformities, no ulcerations, no other skin breakdown bilaterally: Yes Sensation Testing Intact to touch and monofilament testing bilaterally: Yes Pulse Check Posterior Tibialis and Dorsalis pulse intact bilaterally: Yes Comments Some maceration between 4th/5th digits on right    Lab Results  Component Value Date   MICROALBUR <0.7 09/17/2015         Relevant past medical, surgical, family and social history reviewed and updated as indicated. Interim medical history since our last visit reviewed. Allergies and medications reviewed and updated. Outpatient Medications Prior to Visit  Medication Sig Dispense Refill  . aspirin EC 81 MG tablet Take 1 tablet (81 mg total) by mouth every other day.    . Cyanocobalamin  (VITAMIN B-12 CR) 1000 MCG TBCR Take 1 tablet by mouth daily.    Marland Kitchen FREESTYLE LITE test strip TEST BLOOD SUGAR EVERY DAY AND AS NEEDED 300 each 3  . glipiZIDE (GLUCOTROL) 5 MG tablet Take 1 tablet (5 mg total) by mouth 2 (two) times daily before a meal. 180 tablet 3  . Lancets MISC Use to check blood sugar daily    . lisinopril (ZESTRIL) 2.5 MG tablet Take 1 tablet (2.5 mg total) by mouth daily. 90 tablet 3  . pioglitazone (ACTOS) 15 MG tablet Take 1 tablet (15 mg total) by mouth daily. 90 tablet 3  . simvastatin (ZOCOR) 20 MG tablet Take 1 tablet (20 mg total) by mouth at bedtime. 90 tablet 3  . SYNJARDY 05-998 MG TABS TAKE 1 TABLET BY MOUTH TWICE A DAY 60 tablet 2  . vitamin C (ASCORBIC ACID) 500 MG tablet Take 500 mg by mouth daily.     No facility-administered medications prior to visit.     Per HPI unless specifically indicated in ROS section below Review of Systems Objective:  BP 122/68 (BP Location: Left Arm, Patient Position: Sitting, Cuff Size: Normal)   Pulse 81   Temp 97.9 F (36.6 C) (Temporal)   Ht 5' 7.5" (1.715 m)   Wt 152 lb 2 oz (69 kg)   SpO2 94%   BMI 23.47 kg/m   Wt Readings from Last 3 Encounters:  02/21/20 152 lb 2 oz (69 kg)  08/03/19 149  lb 3 oz (67.7 kg)  01/24/19 158 lb 14.4 oz (72.1 kg)      Physical Exam Vitals and nursing note reviewed.  Constitutional:      General: He is not in acute distress.    Appearance: Normal appearance. He is well-developed and well-nourished. He is not ill-appearing.  HENT:     Head: Normocephalic and atraumatic.     Right Ear: External ear normal.     Left Ear: External ear normal.     Mouth/Throat:     Mouth: Oropharynx is clear and moist.  Eyes:     General: No scleral icterus.    Extraocular Movements: Extraocular movements intact and EOM normal.     Conjunctiva/sclera: Conjunctivae normal.     Pupils: Pupils are equal, round, and reactive to light.  Cardiovascular:     Rate and Rhythm: Normal rate and regular  rhythm.     Pulses: Normal pulses and intact distal pulses.     Heart sounds: Normal heart sounds. No murmur heard.   Pulmonary:     Effort: Pulmonary effort is normal. No respiratory distress.     Breath sounds: Normal breath sounds. No wheezing, rhonchi or rales.  Musculoskeletal:        General: No edema. Normal range of motion.     Cervical back: Normal range of motion and neck supple.     Right lower leg: No edema.     Left lower leg: No edema.     Comments: See HPI for foot exam if done  Lymphadenopathy:     Cervical: No cervical adenopathy.  Skin:    General: Skin is warm and dry.     Findings: No rash.  Neurological:     Mental Status: He is alert.  Psychiatric:        Mood and Affect: Mood and affect normal.       Results for orders placed or performed in visit on 02/21/20  POCT glycosylated hemoglobin (Hb A1C)  Result Value Ref Range   Hemoglobin A1C 7.0 (A) 4.0 - 5.6 %   HbA1c POC (<> result, manual entry)     HbA1c, POC (prediabetic range)     HbA1c, POC (controlled diabetic range)    POCT Urinalysis Dipstick (Automated)  Result Value Ref Range   Color, UA yellow    Clarity, UA clear    Glucose, UA Positive (A) Negative   Bilirubin, UA negative    Ketones, UA +/-    Spec Grav, UA 1.025 1.010 - 1.025   Blood, UA negative    pH, UA 5.0 5.0 - 8.0   Protein, UA Negative Negative   Urobilinogen, UA 0.2 0.2 or 1.0 E.U./dL   Nitrite, UA negative    Leukocytes, UA Negative Negative   Assessment & Plan:  This visit occurred during the SARS-CoV-2 public health emergency.  Safety protocols were in place, including screening questions prior to the visit, additional usage of staff PPE, and extensive cleaning of exam room while observing appropriate contact time as indicated for disinfecting solutions.   Problem List Items Addressed This Visit    Family history of colon cancer    Newly diagnosed in brother.  Will refer back for earlier colonoscopy.        Relevant Orders   Ambulatory referral to Gastroenterology   Diabetes mellitus type 2, controlled, without complications ([place]) - Primary    Chronic, overall stable. Continue current regimen.  Foot exam today.  Remotely completed DSME.  On actos -  check UA.       Relevant Orders   POCT glycosylated hemoglobin (Hb A1C) (Completed)   POCT Urinalysis Dipstick (Automated) (Completed)       No orders of the defined types were placed in this encounter.  Orders Placed This Encounter  Procedures  . Ambulatory referral to Gastroenterology    Referral Priority:   Routine    Referral Type:   Consultation    Referral Reason:   Specialty Services Required    Number of Visits Requested:   1  . POCT glycosylated hemoglobin (Hb A1C)  . POCT Urinalysis Dipstick (Automated)   Patient Instructions  A1c was 7% today - continue current regimen and renew efforts at diabetic diet and regular walking routine.  Urinalysis today.  Have eye doctor send [name] report (fax [phone]) You are doing well today. Return as needed or in 6 months for physical.    Follow up plan: Return in about 6 months (around 08/20/2020) for annual exam, prior fasting for blood work, medicare wellness visit.  Ria Bush, MD

## 2020-06-13 DIAGNOSIS — Z20822 Contact with and (suspected) exposure to covid-19: Secondary | ICD-10-CM | POA: Diagnosis not present

## 2020-07-27 ENCOUNTER — Other Ambulatory Visit: Payer: Self-pay | Admitting: Family Medicine

## 2020-07-27 DIAGNOSIS — E785 Hyperlipidemia, unspecified: Secondary | ICD-10-CM

## 2020-07-27 DIAGNOSIS — E1169 Type 2 diabetes mellitus with other specified complication: Secondary | ICD-10-CM

## 2020-07-30 ENCOUNTER — Telehealth: Payer: Self-pay | Admitting: Family Medicine

## 2020-07-30 NOTE — Telephone Encounter (Signed)
LVM for pt to rtn my call to r/s appt with [facility] on 09/06/20

## 2020-08-14 ENCOUNTER — Telehealth: Payer: Self-pay | Admitting: Family Medicine

## 2020-08-14 NOTE — Telephone Encounter (Signed)
LVM for pt to rtn my cal to r/s appt with nha on 08/27/20

## 2020-08-19 ENCOUNTER — Telehealth: Payer: Self-pay | Admitting: Family Medicine

## 2020-08-19 NOTE — Telephone Encounter (Signed)
LVM for pt to rtn my call to r/s appt with [facility] on 08/27/20

## 2020-08-22 ENCOUNTER — Ambulatory Visit (INDEPENDENT_AMBULATORY_CARE_PROVIDER_SITE_OTHER): Payer: Medicare HMO

## 2020-08-22 DIAGNOSIS — Z0000 Encounter for general adult medical examination without abnormal findings: Secondary | ICD-10-CM

## 2020-08-22 NOTE — Patient Instructions (Signed)
Eduardo Pierce , Thank you for taking time to come for your Medicare Wellness Visit. I appreciate your ongoing commitment to your health goals. Please review the following plan we discussed and let me know if I can assist you in the future.   Screening recommendations/referrals: Colonoscopy: Up to date, completed 06/08/2011, due 05/2021 Recommended yearly ophthalmology/optometry visit for glaucoma screening and checkup Recommended yearly dental visit for hygiene and checkup  Vaccinations: Influenza vaccine: due Fall 2022 Pneumococcal vaccine: Completed series Tdap vaccine: decline-insurance Shingles vaccine: Completed series   Covid-19: completed 3 vaccines   Advanced directives: Please bring a copy of your POA (Power of Attorney) and/or Living Will to your next appointment.   Conditions/risks identified: diabetes, hyperlipidemia   Next appointment: Follow up in one year for your annual wellness visit.   Preventive Care 71 Years and Older, Male Preventive care refers to lifestyle choices and visits with your health care provider that can promote health and wellness. What does preventive care include? A yearly physical exam. This is also called an annual well check. Dental exams once or twice a year. Routine eye exams. Ask your health care provider how often you should have your eyes checked. Personal lifestyle choices, including: Daily care of your teeth and gums. Regular physical activity. Eating a healthy diet. Avoiding tobacco and drug use. Limiting alcohol use. Practicing safe sex. Taking low doses of aspirin every day. Taking vitamin and mineral supplements as recommended by your health care provider. What happens during an annual well check? The services and screenings done by your health care provider during your annual well check will depend on your age, overall health, lifestyle risk factors, and family history of disease. Counseling  Your health care provider may ask you  questions about your: Alcohol use. Tobacco use. Drug use. Emotional well-being. Home and relationship well-being. Sexual activity. Eating habits. History of falls. Memory and ability to understand (cognition). Work and work Statistician. Screening  You may have the following tests or measurements: Height, weight, and BMI. Blood pressure. Lipid and cholesterol levels. These may be checked every 5 years, or more frequently if you are over 95 years old. Skin check. Lung cancer screening. You may have this screening every year starting at age 71 if you have a 30-pack-year history of smoking and currently smoke or have quit within the past 15 years. Fecal occult blood test (FOBT) of the stool. You may have this test every year starting at age 71. Flexible sigmoidoscopy or colonoscopy. You may have a sigmoidoscopy every 5 years or a colonoscopy every 10 years starting at age 109. Prostate cancer screening. Recommendations will vary depending on your family history and other risks. Hepatitis C blood test. Hepatitis B blood test. Sexually transmitted disease (STD) testing. Diabetes screening. This is done by checking your blood sugar (glucose) after you have not eaten for a while (fasting). You may have this done every 1-3 years. Abdominal aortic aneurysm (AAA) screening. You may need this if you are a current or former smoker. Osteoporosis. You may be screened starting at age 71 if you are at high risk. Talk with your health care provider about your test results, treatment options, and if necessary, the need for more tests. Vaccines  Your health care provider may recommend certain vaccines, such as: Influenza vaccine. This is recommended every year. Tetanus, diphtheria, and acellular pertussis (Tdap, Td) vaccine. You may need a Td booster every 10 years. Zoster vaccine. You may need this after age 3. Pneumococcal 13-valent conjugate (  PCV13) vaccine. One dose is recommended after age  71. Pneumococcal polysaccharide (PPSV23) vaccine. One dose is recommended after age 71. Talk to your health care provider about which screenings and vaccines you need and how often you need them. This information is not intended to replace advice given to you by your health care provider. Make sure you discuss any questions you have with your health care provider. Document Released: 02/01/2015 Document Revised: 09/25/2015 Document Reviewed: 11/06/2014 Elsevier Interactive Patient Education  2017 [employer] Prevention in the Home Falls can cause injuries. They can happen to people of all ages. There are many things you can do to make your home safe and to help prevent falls. What can I do on the outside of my home? Regularly fix the edges of walkways and driveways and fix any cracks. Remove anything that might make you trip as you walk through a door, such as a raised step or threshold. Trim any bushes or trees on the path to your home. Use bright outdoor lighting. Clear any walking paths of anything that might make someone trip, such as rocks or tools. Regularly check to see if handrails are loose or broken. Make sure that both sides of any steps have handrails. Any raised decks and porches should have guardrails on the edges. Have any leaves, snow, or ice cleared regularly. Use sand or salt on walking paths during winter. Clean up any spills in your garage right away. This includes oil or grease spills. What can I do in the bathroom? Use night lights. Install grab bars by the toilet and in the tub and shower. Do not use towel bars as grab bars. Use non-skid mats or decals in the tub or shower. If you need to sit down in the shower, use a plastic, non-slip stool. Keep the floor dry. Clean up any water that spills on the floor as soon as it happens. Remove soap buildup in the tub or shower regularly. Attach bath mats securely with double-sided non-slip rug tape. Do not have throw  rugs and other things on the floor that can make you trip. What can I do in the bedroom? Use night lights. Make sure that you have a light by your bed that is easy to reach. Do not use any sheets or blankets that are too big for your bed. They should not hang down onto the floor. Have a firm chair that has side arms. You can use this for support while you get dressed. Do not have throw rugs and other things on the floor that can make you trip. What can I do in the kitchen? Clean up any spills right away. Avoid walking on wet floors. Keep items that you use a lot in easy-to-reach places. If you need to reach something above you, use a strong step stool that has a grab bar. Keep electrical cords out of the way. Do not use floor polish or wax that makes floors slippery. If you must use wax, use non-skid floor wax. Do not have throw rugs and other things on the floor that can make you trip. What can I do with my stairs? Do not leave any items on the stairs. Make sure that there are handrails on both sides of the stairs and use them. Fix handrails that are broken or loose. Make sure that handrails are as long as the stairways. Check any carpeting to make sure that it is firmly attached to the stairs. Fix any carpet that is  loose or worn. Avoid having throw rugs at the top or bottom of the stairs. If you do have throw rugs, attach them to the floor with carpet tape. Make sure that you have a light switch at the top of the stairs and the bottom of the stairs. If you do not have them, ask someone to add them for you. What else can I do to help prevent falls? Wear shoes that: Do not have high heels. Have rubber bottoms. Are comfortable and fit you well. Are closed at the toe. Do not wear sandals. If you use a stepladder: Make sure that it is fully opened. Do not climb a closed stepladder. Make sure that both sides of the stepladder are locked into place. Ask someone to hold it for you, if  possible. Clearly mark and make sure that you can see: Any grab bars or handrails. First and last steps. Where the edge of each step is. Use tools that help you move around (mobility aids) if they are needed. These include: Canes. Walkers. Scooters. Crutches. Turn on the lights when you go into a dark area. Replace any light bulbs as soon as they burn out. Set up your furniture so you have a clear path. Avoid moving your furniture around. If any of your floors are uneven, fix them. If there are any pets around you, be aware of where they are. Review your medicines with your doctor. Some medicines can make you feel dizzy. This can increase your chance of falling. Ask your doctor what other things that you can do to help prevent falls. This information is not intended to replace advice given to you by your health care provider. Make sure you discuss any questions you have with your health care provider. Document Released: 11/01/2008 Document Revised: 06/13/2015 Document Reviewed: 02/09/2014 Elsevier Interactive Patient Education  2017 [employer].

## 2020-08-22 NOTE — Progress Notes (Signed)
PCP notes:  Health Maintenance: Tdap-insurance   Abnormal Screenings: none   Patient concerns: none   Nurse concerns: none   Next PCP appt.: 09/10/2020 @ 8:30 am

## 2020-08-22 NOTE — Progress Notes (Signed)
Subjective:   Eduardo Pierce is a 70 y.o. male who presents for Medicare Annual/Subsequent preventive examination.  Review of Systems: N/A      I connected with the patient today by telephone and verified that I am speaking with the correct person using two identifiers. Location patient: home Location nurse: work Persons participating in the telephone visit: patient, nurse.   I discussed the limitations, risks, security and privacy concerns of performing an evaluation and management service by telephone and the availability of in person appointments. I also discussed with the patient that there may be a patient responsible charge related to this service. The patient expressed understanding and verbally consented to this telephonic visit.        Cardiac Risk Factors include: advanced age (>26mn, >>64women);male gender;diabetes mellitus;Other (see comment), Risk factor comments: hyperlipidemia     Objective:    Today's Vitals   There is no height or weight on file to calculate BMI.  Advanced Directives 08/22/2020 07/11/2018 06/17/2017 03/13/2011  Does Patient Have a Medical Advance Directive? Yes Yes Yes Patient has advance directive, copy not in chart  Type of Advance Directive [place] will HFriendlyLiving will [place]in Chart? No - copy requested No - copy requested No - copy requested Copy requested from family  Pre-existing out of facility DNR order (yellow form or pink MOST form) - - - No    Current Medications (verified) Outpatient Encounter Medications as of 08/22/2020  Medication Sig   aspirin EC 81 MG tablet Take 1 tablet (81 mg total) by mouth every other day.   Cyanocobalamin (VITAMIN B-12 CR) 1000 MCG TBCR Take 1 tablet by mouth daily.   FREESTYLE LITE test strip TEST BLOOD SUGAR EVERY DAY AND AS NEEDED   glipiZIDE  (GLUCOTROL) 5 MG tablet Take 1 tablet (5 mg total) by mouth 2 (two) times daily before a meal.   Lancets MISC Use to check blood sugar daily   lisinopril (ZESTRIL) 2.5 MG tablet Take 1 tablet (2.5 mg total) by mouth daily.   pioglitazone (ACTOS) 15 MG tablet Take 1 tablet (15 mg total) by mouth daily.   simvastatin (ZOCOR) 20 MG tablet TAKE 1 TABLET BY MOUTH EVERYDAY AT BEDTIME   SYNJARDY 05-998 MG TABS TAKE 1 TABLET BY MOUTH TWICE A DAY   vitamin C (ASCORBIC ACID) 500 MG tablet Take 500 mg by mouth daily.   No facility-administered encounter medications on file as of 08/22/2020.    Allergies (verified) Penicillins   History: Past Medical History:  Diagnosis Date   Diabetes mellitus type II 10/20/1995   completed DSME   Hyperlipidemia 02/20/1996   Leukopenia 03/2011   mild, normal periph smear, normal B12 and folate   Past Surgical History:  Procedure Laterality Date   COLONOSCOPY  05/2011   prolapse type polyp, rpt due 10 yrs   hospitalization  02/2011   vasovagal presyncope, some NSVT on tele   nuclear stress echo  02/2011   no ischemia/infarct, normal LV wall motion, EF 68%   Family History  Problem Relation Age of Onset   Hypertension Mother    Diabetes Mother    Stroke Mother    Kidney disease Mother        failure-dialysis   Heart disease Mother        MI   Heart disease Other  CHF, CABG   Cancer Other        lung   Colon cancer Neg Hx    Social History   Socioeconomic History   Marital status: Married    Spouse name: Not on file   Number of children: 3   Years of education: Not on file   Highest education level: Not on file  Occupational History   Occupation: OPNS MGR Eduardo Pierce. [place]    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  Sugar free diet.   Social Determinants of Health   Financial Resource Strain: Low Risk    Difficulty of Paying Living Expenses: Not hard at all  Food Insecurity: No Food Insecurity   Worried About Charity fundraiser in the Last Year: Never true   Eduardo Pierce in the Last Year: Never true  Transportation Needs: No Transportation Needs   Lack of Transportation (Medical): No   Lack of Transportation (Non-Medical): No  Physical Activity: Insufficiently Active   Days of Exercise per Week: 3 days   Minutes of Exercise per Session: 30 min  Stress: No Stress Concern Present   Feeling of Stress : Not at all  Social Connections: Not on file    Tobacco Counseling Counseling given: Not Answered   Clinical Intake:  Pre-visit preparation completed: Yes  Pain : No/denies pain     Nutritional Risks: None Diabetes: Yes CBG done?: No Did pt. bring in CBG monitor from home?: No  How often do you need to have someone help you when you read instructions, pamphlets, or other written materials from your doctor or pharmacy?: 1 - Never  Diabetic: Yes Nutrition Risk Assessment:  Has the patient had any N/V/D within the last 2 months?  No  Does the patient have any non-healing wounds?  No  Has the patient had any unintentional weight loss or weight gain?  No   Diabetes:  Is the patient diabetic?  Yes  If diabetic, was a CBG obtained today?  No  telephone visit  Did the patient bring in their glucometer from home?  No  telephone visit  How often do you monitor your CBG's? daily.   Financial Strains and Diabetes  Management:  Are you having any financial strains with the device, your supplies or your medication? No .  Does the patient want to be seen by Chronic Care Management for management of their diabetes?  No  Would the patient like to be referred to a Nutritionist or for Diabetic Management?  No   Diabetic Exams:  Diabetic Eye Exam: Completed 02/20/2020 Diabetic Foot Exam: Completed 02/21/2020   Interpreter Needed?: No  Information entered by :: CJohnson, RN   Activities of Daily Living In  your present state of health, do you have any difficulty performing the following activities: 08/22/2020  Hearing? N  Vision? N  Difficulty concentrating or making decisions? N  Walking or climbing stairs? N  Dressing or bathing? N  Doing errands, shopping? N  Preparing Food and eating ? N  Using the Toilet? N  In the past six months, have you accidently leaked urine? N  Do you have problems with loss of bowel control? N  Managing your Medications? N  Managing your Finances? N  Housekeeping or managing your Housekeeping? N  Some recent data might be hidden    Patient Care Team: Ria Bush, MD as PCP - General (Family Medicine)  Indicate any recent Medical Services you may have received from other than [facility] providers in the past year (date may be approximate).     Assessment:   This is a routine wellness examination for Deanglo.  Hearing/Vision screen Vision Screening - Comments:: Patient gets annual eye exams   Dietary issues and exercise activities discussed: Current Exercise Habits: Structured exercise class, Type of exercise: strength training/weights, Time (Minutes): 30, Frequency (Times/Week): 3, Weekly Exercise (Minutes/Week): 90, Intensity: Moderate, Exercise limited by: None identified   Goals Addressed             This Visit's Progress    Patient Stated       08/22/2020, I will continue to exercise for 30 minutes 3 days per week.       Depression Screen PHQ 2/9  Scores 08/22/2020 08/03/2019 07/11/2018 06/17/2017 05/20/2016 05/09/2015  PHQ - 2 Score 0 0 0 0 0 0  PHQ- 9 Score 0 - 0 0 - -    Fall Risk Fall Risk  08/22/2020 08/03/2019 07/11/2018 06/17/2017 05/20/2016  Falls in the past year? 0 0 0 No No  Number falls in past yr: 0 - - - -  Injury with Fall? 0 - - - -  Risk for fall due to : Medication side effect - - - -  Follow up Falls evaluation completed;Falls prevention discussed - - - -    FALL RISK PREVENTION PERTAINING TO THE HOME:  Any stairs in or around the home? Yes  If so, are there any without handrails? No  Home free of loose throw rugs in walkways, pet beds, electrical cords, etc? Yes  Adequate lighting in your home to reduce risk of falls? Yes   ASSISTIVE DEVICES UTILIZED TO PREVENT FALLS:  Life alert? No  Use of a cane, walker or w/c? No  Grab bars in the bathroom? No  Shower chair or bench in shower? No  Elevated toilet seat or a handicapped toilet? No   TIMED UP AND GO:  Was the test performed?  N/A telephone visit .    Cognitive Function: MMSE - Mini Mental State Exam 08/22/2020 07/11/2018 06/17/2017  Not completed: Refused - -  Orientation to time - 5 5  Orientation to Place - 5 5  Registration - 3 3  Attention/ Calculation - 0 0  Recall - 3 3  Language- name 2 objects - 0 0  Language- repeat - 1 1  Language- follow 3 step command - 0 3  Language- read & follow direction - 0 0  Write a sentence - 0 0  Copy design - 0 0  Total score - 17 20  Mini Cog  Mini-Cog screen was not completed. Refused. Maximum score is 22. A value of 0 denotes this part of the MMSE was not completed  or the patient failed this part of the Mini-Cog screening.       Immunizations Immunization History  Administered Date(s) Administered   Influenza Whole 12/02/2004   Influenza, High Dose Seasonal PF 01/24/2019   PFIZER(Purple Top)SARS-COV-2 Vaccination 02/24/2019, 03/21/2019, 10/10/2019   Pneumococcal Conjugate-13 06/22/2017   Pneumococcal  Polysaccharide-23 07/19/2018   Td 11/19/2004   Zoster Recombinat (Shingrix) 09/13/2018, 12/12/2018   Zoster, Live 10/18/2012    TDAP status: Due, Education has been provided regarding the importance of this vaccine. Advised may receive this vaccine at local pharmacy or Health Dept. Aware to provide a copy of the vaccination record if obtained from local pharmacy or Health Dept. Verbalized acceptance and understanding.  Flu Vaccine status: due Fall 2022  Pneumococcal vaccine status: Up to date  Covid-19 vaccine status: Completed 3 vaccines  Qualifies for Shingles Vaccine? Yes   Zostavax completed Yes   Shingrix Completed?: Yes  Screening Tests Health Maintenance  Topic Date Due   COVID-19 Vaccine (4 - Booster for Pfizer series) 02/09/2020   INFLUENZA VACCINE  08/19/2020   HEMOGLOBIN A1C  08/20/2020   TETANUS/TDAP  08/23/2023 (Originally 11/20/2014)   OPHTHALMOLOGY EXAM  02/19/2021   FOOT EXAM  02/20/2021   COLONOSCOPY (Pts 45-75yr Insurance coverage will need to be confirmed)  06/07/2021   Hepatitis C Screening  Completed   PNA vac Low Risk Adult  Completed   Zoster Vaccines- Shingrix  Completed   HPV VACCINES  Aged Out    Health Maintenance  Health Maintenance Due  Topic Date Due   COVID-19 Vaccine (4 - Booster for [place]) 02/09/2020   INFLUENZA VACCINE  08/19/2020   HEMOGLOBIN A1C  08/20/2020    Colorectal cancer screening: Type of screening: Colonoscopy. Completed 06/08/2011. Repeat every 10 years  Lung Cancer Screening: (Low Dose CT Chest recommended if Age 71-80years, 30 pack-year currently smoking OR have quit w/in 15 years.) does not qualify.   Additional Screening:  Hepatitis C Screening: does qualify; Completed 04/25/2015  Vision Screening: Recommended annual ophthalmology exams for early detection of glaucoma and other disorders of the eye. Is the patient up to date with their annual eye exam?  Yes  Who is the provider or what is the name of the  office in which the patient attends annual eye exams? Dr. GKaty Fitch If pt is not established with a provider, would they like to be referred to a provider to establish care? No .   Dental Screening: Recommended annual dental exams for proper oral hygiene  Community Resource Referral / Chronic Care Management: CRR required this visit?  No   CCM required this visit?  No      Plan:     I have personally reviewed and noted the following in the patient's chart:   Medical and social history Use of alcohol, tobacco or illicit drugs  Current medications and supplements including opioid prescriptions. Patient is not currently taking opioid prescriptions. Functional ability and status Nutritional status Physical activity Advanced directives List of other physicians Hospitalizations, surgeries, and ER visits in previous 12 months Vitals Screenings to include cognitive, depression, and falls Referrals and appointments  In addition, I have reviewed and discussed with patient certain preventive protocols, quality metrics, and best practice recommendations. A written personalized care plan for preventive services as well as general preventive health recommendations were provided to patient.   Due to this being a telephonic visit, the after visit summary with patients personalized plan was offered to patient via office or my-chart.  Patient preferred to pick up at office at next visit or via mychart.   Andrez Grime, LPN   [id number]

## 2020-08-27 ENCOUNTER — Ambulatory Visit: Payer: Medicare HMO

## 2020-08-28 ENCOUNTER — Other Ambulatory Visit: Payer: Self-pay | Admitting: Family Medicine

## 2020-08-29 ENCOUNTER — Other Ambulatory Visit: Payer: Self-pay | Admitting: Family Medicine

## 2020-08-30 ENCOUNTER — Other Ambulatory Visit: Payer: Self-pay | Admitting: Family Medicine

## 2020-09-01 ENCOUNTER — Other Ambulatory Visit: Payer: Self-pay | Admitting: Family Medicine

## 2020-09-01 DIAGNOSIS — D72819 Decreased white blood cell count, unspecified: Secondary | ICD-10-CM

## 2020-09-01 DIAGNOSIS — E785 Hyperlipidemia, unspecified: Secondary | ICD-10-CM

## 2020-09-01 DIAGNOSIS — E119 Type 2 diabetes mellitus without complications: Secondary | ICD-10-CM

## 2020-09-01 DIAGNOSIS — E1169 Type 2 diabetes mellitus with other specified complication: Secondary | ICD-10-CM

## 2020-09-01 DIAGNOSIS — Z125 Encounter for screening for malignant neoplasm of prostate: Secondary | ICD-10-CM

## 2020-09-03 ENCOUNTER — Other Ambulatory Visit (INDEPENDENT_AMBULATORY_CARE_PROVIDER_SITE_OTHER): Payer: Medicare HMO

## 2020-09-03 ENCOUNTER — Other Ambulatory Visit: Payer: Self-pay

## 2020-09-03 DIAGNOSIS — E119 Type 2 diabetes mellitus without complications: Secondary | ICD-10-CM | POA: Diagnosis not present

## 2020-09-03 DIAGNOSIS — D72819 Decreased white blood cell count, unspecified: Secondary | ICD-10-CM

## 2020-09-03 DIAGNOSIS — E1169 Type 2 diabetes mellitus with other specified complication: Secondary | ICD-10-CM | POA: Diagnosis not present

## 2020-09-03 DIAGNOSIS — E785 Hyperlipidemia, unspecified: Secondary | ICD-10-CM

## 2020-09-03 DIAGNOSIS — Z125 Encounter for screening for malignant neoplasm of prostate: Secondary | ICD-10-CM

## 2020-09-03 LAB — CBC WITH DIFFERENTIAL/PLATELET
Basophils Absolute: 0 10*3/uL (ref 0.0–0.1)
Basophils Relative: 1.1 % (ref 0.0–3.0)
Eosinophils Absolute: 0.2 10*3/uL (ref 0.0–0.7)
Eosinophils Relative: 4.9 % (ref 0.0–5.0)
HCT: 43.4 % (ref 39.0–52.0)
Hemoglobin: 14.5 g/dL (ref 13.0–17.0)
Lymphocytes Relative: 20.8 % (ref 12.0–46.0)
Lymphs Abs: 0.7 10*3/uL (ref 0.7–4.0)
MCHC: 33.5 g/dL (ref 30.0–36.0)
MCV: 96.9 fl (ref 78.0–100.0)
Monocytes Absolute: 0.4 10*3/uL (ref 0.1–1.0)
Monocytes Relative: 12.4 % — ABNORMAL HIGH (ref 3.0–12.0)
Neutro Abs: 2 10*3/uL (ref 1.4–7.7)
Neutrophils Relative %: 60.8 % (ref 43.0–77.0)
Platelets: 163 10*3/uL (ref 150.0–400.0)
RBC: 4.47 Mil/uL (ref 4.22–5.81)
RDW: 13.9 % (ref 11.5–15.5)
WBC: 3.4 10*3/uL — ABNORMAL LOW (ref 4.0–10.5)

## 2020-09-03 LAB — LIPID PANEL
Cholesterol: 137 mg/dL (ref 0–200)
HDL: 48.2 mg/dL (ref >39.00)
LDL Cholesterol: 72 mg/dL (ref 0–99)
NonHDL: 89.08
Total CHOL/HDL Ratio: 3
Triglycerides: 86 mg/dL (ref 0.0–149.0)
VLDL: 17.2 mg/dL (ref 0.0–40.0)

## 2020-09-03 LAB — COMPREHENSIVE METABOLIC PANEL
ALT: 13 U/L (ref 0–53)
AST: 16 U/L (ref 0–37)
Albumin: 4.5 g/dL (ref 3.5–5.2)
Alkaline Phosphatase: 46 U/L (ref 39–117)
BUN: 20 mg/dL (ref 6–23)
CO2: 26 mEq/L (ref 19–32)
Calcium: 9.4 mg/dL (ref 8.4–10.5)
Chloride: 104 mEq/L (ref 96–112)
Creatinine, Ser: 1.09 mg/dL (ref 0.40–1.50)
GFR: 68.68 mL/min (ref >60.00)
Glucose, Bld: 113 mg/dL — ABNORMAL HIGH (ref 70–99)
Potassium: 4 mEq/L (ref 3.5–5.1)
Sodium: 141 mEq/L (ref 135–145)
Total Bilirubin: 0.8 mg/dL (ref 0.2–1.2)
Total Protein: 6.7 g/dL (ref 6.0–8.3)

## 2020-09-03 LAB — HEMOGLOBIN A1C: Hgb A1c MFr Bld: 6.9 % — ABNORMAL HIGH (ref 4.6–6.5)

## 2020-09-03 LAB — PSA: PSA: 0.54 ng/mL (ref 0.10–4.00)

## 2020-09-10 ENCOUNTER — Ambulatory Visit (INDEPENDENT_AMBULATORY_CARE_PROVIDER_SITE_OTHER): Payer: Medicare HMO | Admitting: Family Medicine

## 2020-09-10 ENCOUNTER — Encounter: Payer: Self-pay | Admitting: Family Medicine

## 2020-09-10 ENCOUNTER — Other Ambulatory Visit: Payer: Self-pay

## 2020-09-10 VITALS — BP 120/74 | HR 65 | Temp 97.7°F | Ht 67.5 in | Wt 152.0 lb

## 2020-09-10 DIAGNOSIS — Z800 Family history of malignant neoplasm of digestive organs: Secondary | ICD-10-CM | POA: Diagnosis not present

## 2020-09-10 DIAGNOSIS — E118 Type 2 diabetes mellitus with unspecified complications: Secondary | ICD-10-CM

## 2020-09-10 DIAGNOSIS — D72819 Decreased white blood cell count, unspecified: Secondary | ICD-10-CM

## 2020-09-10 DIAGNOSIS — E1169 Type 2 diabetes mellitus with other specified complication: Secondary | ICD-10-CM

## 2020-09-10 DIAGNOSIS — Z0000 Encounter for general adult medical examination without abnormal findings: Secondary | ICD-10-CM

## 2020-09-10 DIAGNOSIS — Z7189 Other specified counseling: Secondary | ICD-10-CM | POA: Diagnosis not present

## 2020-09-10 DIAGNOSIS — E785 Hyperlipidemia, unspecified: Secondary | ICD-10-CM

## 2020-09-10 MED ORDER — PIOGLITAZONE HCL 15 MG PO TABS: 15.0000 mg | ORAL_TABLET | Freq: Every day | ORAL | 3 refills | Status: DC

## 2020-09-10 MED ORDER — GLIPIZIDE 5 MG PO TABS: 5.0000 mg | ORAL_TABLET | Freq: Two times a day (BID) | ORAL | 3 refills | Status: DC

## 2020-09-10 MED ORDER — METFORMIN HCL 1000 MG PO TABS: 1000.0000 mg | ORAL_TABLET | Freq: Two times a day (BID) | ORAL | 3 refills | Status: DC

## 2020-09-10 MED ORDER — EMPAGLIFLOZIN 10 MG PO TABS: 10.0000 mg | ORAL_TABLET | Freq: Every day | ORAL | 3 refills | Status: DC

## 2020-09-10 MED ORDER — SIMVASTATIN 20 MG PO TABS: 20.0000 mg | ORAL_TABLET | Freq: Every day | ORAL | 3 refills | Status: DC

## 2020-09-10 MED ORDER — LISINOPRIL 2.5 MG PO TABS: 2.5000 mg | ORAL_TABLET | Freq: Every day | ORAL | 3 refills | Status: DC

## 2020-09-10 NOTE — Assessment & Plan Note (Signed)
Chronic, stable. Continue current regimen Will price out individual components of empagliflozin '10mg'$  + metformin '1000mg'$  bid in place of synjardy 05/998 bid

## 2020-09-10 NOTE — Assessment & Plan Note (Signed)
Preventative protocols reviewed and updated unless pt declined. Discussed healthy diet and lifestyle.  

## 2020-09-10 NOTE — Assessment & Plan Note (Signed)
Chronic, stable 

## 2020-09-10 NOTE — Assessment & Plan Note (Signed)
Brother recently diagnosed with colon cancer age 70. We referred last year back to GI to discussed indication of sooner repeat colonoscopy however pt did not go as insurance said it would not cover rpt colonoscopy prior to 10 yrs.

## 2020-09-10 NOTE — Patient Instructions (Addendum)
Bring [name] copy of your advanced directives/living will.  You are doing well today Return as needed or in 6 months for follow up visit.   Health Maintenance After Age 70 After age 29, you are at a higher risk for certain long-term diseases and infections as well as injuries from falls. Falls are a major cause of broken bones and head injuries in people who are older than age 76. Getting regular preventive care can help to keep you healthy and well. Preventive care includes getting regular testing and making lifestyle changes as recommended by your health care provider. Talk with your health care provider about: Which screenings and tests you should have. A screening is a test that checks for a disease when you have no symptoms. A diet and exercise plan that is right for you. What should I know about screenings and tests to prevent falls? Screening and testing are the best ways to find a health problem early. Early diagnosis and treatment give you the best chance of managing medical conditions that are common after age 62. Certain conditions and lifestyle choices may make you more likely to have a fall. Your health care provider may recommend: Regular vision checks. Poor vision and conditions such as cataracts can make you more likely to have a fall. If you wear glasses, make sure to get your prescription updated if your vision changes. Medicine review. Work with your health care provider to regularly review all of the medicines you are taking, including over-the-counter medicines. Ask your health care provider about any side effects that may make you more likely to have a fall. Tell your health care provider if any medicines that you take make you feel dizzy or sleepy. Osteoporosis screening. Osteoporosis is a condition that causes the bones to get weaker. This can make the bones weak and cause them to break more easily. Blood pressure screening. Blood pressure changes and medicines to control blood  pressure can make you feel dizzy. Strength and balance checks. Your health care provider may recommend certain tests to check your strength and balance while standing, walking, or changing positions. Foot health exam. Foot pain and numbness, as well as not wearing proper footwear, can make you more likely to have a fall. Depression screening. You may be more likely to have a fall if you have a fear of falling, feel emotionally low, or feel unable to do activities that you used to do. Alcohol use screening. Using too much alcohol can affect your balance and may make you more likely to have a fall. What actions can I take to lower my risk of falls? General instructions Talk with your health care provider about your risks for falling. Tell your health care provider if: You fall. Be sure to tell your health care provider about all falls, even ones that seem minor. You feel dizzy, sleepy, or off-balance. Take over-the-counter and prescription medicines only as told by your health care provider. These include any supplements. Eat a healthy diet and maintain a healthy weight. A healthy diet includes low-fat dairy products, low-fat (lean) meats, and fiber from whole grains, beans, and lots of fruits and vegetables. Home safety Remove any tripping hazards, such as rugs, cords, and clutter. Install safety equipment such as grab bars in bathrooms and safety rails on stairs. Keep rooms and walkways well-lit. Activity  Follow a regular exercise program to stay fit. This will help you maintain your balance. Ask your health care provider what types of exercise are appropriate for  you. If you need a cane or walker, use it as recommended by your health care provider. Wear supportive shoes that have nonskid soles.  Lifestyle Do not drink alcohol if your health care provider tells you not to drink. If you drink alcohol, limit how much you have: 0-1 drink a day for women. 0-2 drinks a day for men. Be aware of  how much alcohol is in your drink. In the [place] one drink equals one typical bottle of beer (12 oz), one-half glass of wine (5 oz), or one shot of hard liquor (1 oz). Do not use any products that contain nicotine or tobacco, such as cigarettes and e-cigarettes. If you need help quitting, ask your health care provider. Summary Having a healthy lifestyle and getting preventive care can help to protect your health and wellness after age 57. Screening and testing are the best way to find a health problem early and help you avoid having a fall. Early diagnosis and treatment give you the best chance for managing medical conditions that are more common for people who are older than age 41. Falls are a major cause of broken bones and head injuries in people who are older than age 37. Take precautions to prevent a fall at home. Work with your health care provider to learn what changes you can make to improve your health and wellness and to prevent falls. This information is not intended to replace advice given to you by your health care provider. Make sure you discuss any questions you have with your healthcare provider. Document Revised: 12/22/2019 Document Reviewed: 12/22/2019 Elsevier Patient Education  2022 [employer].

## 2020-09-10 NOTE — Assessment & Plan Note (Signed)
Chronic, well controlled on simvastatin '20mg'$  - continue. The 10-year ASCVD risk score Mikey Bussing DC Brooke Bonito., et al., 2013) is: 28.4%   Values used to calculate the score:     Age: 70 years     Sex: Male     Is Non-Hispanic African American: No     Diabetic: Yes     Tobacco smoker: No     Systolic Blood Pressure: 123456 mmHg     Is BP treated: Yes     HDL Cholesterol: 48.2 mg/dL     Total Cholesterol: 137 mg/dL

## 2020-09-10 NOTE — Assessment & Plan Note (Signed)
Advanced directive discussion - has at home. HCPOA would be wife. Will check at home and bring [name] copy.

## 2020-09-10 NOTE — Progress Notes (Signed)
Patient ID: Eduardo Pierce, male    DOB: 05/10/1950, 70 y.o.   MRN: [id number]  This visit was conducted in person.  BP 120/74   Pulse 65   Temp 97.7 F (36.5 C) (Temporal)   Ht 5' 7.5" (1.715 m)   Wt 152 lb (68.9 kg)   SpO2 99%   BMI 23.46 kg/m    CC: CPE Subjective:   HPI: Eduardo Pierce is a 70 y.o. male presenting on 09/10/2020 for Annual Exam   Saw health advisor earlier this month for medicare wellness visit. Note reviewed.    Vision Screening - Comments:: Patient gets annual eye exams  Flowsheet Row Clinical Support from 08/22/2020 in [facility] [place]  PHQ-2 Total Score 0       Fall Risk  08/22/2020 08/03/2019 07/11/2018 06/17/2017 05/20/2016  Falls in the past year? 0 0 0 No No  Number falls in past yr: 0 - - - -  Injury with Fall? 0 - - - -  Risk for fall due to : Medication side effect - - - -  Follow up Falls evaluation completed;Falls prevention discussed - - - -     Preventative: Colon screening - 06/08/2011. Prolapse type polyp, rpt due 10 yrs Ardis Hughs).   Prostate - yearly PSA check. Nocturia x1, strong stream.  Lung cancer screening - quit smoking 1988  Influenza - 01/2019 COVID vaccine - [place] 02/2019, 03/2019, booster 09/2019 Prevnar-13 06/2017. Pneumovax-23 06/2018 Td - 2006  Zostavax 09/2012  Shingrix - 08/2018, 11/2018  Advanced directive discussion - has at home. HCPOA would be wife. Will check at home and bring [name] copy.  Seat belt use discussed  Sunscreen use discussed. No changing moles on skin. Sees derm yearly - planned blue light treatments for L>R cheek skin lesions.  Ex smoker. Quit 1988. Wife smokes at home. 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 Sugar free diet      Relevant past medical, surgical, family and social history reviewed and updated as indicated. Interim medical history since our last visit reviewed. Allergies and medications reviewed and updated. Outpatient Medications Prior to Visit  Medication Sig Dispense Refill   aspirin EC 81 MG tablet Take 1 tablet (81 mg total) by mouth every other day.     Cyanocobalamin (VITAMIN B-12 CR) 1000 MCG TBCR Take 1 tablet by mouth daily.     FREESTYLE LITE test strip TEST BLOOD SUGAR EVERY DAY AND AS NEEDED 300 each 3   Lancets MISC Use to check blood sugar daily     SYNJARDY 05-998 MG TABS TAKE 1 TABLET BY MOUTH TWICE A DAY 60 tablet 2   vitamin C (ASCORBIC ACID) 500 MG tablet Take 500 mg by mouth daily.     glipiZIDE (GLUCOTROL) 5 MG tablet TAKE 1 TABLET (5 MG TOTAL) BY MOUTH 2 (TWO) TIMES DAILY BEFORE A MEAL. 180 tablet 0   lisinopril (ZESTRIL) 2.5 MG tablet TAKE 1 TABLET BY MOUTH EVERY DAY 90 tablet 0   pioglitazone (ACTOS) 15 MG tablet TAKE 1 TABLET BY MOUTH EVERY DAY 90 tablet 0  simvastatin (ZOCOR) 20 MG tablet TAKE 1 TABLET BY MOUTH EVERYDAY AT BEDTIME 90 tablet 0   No facility-administered medications prior to visit.     Per HPI unless specifically indicated in ROS section below Review of Systems  Constitutional:  Negative for activity change, appetite change, chills, fatigue, fever and unexpected weight change.  HENT:  Negative for hearing loss.   Eyes:  Negative for visual disturbance.  Respiratory:  Negative for cough, chest tightness, shortness of breath and wheezing.   Cardiovascular:  Negative for chest pain, palpitations and leg swelling.  Gastrointestinal:  Negative for abdominal distention, abdominal pain, blood in stool, constipation, diarrhea, nausea and vomiting.  Genitourinary:  Negative for difficulty urinating and hematuria.  Musculoskeletal:  Negative for arthralgias, myalgias and neck pain.   Skin:  Negative for rash.  Neurological:  Negative for dizziness, seizures, syncope and headaches.  Hematological:  Negative for adenopathy. Bruises/bleeds easily.  Psychiatric/Behavioral:  Negative for dysphoric mood. The patient is not nervous/anxious.    Objective:  BP 120/74   Pulse 65   Temp 97.7 F (36.5 C) (Temporal)   Ht 5' 7.5" (1.715 m)   Wt 152 lb (68.9 kg)   SpO2 99%   BMI 23.46 kg/m   Wt Readings from Last 3 Encounters:  09/10/20 152 lb (68.9 kg)  02/21/20 152 lb 2 oz (69 kg)  08/03/19 149 lb 3 oz (67.7 kg)      Physical Exam Vitals and nursing note reviewed.  Constitutional:      General: He is not in acute distress.    Appearance: Normal appearance. He is well-developed. He is not ill-appearing.  HENT:     Head: Normocephalic and atraumatic.     Right Ear: Hearing, tympanic membrane, ear canal and external ear normal.     Left Ear: Hearing, tympanic membrane, ear canal and external ear normal.  Eyes:     General: No scleral icterus.    Extraocular Movements: Extraocular movements intact.     Conjunctiva/sclera: Conjunctivae normal.     Pupils: Pupils are equal, round, and reactive to light.  Neck:     Thyroid: No thyroid mass or thyromegaly.     Vascular: Carotid bruit (faint left sided, not heard on repeat auscultation) present.  Cardiovascular:     Rate and Rhythm: Normal rate and regular rhythm.     Pulses: Normal pulses.          Radial pulses are 2+ on the right side and 2+ on the left side.     Heart sounds: Normal heart sounds. No murmur heard. Pulmonary:     Effort: Pulmonary effort is normal. No respiratory distress.     Breath sounds: Normal breath sounds. No wheezing, rhonchi or rales.  Abdominal:     General: Bowel sounds are normal. There is no distension.     Palpations: Abdomen is soft. There is no mass.     Tenderness: There is no abdominal tenderness. There is no guarding or rebound.     Hernia: No hernia is present.   Musculoskeletal:        General: Normal range of motion.     Cervical back: Normal range of motion and neck supple.     Right lower leg: No edema.     Left lower leg: No edema.  Lymphadenopathy:     Cervical: No cervical adenopathy.  Skin:    General: Skin is warm and dry.     Findings: No rash.  Neurological:  General: No focal deficit present.     Mental Status: He is alert and oriented to person, place, and time.  Psychiatric:        Mood and Affect: Mood normal.        Behavior: Behavior normal.        Thought Content: Thought content normal.        Judgment: Judgment normal.      Results for orders placed or performed in visit on 09/03/20  CBC with Differential/Platelet  Result Value Ref Range   WBC 3.4 (L) 4.0 - 10.5 K/uL   RBC 4.47 4.22 - 5.81 Mil/uL   Hemoglobin 14.5 13.0 - 17.0 g/dL   HCT 43.4 39.0 - 52.0 %   MCV 96.9 78.0 - 100.0 fl   MCHC 33.5 30.0 - 36.0 g/dL   RDW 13.9 11.5 - 15.5 %   Platelets 163.0 150.0 - 400.0 K/uL   Neutrophils Relative % 60.8 43.0 - 77.0 %   Lymphocytes Relative 20.8 12.0 - 46.0 %   Monocytes Relative 12.4 (H) 3.0 - 12.0 %   Eosinophils Relative 4.9 0.0 - 5.0 %   Basophils Relative 1.1 0.0 - 3.0 %   Neutro Abs 2.0 1.4 - 7.7 K/uL   Lymphs Abs 0.7 0.7 - 4.0 K/uL   Monocytes Absolute 0.4 0.1 - 1.0 K/uL   Eosinophils Absolute 0.2 0.0 - 0.7 K/uL   Basophils Absolute 0.0 0.0 - 0.1 K/uL  PSA  Result Value Ref Range   PSA 0.54 0.10 - 4.00 ng/mL  Hemoglobin A1c  Result Value Ref Range   Hgb A1c MFr Bld 6.9 (H) 4.6 - 6.5 %  Comprehensive metabolic panel  Result Value Ref Range   Sodium 141 135 - 145 mEq/L   Potassium 4.0 3.5 - 5.1 mEq/L   Chloride 104 96 - 112 mEq/L   CO2 26 19 - 32 mEq/L   Glucose, Bld 113 (H) 70 - 99 mg/dL   BUN 20 6 - 23 mg/dL   Creatinine, Ser 1.09 0.40 - 1.50 mg/dL   Total Bilirubin 0.8 0.2 - 1.2 mg/dL   Alkaline Phosphatase 46 39 - 117 U/L   AST 16 0 - 37 U/L   ALT 13 0 - 53 U/L   Total Protein 6.7 6.0 -  8.3 g/dL   Albumin 4.5 3.5 - 5.2 g/dL   GFR 68.68 >60.00 mL/min   Calcium 9.4 8.4 - 10.5 mg/dL  Lipid panel  Result Value Ref Range   Cholesterol 137 0 - 200 mg/dL   Triglycerides 86.0 0.0 - 149.0 mg/dL   HDL 48.20 >39.00 mg/dL   VLDL 17.2 0.0 - 40.0 mg/dL   LDL Cholesterol 72 0 - 99 mg/dL   Total CHOL/HDL Ratio 3    NonHDL 89.08     Assessment & Plan:  This visit occurred during the SARS-CoV-2 public health emergency.  Safety protocols were in place, including screening questions prior to the visit, additional usage of staff PPE, and extensive cleaning of exam room while observing appropriate contact time as indicated for disinfecting solutions.   Problem List Items Addressed This Visit     Advanced care planning/counseling discussion (Chronic)    Advanced directive discussion - has at home. HCPOA would be wife. Will check at home and bring [name] copy.       Health maintenance examination - Primary (Chronic)    Preventative protocols reviewed and updated unless pt declined. Discussed healthy diet and lifestyle.       Controlled  diabetes mellitus type 2 with complications (HCC)    Chronic, stable. Continue current regimen Will price out individual components of empagliflozin '10mg'$  + metformin '1000mg'$  bid in place of synjardy 05/998 bid      Relevant Medications   glipiZIDE (GLUCOTROL) 5 MG tablet   lisinopril (ZESTRIL) 2.5 MG tablet   pioglitazone (ACTOS) 15 MG tablet   simvastatin (ZOCOR) 20 MG tablet   metFORMIN (GLUCOPHAGE) 1000 MG tablet   empagliflozin (JARDIANCE) 10 MG TABS tablet   Hyperlipidemia associated with type 2 diabetes mellitus (HCC)    Chronic, well controlled on simvastatin '20mg'$  - continue. The 10-year ASCVD risk score Mikey Bussing DC Brooke Bonito., et al., 2013) is: 28.4%   Values used to calculate the score:     Age: 89 years     Sex: Male     Is Non-Hispanic African American: No     Diabetic: Yes     Tobacco smoker: No     Systolic Blood Pressure: 123456 mmHg     Is BP  treated: Yes     HDL Cholesterol: 48.2 mg/dL     Total Cholesterol: 137 mg/dL       Relevant Medications   glipiZIDE (GLUCOTROL) 5 MG tablet   lisinopril (ZESTRIL) 2.5 MG tablet   pioglitazone (ACTOS) 15 MG tablet   simvastatin (ZOCOR) 20 MG tablet   metFORMIN (GLUCOPHAGE) 1000 MG tablet   empagliflozin (JARDIANCE) 10 MG TABS tablet   Leukopenia    Chronic, stable.       Family history of colon cancer    Brother recently diagnosed with colon cancer age 106. We referred last year back to GI to discussed indication of sooner repeat colonoscopy however pt did not go as insurance said it would not cover rpt colonoscopy prior to 10 yrs.         Meds ordered this encounter  Medications   glipiZIDE (GLUCOTROL) 5 MG tablet    Sig: Take 1 tablet (5 mg total) by mouth 2 (two) times daily before a meal.    Dispense:  180 tablet    Refill:  3   lisinopril (ZESTRIL) 2.5 MG tablet    Sig: Take 1 tablet (2.5 mg total) by mouth daily.    Dispense:  90 tablet    Refill:  3   pioglitazone (ACTOS) 15 MG tablet    Sig: Take 1 tablet (15 mg total) by mouth daily.    Dispense:  90 tablet    Refill:  3   simvastatin (ZOCOR) 20 MG tablet    Sig: Take 1 tablet (20 mg total) by mouth daily at 6 PM.    Dispense:  90 tablet    Refill:  3   metFORMIN (GLUCOPHAGE) 1000 MG tablet    Sig: Take 1 tablet (1,000 mg total) by mouth 2 (two) times daily with a meal.    Dispense:  180 tablet    Refill:  3    To replace synjardy - please price out   empagliflozin (JARDIANCE) 10 MG TABS tablet    Sig: Take 1 tablet (10 mg total) by mouth daily before breakfast.    Dispense:  90 tablet    Refill:  3    To replace synjardy - please price out   No orders of the defined types were placed in this encounter.    Patient instructions: Bring [name] copy of your advanced directives/living will.  You are doing well today Return as needed or in 6 months for follow up visit  Follow up plan: Return in about 6  months (around 03/13/2021) for follow up visit.  Ria Bush, MD

## 2020-11-06 ENCOUNTER — Other Ambulatory Visit: Payer: Self-pay | Admitting: Family Medicine

## 2020-11-07 NOTE — Telephone Encounter (Signed)
Per last OV (09/10/20), pt was to price out individual components of Synjardy [empagliflozin 10 mg + metformin 1,000 mg] taken twice a day in place of Synjardy 5-1,000 mg twice a day.  Attempted to contact pt.  No answer.  Need to know if pt was able to price out each component separately or if he decided to continue the combination tablet Synjardy.

## 2020-11-08 NOTE — Telephone Encounter (Signed)
Per last OV (09/10/20), pt was to price out individual components of Synjardy [empagliflozin 10 mg + metformin 1,000 mg] taken twice a day in place of Synjardy 5-1,000 mg twice a day.  Attempted to contact pt.  No answer.  Need to know if pt was able to price out each component separately or if he decided to continue the combination tablet Synjardy.

## 2020-11-12 ENCOUNTER — Telehealth: Payer: Self-pay | Admitting: Family Medicine

## 2020-11-12 MED ORDER — SYNJARDY 5-1000 MG PO TABS: 1.0000 | ORAL_TABLET | Freq: Two times a day (BID) | ORAL | 3 refills | Status: DC

## 2020-11-12 NOTE — Telephone Encounter (Signed)
LAST APPOINTMENT DATE: 09/10/2020  NEXT APPOINTMENT DATE: 03/14/2021  LAST REFILL: 01/15/2020  QTY: 60 tab 2 refills

## 2020-11-12 NOTE — Telephone Encounter (Signed)
See other phone note

## 2020-11-12 NOTE — Telephone Encounter (Signed)
  Encourage patient to contact the pharmacy for refills or they can request refills through [facility]:  Please schedule appointment if longer than 1 year  NEXT APPOINTMENT DATE:  MEDICATION:SYNJARDY 05-998 MG TABS  Is the patient out of medication?   PHARMACY:  Let patient know to contact pharmacy at the end of the day to make sure medication is ready.  Please notify patient to allow 48-72 hours to process  CLINICAL FILLS OUT ALL BELOW:   LAST REFILL:  QTY:  REFILL DATE:    OTHER COMMENTS:    Okay for refill?  Please advise

## 2020-11-12 NOTE — Telephone Encounter (Signed)
I've refilled. See recent refill request - need to confirm with patient he's taking this in place of the separate components jardiance and metformin. Previously could not afford.

## 2020-11-12 NOTE — Telephone Encounter (Signed)
  Encourage patient to contact the pharmacy for refills or they can request refills through [facility]:  Please schedule appointment if longer than 1 year  NEXT APPOINTMENT DATE:03/14/21  MEDICATION:SYNJARDY 05-998 MG TABS  Is the patient out of medication? no  PHARMACY:[id number] - [place], [place] - [address]  Let patient know to contact pharmacy at the end of the day to make sure medication is ready.  Please notify patient to allow 48-72 hours to process  CLINICAL FILLS OUT ALL BELOW:   LAST REFILL:  QTY:  REFILL DATE:    OTHER COMMENTS:    Okay for refill?  Please advise

## 2020-11-14 NOTE — Telephone Encounter (Addendum)
Pt returning call.  I relayed Dr. Synthia Innocent message.  Pt confirms he is taking combo pill and wants to continue combo pill.  States it was more expensive separately.  FYI to Dr. Darnell Level.

## 2020-11-14 NOTE — Telephone Encounter (Signed)
Noted. Thanks.

## 2020-11-14 NOTE — Addendum Note (Signed)
Addended by: Ria Bush on: 11/14/2020 08:31 PM   Modules accepted: Orders

## 2020-11-14 NOTE — Telephone Encounter (Signed)
Left message with wife, Peter Congo (not on dpr), asking pt to call back.  Need to relay Dr. Synthia Innocent message.

## 2021-01-23 DIAGNOSIS — Z961 Presence of intraocular lens: Secondary | ICD-10-CM | POA: Diagnosis not present

## 2021-01-23 DIAGNOSIS — S0512XA Contusion of eyeball and orbital tissues, left eye, initial encounter: Secondary | ICD-10-CM | POA: Diagnosis not present

## 2021-01-23 DIAGNOSIS — E119 Type 2 diabetes mellitus without complications: Secondary | ICD-10-CM | POA: Diagnosis not present

## 2021-01-23 DIAGNOSIS — H11131 Conjunctival pigmentations, right eye: Secondary | ICD-10-CM | POA: Diagnosis not present

## 2021-01-23 DIAGNOSIS — H25812 Combined forms of age-related cataract, left eye: Secondary | ICD-10-CM | POA: Diagnosis not present

## 2021-01-23 DIAGNOSIS — H40013 Open angle with borderline findings, low risk, bilateral: Secondary | ICD-10-CM | POA: Diagnosis not present

## 2021-01-23 DIAGNOSIS — H1132 Conjunctival hemorrhage, left eye: Secondary | ICD-10-CM | POA: Diagnosis not present

## 2021-03-10 ENCOUNTER — Other Ambulatory Visit: Payer: Self-pay | Admitting: Family Medicine

## 2021-03-14 ENCOUNTER — Other Ambulatory Visit: Payer: Self-pay

## 2021-03-14 ENCOUNTER — Ambulatory Visit (INDEPENDENT_AMBULATORY_CARE_PROVIDER_SITE_OTHER): Payer: Medicare HMO | Admitting: Family Medicine

## 2021-03-14 ENCOUNTER — Encounter: Payer: Self-pay | Admitting: Family Medicine

## 2021-03-14 VITALS — BP 120/62 | HR 78 | Temp 97.4°F | Ht 67.5 in | Wt 151.2 lb

## 2021-03-14 DIAGNOSIS — Z800 Family history of malignant neoplasm of digestive organs: Secondary | ICD-10-CM | POA: Diagnosis not present

## 2021-03-14 DIAGNOSIS — E118 Type 2 diabetes mellitus with unspecified complications: Secondary | ICD-10-CM

## 2021-03-14 LAB — POCT GLYCOSYLATED HEMOGLOBIN (HGB A1C): Hemoglobin A1C: 6.9 % — AB (ref 4.0–5.6)

## 2021-03-14 MED ORDER — SYNJARDY 5-1000 MG PO TABS: 1.0000 | ORAL_TABLET | Freq: Two times a day (BID) | ORAL | 7 refills | Status: DC

## 2021-03-14 NOTE — Assessment & Plan Note (Signed)
Clarified - pt states his brother was diagnosed with benign colon polyps, not cancer. Will remove this from problem list.

## 2021-03-14 NOTE — Assessment & Plan Note (Addendum)
Chronic, stable on current regimen - continue. Encouraged continued efforts towards diabetic diet.  rec lotrimin cream and patting skin dry after shower for maceration between toes.

## 2021-03-14 NOTE — Patient Instructions (Addendum)
We will request latest diabetic eye exam from Dr Zenia Resides office.  Good to see you today Return as needed or in 6 months for physical.

## 2021-03-14 NOTE — Progress Notes (Signed)
Patient ID: Eduardo Pierce, male    DOB: 1950-05-06, 71 y.o.   MRN: [id number]  This visit was conducted in person.  BP 120/62    Pulse 78    Temp (!) 97.4 F (36.3 C) (Temporal)    Ht 5' 7.5" (1.715 m)    Wt 151 lb 4 oz (68.6 kg)    SpO2 97%    BMI 23.34 kg/m    CC: 6 mo DM f/u visit  Subjective:   HPI: Eduardo Pierce is a 71 y.o. male presenting on 03/14/2021 for Diabetes (Here for 6 mo f/u.) "[name]"   Planning L eye cataract surgery 04/2021 S/p R eye cataract surgery 5-6 yrs ago.   T2DM - does regularly check fasting sugars daily 90-130, occasional postprandial 150. Compliant with antihyperglycemic regimen which includes: glipizide 5mg  BID, actos 15mg  daily, Synjardy 05/998 BID (empagliflozin/metformin). Using [place] and splenda. Denies low sugars or hypoglycemic symptoms. Denies paresthesias, blurry vision. Last diabetic eye exam 11/2020 - Dr Katy Fitch. Glucometer brand: Freestyle lite. Last foot exam: 02/2020 - due. DSME: when initially diagnosed 1997.  Lab Results  Component Value Date   HGBA1C 6.9 (A) 03/14/2021   Diabetic Foot Exam - Simple   Simple Foot Form Diabetic Foot exam was performed with the following findings: Yes 03/14/2021  8:23 AM  Visual Inspection See comments: Yes Sensation Testing Intact to touch and monofilament testing bilaterally: Yes Pulse Check Posterior Tibialis and Dorsalis pulse intact bilaterally: Yes Comments R plantar fibroma, nontender Early onychomycosis to great toes Maceration between toes bilaterally    Lab Results  Component Value Date   MICROALBUR <0.7 09/17/2015        Relevant past medical, surgical, family and social history reviewed and updated as indicated. Interim medical history since our last visit reviewed. Allergies and medications reviewed and updated. Outpatient Medications Prior to Visit  Medication Sig Dispense Refill   aspirin EC 81 MG tablet Take 1 tablet (81 mg total) by mouth every other day.      Cyanocobalamin (VITAMIN B-12 CR) 1000 MCG TBCR Take 1 tablet by mouth daily.     FREESTYLE LITE test strip TEST BLOOD SUGAR EVERY DAY AND AS NEEDED 300 each 3   glipiZIDE (GLUCOTROL) 5 MG tablet Take 1 tablet (5 mg total) by mouth 2 (two) times daily before a meal. 180 tablet 3   Lancets MISC Use to check blood sugar daily     lisinopril (ZESTRIL) 2.5 MG tablet Take 1 tablet (2.5 mg total) by mouth daily. 90 tablet 3   pioglitazone (ACTOS) 15 MG tablet Take 1 tablet (15 mg total) by mouth daily. 90 tablet 3   simvastatin (ZOCOR) 20 MG tablet Take 1 tablet (20 mg total) by mouth daily at 6 PM. 90 tablet 3   vitamin C (ASCORBIC ACID) 500 MG tablet Take 500 mg by mouth daily.     SYNJARDY 05-998 MG TABS TAKE 1 TABLET BY MOUTH TWICE A DAY 60 tablet 0   No facility-administered medications prior to visit.     Per HPI unless specifically indicated in ROS section below Review of Systems  Objective:  BP 120/62    Pulse 78    Temp (!) 97.4 F (36.3 C) (Temporal)    Ht 5' 7.5" (1.715 m)    Wt 151 lb 4 oz (68.6 kg)    SpO2 97%    BMI 23.34 kg/m   Wt Readings from Last 3 Encounters:  03/14/21 151 lb 4 oz (68.6  kg)  09/10/20 152 lb (68.9 kg)  02/21/20 152 lb 2 oz (69 kg)      Physical Exam Vitals and nursing note reviewed.  Constitutional:      Appearance: Normal appearance. He is not ill-appearing.  Eyes:     Extraocular Movements: Extraocular movements intact.     Conjunctiva/sclera: Conjunctivae normal.     Pupils: Pupils are equal, round, and reactive to light.  Cardiovascular:     Rate and Rhythm: Normal rate and regular rhythm.     Pulses: Normal pulses.     Heart sounds: Normal heart sounds. No murmur heard. Pulmonary:     Effort: Pulmonary effort is normal. No respiratory distress.     Breath sounds: Normal breath sounds. No wheezing, rhonchi or rales.  Musculoskeletal:     Right lower leg: No edema.     Left lower leg: No edema.     Comments: See HPI for foot exam if done   Skin:    General: Skin is warm and dry.     Findings: No rash.  Neurological:     Mental Status: He is alert.  Psychiatric:        Mood and Affect: Mood normal.        Behavior: Behavior normal.      Results for orders placed or performed in visit on 03/14/21  POCT glycosylated hemoglobin (Hb A1C)  Result Value Ref Range   Hemoglobin A1C 6.9 (A) 4.0 - 5.6 %   HbA1c POC (<> result, manual entry)     HbA1c, POC (prediabetic range)     HbA1c, POC (controlled diabetic range)      Assessment & Plan:  This visit occurred during the SARS-CoV-2 public health emergency.  Safety protocols were in place, including screening questions prior to the visit, additional usage of staff PPE, and extensive cleaning of exam room while observing appropriate contact time as indicated for disinfecting solutions.   Problem List Items Addressed This Visit     Controlled diabetes mellitus type 2 with complications ([place]) - Primary    Chronic, stable on current regimen - continue. Encouraged continued efforts towards diabetic diet.  rec lotrimin cream and patting skin dry after shower for maceration between toes.       Relevant Medications   Empagliflozin-metFORMIN HCl (SYNJARDY) 05-998 MG TABS   Other Relevant Orders   POCT glycosylated hemoglobin (Hb A1C) (Completed)   RESOLVED: Family history of colon cancer    Clarified - pt states his brother was diagnosed with benign colon polyps, not cancer. Will remove this from problem list.         Meds ordered this encounter  Medications   Empagliflozin-metFORMIN HCl (SYNJARDY) 05-998 MG TABS    Sig: Take 1 tablet by mouth 2 (two) times daily.    Dispense:  60 tablet    Refill:  7   Orders Placed This Encounter  Procedures   POCT glycosylated hemoglobin (Hb A1C)     Patient Instructions  We will request latest diabetic eye exam from Dr Zenia Resides office.  Good to see you today Return as needed or in 6 months for physical.   Follow up  plan: Return in about 6 months (around 09/11/2021) for annual exam, prior fasting for blood work.  Ria Bush, MD

## 2021-04-29 DIAGNOSIS — Z961 Presence of intraocular lens: Secondary | ICD-10-CM | POA: Diagnosis not present

## 2021-04-29 DIAGNOSIS — E119 Type 2 diabetes mellitus without complications: Secondary | ICD-10-CM | POA: Diagnosis not present

## 2021-04-29 DIAGNOSIS — H1132 Conjunctival hemorrhage, left eye: Secondary | ICD-10-CM | POA: Diagnosis not present

## 2021-04-29 DIAGNOSIS — S0512XA Contusion of eyeball and orbital tissues, left eye, initial encounter: Secondary | ICD-10-CM | POA: Diagnosis not present

## 2021-04-29 DIAGNOSIS — H25812 Combined forms of age-related cataract, left eye: Secondary | ICD-10-CM | POA: Diagnosis not present

## 2021-04-29 DIAGNOSIS — H40013 Open angle with borderline findings, low risk, bilateral: Secondary | ICD-10-CM | POA: Diagnosis not present

## 2021-04-29 DIAGNOSIS — H11131 Conjunctival pigmentations, right eye: Secondary | ICD-10-CM | POA: Diagnosis not present

## 2021-04-29 LAB — HM DIABETES EYE EXAM

## 2021-05-16 DIAGNOSIS — H25812 Combined forms of age-related cataract, left eye: Secondary | ICD-10-CM | POA: Diagnosis not present

## 2021-06-27 ENCOUNTER — Encounter: Payer: Self-pay | Admitting: Gastroenterology

## 2021-08-11 ENCOUNTER — Encounter: Payer: Self-pay | Admitting: Internal Medicine

## 2021-08-28 ENCOUNTER — Ambulatory Visit (INDEPENDENT_AMBULATORY_CARE_PROVIDER_SITE_OTHER): Payer: Medicare HMO

## 2021-08-28 VITALS — Wt 151.0 lb

## 2021-08-28 DIAGNOSIS — Z0000 Encounter for general adult medical examination without abnormal findings: Secondary | ICD-10-CM | POA: Diagnosis not present

## 2021-08-28 NOTE — Patient Instructions (Signed)
Eduardo Pierce , Thank you for taking time to come for your Medicare Wellness Visit. I appreciate your ongoing commitment to your health goals. Please review the following plan we discussed and let me know if I can assist you in the future.   Screening recommendations/referrals: Colonoscopy: has appt scheduled at end of September for his 10 year checkup Recommended yearly ophthalmology/optometry visit for glaucoma screening and checkup Recommended yearly dental visit for hygiene and checkup  Vaccinations: Influenza vaccine: n/d Pneumococcal vaccine: 07/19/18 Tdap vaccine: 11/19/04, due if have injury Shingles vaccine: Zostavax 10/18/12  Shingrix 09/13/18, 12/12/18   Covid-19: 02/24/19, 03/21/19, 10/10/19, 11/29/20  Advanced directives: no  Conditions/risks identified: none  Next appointment: Follow up in one year for your annual wellness visit. 09/01/22 @ 1:15 pm by phone  Preventive Care 65 Years and Older, Male Preventive care refers to lifestyle choices and visits with your health care provider that can promote health and wellness. What does preventive care include? A yearly physical exam. This is also called an annual well check. Dental exams once or twice a year. Routine eye exams. Ask your health care provider how often you should have your eyes checked. Personal lifestyle choices, including: Daily care of your teeth and gums. Regular physical activity. Eating a healthy diet. Avoiding tobacco and drug use. Limiting alcohol use. Practicing safe sex. Taking low doses of aspirin every day. Taking vitamin and mineral supplements as recommended by your health care provider. What happens during an annual well check? The services and screenings done by your health care provider during your annual well check will depend on your age, overall health, lifestyle risk factors, and family history of disease. Counseling  Your health care provider may ask you questions about your: Alcohol  use. Tobacco use. Drug use. Emotional well-being. Home and relationship well-being. Sexual activity. Eating habits. History of falls. Memory and ability to understand (cognition). Work and work Statistician. Screening  You may have the following tests or measurements: Height, weight, and BMI. Blood pressure. Lipid and cholesterol levels. These may be checked every 5 years, or more frequently if you are over 68 years old. Skin check. Lung cancer screening. You may have this screening every year starting at age 56 if you have a 30-pack-year history of smoking and currently smoke or have quit within the past 15 years. Fecal occult blood test (FOBT) of the stool. You may have this test every year starting at age 58. Flexible sigmoidoscopy or colonoscopy. You may have a sigmoidoscopy every 5 years or a colonoscopy every 10 years starting at age 62. Prostate cancer screening. Recommendations will vary depending on your family history and other risks. Hepatitis C blood test. Hepatitis B blood test. Sexually transmitted disease (STD) testing. Diabetes screening. This is done by checking your blood sugar (glucose) after you have not eaten for a while (fasting). You may have this done every 1-3 years. Abdominal aortic aneurysm (AAA) screening. You may need this if you are a current or former smoker. Osteoporosis. You may be screened starting at age 33 if you are at high risk. Talk with your health care provider about your test results, treatment options, and if necessary, the need for more tests. Vaccines  Your health care provider may recommend certain vaccines, such as: Influenza vaccine. This is recommended every year. Tetanus, diphtheria, and acellular pertussis (Tdap, Td) vaccine. You may need a Td booster every 10 years. Zoster vaccine. You may need this after age 22. Pneumococcal 13-valent conjugate (PCV13) vaccine. One dose  is recommended after age 9. Pneumococcal polysaccharide  (PPSV23) vaccine. One dose is recommended after age 89. Talk to your health care provider about which screenings and vaccines you need and how often you need them. This information is not intended to replace advice given to you by your health care provider. Make sure you discuss any questions you have with your health care provider. Document Released: 02/01/2015 Document Revised: 09/25/2015 Document Reviewed: 11/06/2014 Elsevier Interactive Patient Education  2017 [employer] Prevention in the Home Falls can cause injuries. They can happen to people of all ages. There are many things you can do to make your home safe and to help prevent falls. What can I do on the outside of my home? Regularly fix the edges of walkways and driveways and fix any cracks. Remove anything that might make you trip as you walk through a door, such as a raised step or threshold. Trim any bushes or trees on the path to your home. Use bright outdoor lighting. Clear any walking paths of anything that might make someone trip, such as rocks or tools. Regularly check to see if handrails are loose or broken. Make sure that both sides of any steps have handrails. Any raised decks and porches should have guardrails on the edges. Have any leaves, snow, or ice cleared regularly. Use sand or salt on walking paths during winter. Clean up any spills in your garage right away. This includes oil or grease spills. What can I do in the bathroom? Use night lights. Install grab bars by the toilet and in the tub and shower. Do not use towel bars as grab bars. Use non-skid mats or decals in the tub or shower. If you need to sit down in the shower, use a plastic, non-slip stool. Keep the floor dry. Clean up any water that spills on the floor as soon as it happens. Remove soap buildup in the tub or shower regularly. Attach bath mats securely with double-sided non-slip rug tape. Do not have throw rugs and other things on the  floor that can make you trip. What can I do in the bedroom? Use night lights. Make sure that you have a light by your bed that is easy to reach. Do not use any sheets or blankets that are too big for your bed. They should not hang down onto the floor. Have a firm chair that has side arms. You can use this for support while you get dressed. Do not have throw rugs and other things on the floor that can make you trip. What can I do in the kitchen? Clean up any spills right away. Avoid walking on wet floors. Keep items that you use a lot in easy-to-reach places. If you need to reach something above you, use a strong step stool that has a grab bar. Keep electrical cords out of the way. Do not use floor polish or wax that makes floors slippery. If you must use wax, use non-skid floor wax. Do not have throw rugs and other things on the floor that can make you trip. What can I do with my stairs? Do not leave any items on the stairs. Make sure that there are handrails on both sides of the stairs and use them. Fix handrails that are broken or loose. Make sure that handrails are as long as the stairways. Check any carpeting to make sure that it is firmly attached to the stairs. Fix any carpet that is loose or worn. Avoid  having throw rugs at the top or bottom of the stairs. If you do have throw rugs, attach them to the floor with carpet tape. Make sure that you have a light switch at the top of the stairs and the bottom of the stairs. If you do not have them, ask someone to add them for you. What else can I do to help prevent falls? Wear shoes that: Do not have high heels. Have rubber bottoms. Are comfortable and fit you well. Are closed at the toe. Do not wear sandals. If you use a stepladder: Make sure that it is fully opened. Do not climb a closed stepladder. Make sure that both sides of the stepladder are locked into place. Ask someone to hold it for you, if possible. Clearly mark and make  sure that you can see: Any grab bars or handrails. First and last steps. Where the edge of each step is. Use tools that help you move around (mobility aids) if they are needed. These include: Canes. Walkers. Scooters. Crutches. Turn on the lights when you go into a dark area. Replace any light bulbs as soon as they burn out. Set up your furniture so you have a clear path. Avoid moving your furniture around. If any of your floors are uneven, fix them. If there are any pets around you, be aware of where they are. Review your medicines with your doctor. Some medicines can make you feel dizzy. This can increase your chance of falling. Ask your doctor what other things that you can do to help prevent falls. This information is not intended to replace advice given to you by your health care provider. Make sure you discuss any questions you have with your health care provider. Document Released: 11/01/2008 Document Revised: 06/13/2015 Document Reviewed: 02/09/2014 Elsevier Interactive Patient Education  2017 [employer].

## 2021-08-28 NOTE — Progress Notes (Signed)
Virtual Visit via Telephone Note  I connected with  [name] on 08/28/21 at  3:00 PM EDT by telephone and verified that I am speaking with the correct person using two identifiers.  Location: Patient: home Provider: [place] Persons participating in the virtual visit: [place]   I discussed the limitations, risks, security and privacy concerns of performing an evaluation and management service by telephone and the availability of in person appointments. The patient expressed understanding and agreed to proceed.  Interactive audio and video telecommunications were attempted between this nurse and patient, however failed, due to patient having technical difficulties OR patient did not have access to video capability.  We continued and completed visit with audio only.  Some vital signs may be absent or patient reported.   Eduardo David, LPN  Subjective:   Eduardo Pierce is a 71 y.o. male who presents for Medicare Annual/Subsequent preventive examination.  Review of Systems     Cardiac Risk Factors include: advanced age (>23mn, >>35women);male gender;diabetes mellitus     Objective:    There were no vitals filed for this visit. There is no height or weight on file to calculate BMI.     08/28/2021    3:02 PM 08/22/2020    8:22 AM 07/11/2018    8:17 AM 06/17/2017   12:51 PM 03/13/2011   11:00 PM  Advanced Directives  Does Patient Have a Medical Advance Directive? No Yes Yes Yes Patient has advance directive, copy not in chart  Type of Advance Directive  [place] will [place] will HHat[place]in Chart?  No - copy requested No - copy requested No - copy requested Copy requested from family  Would patient like information on creating a medical advance directive? No - Patient declined      Pre-existing  out of facility DNR order (yellow form or pink MOST form)     No    Current Medications (verified) Outpatient Encounter Medications as of 08/28/2021  Medication Sig   aspirin EC 81 MG tablet Take 1 tablet (81 mg total) by mouth every other day.   Cyanocobalamin (VITAMIN B-12 CR) 1000 MCG TBCR Take 1 tablet by mouth daily.   Empagliflozin-metFORMIN HCl (SYNJARDY) 05-998 MG TABS Take 1 tablet by mouth 2 (two) times daily.   FREESTYLE LITE test strip TEST BLOOD SUGAR EVERY DAY AND AS NEEDED   glipiZIDE (GLUCOTROL) 5 MG tablet Take 1 tablet (5 mg total) by mouth 2 (two) times daily before a meal.   Lancets MISC Use to check blood sugar daily   lisinopril (ZESTRIL) 2.5 MG tablet Take 1 tablet (2.5 mg total) by mouth daily.   pioglitazone (ACTOS) 15 MG tablet Take 1 tablet (15 mg total) by mouth daily.   simvastatin (ZOCOR) 20 MG tablet Take 1 tablet (20 mg total) by mouth daily at 6 PM.   vitamin C (ASCORBIC ACID) 500 MG tablet Take 500 mg by mouth daily.   No facility-administered encounter medications on file as of 08/28/2021.    Allergies (verified) Penicillins   History: Past Medical History:  Diagnosis Date   Diabetes mellitus type II 10/20/1995   completed DSME   Hyperlipidemia 02/20/1996   Leukopenia 03/2011   mild, normal periph smear, normal B12 and folate   Past Surgical History:  Procedure Laterality Date   COLONOSCOPY  05/2011   prolapse  type polyp, rpt due 10 yrs   hospitalization  02/2011   vasovagal presyncope, some NSVT on tele   nuclear stress echo  02/2011   no ischemia/infarct, normal LV wall motion, EF 68%   Family History  Problem Relation Age of Onset   Hypertension Mother    Diabetes Mother    Stroke Mother    Kidney disease Mother        failure-dialysis   Heart disease Mother        MI   Heart disease Other        CHF, CABG   Cancer Other        lung   Colon cancer Neg Hx    Social History   Socioeconomic History   Marital status: Married     Spouse name: Not on file   Number of children: 3   Years of education: Not on file   Highest education level: Not on file  Occupational History   Occupation: [employer]    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  Sugar free diet.   Social Determinants of Health   Financial Resource Strain: Low Risk  (08/28/2021)   Overall Financial Resource Strain (CARDIA)    Difficulty of Paying Living Expenses: Not hard at all  Food Insecurity: No Food Insecurity (08/28/2021)   Hunger Vital Sign    Worried About Running Out of Food in the Last Year: Never true    Ran Out of Food in the Last Year: Never true  Transportation Needs: No Transportation Needs (08/28/2021)   PRAPARE - Hydrologist (Medical): No    Lack of Transportation (Non-Medical): No  Physical Activity: Sufficiently Active (08/28/2021)   Exercise Vital Sign    Days of Exercise per Week: 3 days    Minutes of Exercise per Session: 60 min  Stress: No Stress Concern Present (08/28/2021)   [place]    Feeling of Stress : Not at all  Social Connections: Moderately Integrated (08/28/2021)   Social Connection and Isolation Panel [NHANES]    Frequency of Communication with Friends and Family: More than three times a week    Frequency of  Social Gatherings with Friends and Family: More than three times a week    Attends Religious Services: More than 4 times per year    Active Member of [employer] or Organizations: No    Attends Archivist Meetings: Never    Marital Status: Married    Tobacco Counseling Counseling given: Not Answered   Clinical Intake:  Pre-visit preparation completed: Yes  Pain : No/denies pain     Nutritional Risks: None Diabetes: Yes CBG  done?: No Did pt. bring in CBG monitor from home?: No  How often do you need to have someone help you when you read instructions, pamphlets, or other written materials from your doctor or pharmacy?: 1 - Never  Diabetic?yes Nutrition Risk Assessment:  Has the patient had any N/V/D within the last 2 months?  No  Does the patient have any non-healing wounds?  No  Has the patient had any unintentional weight loss or weight gain?  No   Diabetes:  Is the patient diabetic?  Yes  If diabetic, was a CBG obtained today?  No  Did the patient bring in their glucometer from home?  No  How often do you monitor your CBG's? Every morning.   Financial Strains and Diabetes Management:  Are you having any financial strains with the device, your supplies or your medication? No .  Does the patient want to be seen by Chronic Care Management for management of their diabetes?  No  Would the patient like to be referred to a Nutritionist or for Diabetic Management?  No   Diabetic Exams:  Diabetic Eye Exam: Completed 02/20/20. Overdue for diabetic eye exam. Pt has been advised about the importance in completing this exam.  Diabetic Foot Exam: Completed 03/14/21. Pt has been advised about the importance in completing this exam.   Interpreter Needed?: No  Information entered by :: Kirke Shaggy, LPN   Activities of Daily Living    08/28/2021    3:03 PM  In your present state of health, do you have any difficulty performing the following activities:  Hearing? 0   Vision? 0  Difficulty concentrating or making decisions? 0  Walking or climbing stairs? 0  Dressing or bathing? 0  Doing errands, shopping? 0  Preparing Food and eating ? N  Using the Toilet? N  In the past six months, have you accidently leaked urine? N  Do you have problems with loss of bowel control? N  Managing your Medications? N  Managing your Finances? N  Housekeeping or managing your Housekeeping? N    Patient Care Team: Ria Bush, MD as PCP - General (Family Medicine)  Indicate any recent Medical Services you may have received from other than [facility] providers in the past year (date may be approximate).     Assessment:   This is a routine wellness examination for [place].  Hearing/Vision screen Hearing Screening - Comments:: No aids Vision Screening - Comments:: Readers- Dr.Groat  Dietary issues and exercise activities discussed: Current Exercise Habits: Structured exercise class, Type of exercise: strength training/weights;stretching, Time (Minutes): 60, Frequency (Times/Week): 3, Weekly Exercise (Minutes/Week): 180, Intensity: Mild   Goals Addressed             This Visit's Progress    DIET - EAT MORE FRUITS AND VEGETABLES         Depression Screen    08/28/2021    3:00 PM 08/22/2020    8:23 AM 08/03/2019    9:35 AM 07/11/2018    8:07 AM 06/17/2017   12:50 PM 05/20/2016    8:39 AM 05/09/2015   11:56 AM  PHQ 2/9 Scores  PHQ - 2 Score 0 0 0 0 0 0 0  PHQ- 9 Score 0 0  0 0      Fall Risk    08/28/2021    3:02 PM 08/22/2020    8:23 AM 08/03/2019    9:35 AM 07/11/2018    8:07 AM 06/17/2017   12:50 PM  Fall Risk  Falls in the past year? 0 0 0 0 No  Number falls in past yr: 0 0     Injury with Fall? 0 0     Risk for fall due to : No Fall Risks Medication side effect     Follow up Falls evaluation completed Falls evaluation completed;Falls prevention discussed       FALL RISK PREVENTION PERTAINING TO THE HOME:  Any stairs in or around the home? Yes   If so, are there any without handrails? No  Home free of loose throw rugs in walkways, pet beds, electrical cords, etc? Yes  Adequate lighting in your home to reduce risk of falls? Yes   ASSISTIVE DEVICES UTILIZED TO PREVENT FALLS:  Life alert? Yes  Use of a cane, walker or w/c? No  Grab bars in the bathroom? No  Shower chair or bench in shower? No  Elevated toilet seat or a handicapped toilet? No    Cognitive Function:    08/22/2020    8:27 AM 07/11/2018    8:17 AM 06/17/2017   12:45 PM  MMSE - Mini Mental State Exam  Not completed: Refused    Orientation to time  5 5  Orientation to Place  5 5  Registration  3 3  Attention/ Calculation  0 0  Recall  3 3  Language- name 2 objects  0 0  Language- repeat  1 1  Language- follow 3 step command  0 3  Language- read & follow direction  0 0  Write a sentence  0 0  Copy design  0 0  Total score  17 20        08/28/2021    3:04 PM  6CIT Screen  What Year? 0 points  What month? 0 points  What time? 0 points  Count back from 20 0 points  Months in reverse 0 points  Repeat phrase 0 points  Total Score 0 points    Immunizations Immunization History  Administered Date(s) Administered   Influenza Whole 12/02/2004   Influenza, High Dose Seasonal PF 01/24/2019   PFIZER(Purple Top)SARS-COV-2 Vaccination 02/24/2019, 03/21/2019, 10/10/2019   Pfizer Covid-19 Vaccine Bivalent Booster 22yr & up 11/29/2020   Pneumococcal Conjugate-13 06/22/2017   Pneumococcal Polysaccharide-23 07/19/2018   Td 11/19/2004   Zoster Recombinat (Shingrix) 09/13/2018, 12/12/2018   Zoster, Live 10/18/2012    TDAP status: Due, Education has been provided regarding the importance of this vaccine. Advised may receive this vaccine at local pharmacy or Health Dept. Aware to provide a copy of the vaccination record if obtained from local pharmacy or Health Dept. Verbalized acceptance and understanding.  Flu Vaccine status: Declined, Education has been  provided regarding the importance of this vaccine but patient still declined. Advised may receive this vaccine at local pharmacy or Health Dept. Aware to provide a copy of the vaccination record if obtained from local pharmacy or Health Dept. Verbalized acceptance and understanding.  Pneumococcal vaccine status: Up to date  Covid-19 vaccine status: Completed vaccines  Qualifies for Shingles Vaccine? Yes   Zostavax completed Yes   Shingrix Completed?: Yes  Screening Tests Health Maintenance  Topic Date Due   OPHTHALMOLOGY EXAM  02/19/2021   COVID-19 Vaccine (5 - Pfizer series) 03/29/2021   COLONOSCOPY (Pts 45-43yrInsurance coverage will need to be confirmed)  06/07/2021   INFLUENZA VACCINE  08/19/2021   TETANUS/TDAP  08/23/2023 (Originally 11/20/2014)   HEMOGLOBIN A1C  09/11/2021   FOOT EXAM  03/14/2022   Pneumonia Vaccine 65+ Years  old  Completed   Hepatitis C Screening  Completed   Zoster Vaccines- Shingrix  Completed   HPV VACCINES  Aged Out    Health Maintenance  Health Maintenance Due  Topic Date Due   OPHTHALMOLOGY EXAM  02/19/2021   COVID-19 Vaccine (5 - Pfizer series) 03/29/2021   COLONOSCOPY (Pts 45-60yr Insurance coverage will need to be confirmed)  06/07/2021   INFLUENZA VACCINE  08/19/2021    Colorectal cancer screening: Type of screening: Colonoscopy. Completed 06/08/11. Repeat every 10 years- has appt scheduled at end of September  Lung Cancer Screening: (Low Dose CT Chest recommended if Age 72-80years, 30 pack-year currently smoking OR have quit w/in 15years.) does not qualify.    Additional Screening:  Hepatitis C Screening: does qualify; Completed 04/25/15  Vision Screening: Recommended annual ophthalmology exams for early detection of glaucoma and other disorders of the eye. Is the patient up to date with their annual eye exam?  Yes  Who is the provider or what is the name of the office in which the patient attends annual eye exams? Dr.Groat If pt is  not established with a provider, would they like to be referred to a provider to establish care? No .   Dental Screening: Recommended annual dental exams for proper oral hygiene  Community Resource Referral / Chronic Care Management: CRR required this visit?  No   CCM required this visit?  No      Plan:     I have personally reviewed and noted the following in the patient's chart:   Medical and social history Use of alcohol, tobacco or illicit drugs  Current medications and supplements including opioid prescriptions. Patient is not currently taking opioid prescriptions. Functional ability and status Nutritional status Physical activity Advanced directives List of other physicians Hospitalizations, surgeries, and ER visits in previous 12 months Vitals Screenings to include cognitive, depression, and falls Referrals and appointments  In addition, I have reviewed and discussed with patient certain preventive protocols, quality metrics, and best practice recommendations. A written personalized care plan for preventive services as well as general preventive health recommendations were provided to patient.     LDionisio David LPN   [id number]  Nurse Notes: none

## 2021-09-02 DIAGNOSIS — L570 Actinic keratosis: Secondary | ICD-10-CM | POA: Diagnosis not present

## 2021-09-05 ENCOUNTER — Telehealth: Payer: Self-pay | Admitting: *Deleted

## 2021-09-05 NOTE — Telephone Encounter (Signed)
Attempted to contact patient to reschedule his colonoscopy with another provider.  Unable to leave a message.  Sent letter asking patient to call and schedule with another provider.  Many appts avail 10/15/21.

## 2021-09-11 NOTE — Telephone Encounter (Signed)
Attempted to contact pt. To reschedule with different provider,message left on mobile phone at [phone],unable to leave message at home # memory full.

## 2021-09-18 ENCOUNTER — Other Ambulatory Visit: Payer: Self-pay | Admitting: Family Medicine

## 2021-09-18 DIAGNOSIS — E118 Type 2 diabetes mellitus with unspecified complications: Secondary | ICD-10-CM

## 2021-09-19 ENCOUNTER — Encounter: Payer: Self-pay | Admitting: Family Medicine

## 2021-09-19 HISTORY — PX: COLONOSCOPY: SHX174

## 2021-09-22 ENCOUNTER — Other Ambulatory Visit: Payer: Self-pay | Admitting: Family Medicine

## 2021-09-22 DIAGNOSIS — E1169 Type 2 diabetes mellitus with other specified complication: Secondary | ICD-10-CM

## 2021-09-22 DIAGNOSIS — D72819 Decreased white blood cell count, unspecified: Secondary | ICD-10-CM

## 2021-09-22 DIAGNOSIS — E118 Type 2 diabetes mellitus with unspecified complications: Secondary | ICD-10-CM

## 2021-09-22 DIAGNOSIS — Z125 Encounter for screening for malignant neoplasm of prostate: Secondary | ICD-10-CM

## 2021-09-24 ENCOUNTER — Ambulatory Visit (AMBULATORY_SURGERY_CENTER): Payer: Self-pay | Admitting: *Deleted

## 2021-09-24 ENCOUNTER — Other Ambulatory Visit: Payer: Medicare HMO

## 2021-09-24 VITALS — Ht 68.0 in | Wt 148.0 lb

## 2021-09-24 DIAGNOSIS — Z1211 Encounter for screening for malignant neoplasm of colon: Secondary | ICD-10-CM

## 2021-09-24 MED ORDER — NA SULFATE-K SULFATE-MG SULF 17.5-3.13-1.6 GM/177ML PO SOLN: 1.0000 | Freq: Once | ORAL | 0 refills | Status: AC

## 2021-09-24 NOTE — Progress Notes (Signed)
No egg or soy allergy known to patient  No issues known to pt with past sedation with any surgeries or procedures Patient denies ever being told they had issues or difficulty with intubation  No FH of Malignant Hyperthermia Pt is not on diet pills Pt is not on home 02  Pt is not on blood thinners  Pt denies issues with constipation  No A fib or A flutter Have any cardiac testing pending--NO Pt instructed to use [email or web address].com or GoodRx for a price reduction on prep   

## 2021-09-25 ENCOUNTER — Other Ambulatory Visit (INDEPENDENT_AMBULATORY_CARE_PROVIDER_SITE_OTHER): Payer: Medicare HMO

## 2021-09-25 DIAGNOSIS — E1169 Type 2 diabetes mellitus with other specified complication: Secondary | ICD-10-CM | POA: Diagnosis not present

## 2021-09-25 DIAGNOSIS — Z125 Encounter for screening for malignant neoplasm of prostate: Secondary | ICD-10-CM | POA: Diagnosis not present

## 2021-09-25 DIAGNOSIS — E785 Hyperlipidemia, unspecified: Secondary | ICD-10-CM | POA: Diagnosis not present

## 2021-09-25 DIAGNOSIS — E118 Type 2 diabetes mellitus with unspecified complications: Secondary | ICD-10-CM | POA: Diagnosis not present

## 2021-09-25 DIAGNOSIS — D72819 Decreased white blood cell count, unspecified: Secondary | ICD-10-CM

## 2021-09-25 LAB — COMPREHENSIVE METABOLIC PANEL
ALT: 14 U/L (ref 0–53)
AST: 15 U/L (ref 0–37)
Albumin: 4.2 g/dL (ref 3.5–5.2)
Alkaline Phosphatase: 45 U/L (ref 39–117)
BUN: 24 mg/dL — ABNORMAL HIGH (ref 6–23)
CO2: 25 mEq/L (ref 19–32)
Calcium: 9 mg/dL (ref 8.4–10.5)
Chloride: 105 mEq/L (ref 96–112)
Creatinine, Ser: 1.23 mg/dL (ref 0.40–1.50)
GFR: 58.97 mL/min — ABNORMAL LOW (ref >60.00)
Glucose, Bld: 131 mg/dL — ABNORMAL HIGH (ref 70–99)
Potassium: 4.1 mEq/L (ref 3.5–5.1)
Sodium: 139 mEq/L (ref 135–145)
Total Bilirubin: 0.7 mg/dL (ref 0.2–1.2)
Total Protein: 6.4 g/dL (ref 6.0–8.3)

## 2021-09-25 LAB — LIPID PANEL
Cholesterol: 124 mg/dL (ref 0–200)
HDL: 44.8 mg/dL (ref >39.00)
LDL Cholesterol: 64 mg/dL (ref 0–99)
NonHDL: 78.91
Total CHOL/HDL Ratio: 3
Triglycerides: 76 mg/dL (ref 0.0–149.0)
VLDL: 15.2 mg/dL (ref 0.0–40.0)

## 2021-09-25 LAB — MICROALBUMIN / CREATININE URINE RATIO
Creatinine,U: 85.9 mg/dL
Microalb Creat Ratio: 1.1 mg/g (ref 0.0–30.0)
Microalb, Ur: 0.9 mg/dL (ref 0.0–1.9)

## 2021-09-25 LAB — CBC WITH DIFFERENTIAL/PLATELET
Basophils Absolute: 0.1 10*3/uL (ref 0.0–0.1)
Basophils Relative: 1.4 % (ref 0.0–3.0)
Eosinophils Absolute: 0.2 10*3/uL (ref 0.0–0.7)
Eosinophils Relative: 5.7 % — ABNORMAL HIGH (ref 0.0–5.0)
HCT: 42.4 % (ref 39.0–52.0)
Hemoglobin: 14.4 g/dL (ref 13.0–17.0)
Lymphocytes Relative: 19.2 % (ref 12.0–46.0)
Lymphs Abs: 0.8 10*3/uL (ref 0.7–4.0)
MCHC: 34 g/dL (ref 30.0–36.0)
MCV: 97.2 fl (ref 78.0–100.0)
Monocytes Absolute: 0.4 10*3/uL (ref 0.1–1.0)
Monocytes Relative: 9.6 % (ref 3.0–12.0)
Neutro Abs: 2.5 10*3/uL (ref 1.4–7.7)
Neutrophils Relative %: 64.1 % (ref 43.0–77.0)
Platelets: 165 10*3/uL (ref 150.0–400.0)
RBC: 4.37 Mil/uL (ref 4.22–5.81)
RDW: 13.6 % (ref 11.5–15.5)
WBC: 4 10*3/uL (ref 4.0–10.5)

## 2021-09-25 LAB — HEMOGLOBIN A1C: Hgb A1c MFr Bld: 7.1 % — ABNORMAL HIGH (ref 4.6–6.5)

## 2021-09-25 LAB — PSA: PSA: 1.08 ng/mL (ref 0.10–4.00)

## 2021-09-30 ENCOUNTER — Encounter: Payer: Self-pay | Admitting: Gastroenterology

## 2021-10-01 ENCOUNTER — Encounter: Payer: Self-pay | Admitting: Family Medicine

## 2021-10-01 ENCOUNTER — Ambulatory Visit (INDEPENDENT_AMBULATORY_CARE_PROVIDER_SITE_OTHER): Payer: Medicare HMO | Admitting: Family Medicine

## 2021-10-01 VITALS — BP 122/66 | HR 72 | Temp 97.3°F | Ht 67.0 in | Wt 147.2 lb

## 2021-10-01 DIAGNOSIS — Z0000 Encounter for general adult medical examination without abnormal findings: Secondary | ICD-10-CM

## 2021-10-01 DIAGNOSIS — E118 Type 2 diabetes mellitus with unspecified complications: Secondary | ICD-10-CM | POA: Diagnosis not present

## 2021-10-01 DIAGNOSIS — Z7189 Other specified counseling: Secondary | ICD-10-CM

## 2021-10-01 DIAGNOSIS — E785 Hyperlipidemia, unspecified: Secondary | ICD-10-CM

## 2021-10-01 DIAGNOSIS — D72819 Decreased white blood cell count, unspecified: Secondary | ICD-10-CM | POA: Diagnosis not present

## 2021-10-01 DIAGNOSIS — E1169 Type 2 diabetes mellitus with other specified complication: Secondary | ICD-10-CM

## 2021-10-01 MED ORDER — PIOGLITAZONE HCL 15 MG PO TABS: 15.0000 mg | ORAL_TABLET | Freq: Every day | ORAL | 3 refills | Status: DC

## 2021-10-01 MED ORDER — SYNJARDY 5-1000 MG PO TABS: 1.0000 | ORAL_TABLET | Freq: Two times a day (BID) | ORAL | 11 refills | Status: DC

## 2021-10-01 MED ORDER — SIMVASTATIN 20 MG PO TABS: 20.0000 mg | ORAL_TABLET | Freq: Every day | ORAL | 3 refills | Status: DC

## 2021-10-01 MED ORDER — GLIPIZIDE 5 MG PO TABS
5.0000 mg | ORAL_TABLET | Freq: Two times a day (BID) | ORAL | 3 refills | Status: DC
Start: 2021-10-01 — End: 2022-10-01

## 2021-10-01 MED ORDER — LISINOPRIL 2.5 MG PO TABS: 2.5000 mg | ORAL_TABLET | Freq: Every day | ORAL | 3 refills | Status: DC

## 2021-10-01 NOTE — Assessment & Plan Note (Signed)
Preventative protocols reviewed and updated unless pt declined. Discussed healthy diet and lifestyle.  

## 2021-10-01 NOTE — Assessment & Plan Note (Signed)
Advanced directive discussion - has at home. HCPOA would be wife. Full code. Will check at home and bring [name] copy.

## 2021-10-01 NOTE — Assessment & Plan Note (Signed)
Chronic, mild, stable.

## 2021-10-01 NOTE — Assessment & Plan Note (Signed)
Chronic, stable on current regimen - continue. 

## 2021-10-01 NOTE — Assessment & Plan Note (Signed)
Chronic, well controlled on current regimen - continue. The ASCVD Risk score (Arnett DK, et al., 2019) failed to calculate for the following reasons:   The valid total cholesterol range is 130 to 320 mg/dL

## 2021-10-01 NOTE — Progress Notes (Signed)
Patient ID: Eduardo Pierce, male    DOB: [birth year], 71 y.o.   MRN: [id number]  This visit was conducted in person.  BP 122/66   Pulse 72   Temp (!) 97.3 F (36.3 C) (Temporal)   Ht '5\' 7"'$  (1.702 m)   Wt 147 lb 4 oz (66.8 kg)   SpO2 98%   BMI 23.06 kg/m    CC: CPE Subjective:   Eduardo Pierce is a 71 y.o. male presenting on 10/01/2021 for Annual Exam (MCR prt 2. )   Saw health advisor 08/2021 for medicare wellness visit. Note reviewed.    No results found.  Flowsheet Row Clinical Support from 08/28/2021 in [facility] [place]  PHQ-2 Total Score 0          08/28/2021    3:02 PM 08/22/2020    8:23 AM 08/03/2019    9:35 AM 07/11/2018    8:07 AM 06/17/2017   12:50 PM  Fall Risk   Falls in the past year? 0 0 0 0 No  Number falls in past yr: 0 0     Injury with Fall? 0 0     Risk for fall due to : No Fall Risks Medication side effect     Follow up Falls evaluation completed Falls evaluation completed;Falls prevention discussed      Preventative: Colon screening - 06/08/2011. Prolapse type polyp, rpt due 10 yrs Ardis Hughs) - scheduled for end of the month Candis Schatz).  Prostate - yearly PSA check. Nocturia x1, strong stream.  Lung cancer screening - quit smoking 1988  Flu shot - yearly  COVID vaccine - Eduardo Pierce 02/2019, 03/2019, booster 09/2019, bivalent 11/2020 Prevnar-13 06/2017. Pneumovax-23 06/2018.  Td - 2006  Zostavax 09/2012  Shingrix - 08/2018, 11/2018  Advanced directive discussion - has at home. HCPOA would be wife. Full code. Will check at home and bring [name] copy.  Seat belt use discussed  Sunscreen use discussed. No changing moles on skin. Sees derm yearly ([facility]). S/p blue light therapy years ago.  Sleep - averaging 7-8 hours/night Ex smoker. Quit 1988. Wife quit smoking as well Alcohol - none  Dentist - Q53mo Eye exam - Dr GKaty Fitch Bowel - no constipation Bladder - no incontinence  "[name]"   Married lives wife 3 children Retired  2014 Activity: Exercises at [facility] 2-3 times per week - treadmill Diet: good water, fruits/vegetables daily. Sugar free diet      Relevant past medical, surgical, family and social history reviewed and updated as indicated. Interim medical history since our last visit reviewed. Allergies and medications reviewed and updated. Outpatient Medications Prior to Visit  Medication Sig Dispense Refill   aspirin EC 81 MG tablet Take 1 tablet (81 mg total) by mouth every other day.     Cyanocobalamin (VITAMIN B-12 CR) 1000 MCG TBCR Take 1 tablet by mouth daily.     FREESTYLE LITE test strip TEST BLOOD SUGAR EVERY DAY AND AS NEEDED 300 each 3   Lancets MISC Use to check blood sugar daily     OVER THE COUNTER MEDICATION daily. [facility] DROPS ONCE A DAY-ONE DROP EACH EYE     vitamin C (ASCORBIC ACID) 500 MG tablet Take 500 mg by mouth daily.     Empagliflozin-metFORMIN HCl (SYNJARDY) 05-998 MG TABS Take 1 tablet by mouth 2 (two) times daily. 60 tablet 7   glipiZIDE (GLUCOTROL) 5 MG tablet Take 1 tablet (5 mg total) by mouth 2 (two)  times daily before a meal. 180 tablet 3   lisinopril (ZESTRIL) 2.5 MG tablet Take 1 tablet (2.5 mg total) by mouth daily. 90 tablet 3   pioglitazone (ACTOS) 15 MG tablet TAKE 1 TABLET (15 MG TOTAL) BY MOUTH DAILY. 90 tablet 0   simvastatin (ZOCOR) 20 MG tablet Take 1 tablet (20 mg total) by mouth daily at 6 PM. 90 tablet 3   No facility-administered medications prior to visit.     Per HPI unless specifically indicated in ROS section below Review of Systems  Constitutional:  Negative for activity change, appetite change, chills, fatigue, fever and unexpected weight change.  HENT:  Negative for hearing loss.   Eyes:  Negative for visual disturbance.  Respiratory:  Negative for cough, chest tightness, shortness of breath and wheezing.   Cardiovascular:  Negative for chest pain, palpitations and leg swelling.  Gastrointestinal:  Negative for abdominal distention, abdominal pain,  blood in stool, constipation, diarrhea, nausea and vomiting.  Genitourinary:  Negative for difficulty urinating and hematuria.  Musculoskeletal:  Negative for arthralgias, myalgias and neck pain.  Skin:  Negative for rash.  Neurological:  Negative for dizziness, seizures, syncope and headaches.  Hematological:  Negative for adenopathy. Does not bruise/bleed easily.  Psychiatric/Behavioral:  Negative for dysphoric mood. The patient is not nervous/anxious.     Objective:  BP 122/66   Pulse 72   Temp (!) 97.3 F (36.3 C) (Temporal)   Ht '5\' 7"'$  (1.702 m)   Wt 147 lb 4 oz (66.8 kg)   SpO2 98%   BMI 23.06 kg/m   Wt Readings from Last 3 Encounters:  10/01/21 147 lb 4 oz (66.8 kg)  09/24/21 148 lb (67.1 kg)  08/28/21 151 lb (68.5 kg)      Physical Exam Vitals and nursing note reviewed.  Constitutional:      General: He is not in acute distress.    Appearance: Normal appearance. He is well-developed. He is not ill-appearing.  HENT:     Head: Normocephalic and atraumatic.     Right Ear: Hearing, tympanic membrane, ear canal and external ear normal.     Left Ear: Hearing, tympanic membrane, ear canal and external ear normal.  Eyes:     General: No scleral icterus.    Extraocular Movements: Extraocular movements intact.     Conjunctiva/sclera: Conjunctivae normal.     Pupils: Pupils are equal, round, and reactive to light.  Neck:     Thyroid: No thyroid mass or thyromegaly.     Vascular: No carotid bruit.  Cardiovascular:     Rate and Rhythm: Normal rate and regular rhythm.     Pulses: Normal pulses.          Radial pulses are 2+ on the right side and 2+ on the left side.     Heart sounds: Normal heart sounds. No murmur heard. Pulmonary:     Effort: Pulmonary effort is normal. No respiratory distress.     Breath sounds: Normal breath sounds. No wheezing, rhonchi or rales.  Abdominal:     General: Bowel sounds are normal. There is no distension.     Palpations: Abdomen is soft.  There is no mass.     Tenderness: There is no abdominal tenderness. There is no guarding or rebound.     Hernia: No hernia is present.  Musculoskeletal:        General: Normal range of motion.     Cervical back: Normal range of motion and neck supple.  Right lower leg: No edema.     Left lower leg: No edema.     Comments: Bilateral hand contractures R>L, 3rd carpals  Lymphadenopathy:     Cervical: No cervical adenopathy.  Skin:    General: Skin is warm and dry.     Findings: No rash.  Neurological:     General: No focal deficit present.     Mental Status: He is alert and oriented to person, place, and time.  Psychiatric:        Mood and Affect: Mood normal.        Behavior: Behavior normal.        Thought Content: Thought content normal.        Judgment: Judgment normal.       Results for orders placed or performed in visit on 09/25/21  PSA  Result Value Ref Range   PSA 1.08 0.10 - 4.00 ng/mL  CBC with Differential/Platelet  Result Value Ref Range   WBC 4.0 4.0 - 10.5 K/uL   RBC 4.37 4.22 - 5.81 Mil/uL   Hemoglobin 14.4 13.0 - 17.0 g/dL   HCT 42.4 39.0 - 52.0 %   MCV 97.2 78.0 - 100.0 fl   MCHC 34.0 30.0 - 36.0 g/dL   RDW 13.6 11.5 - 15.5 %   Platelets 165.0 150.0 - 400.0 K/uL   Neutrophils Relative % 64.1 43.0 - 77.0 %   Lymphocytes Relative 19.2 12.0 - 46.0 %   Monocytes Relative 9.6 3.0 - 12.0 %   Eosinophils Relative 5.7 (H) 0.0 - 5.0 %   Basophils Relative 1.4 0.0 - 3.0 %   Neutro Abs 2.5 1.4 - 7.7 K/uL   Lymphs Abs 0.8 0.7 - 4.0 K/uL   Monocytes Absolute 0.4 0.1 - 1.0 K/uL   Eosinophils Absolute 0.2 0.0 - 0.7 K/uL   Basophils Absolute 0.1 0.0 - 0.1 K/uL  Microalbumin / creatinine urine ratio  Result Value Ref Range   Microalb, Ur 0.9 0.0 - 1.9 mg/dL   Creatinine,U 85.9 mg/dL   Microalb Creat Ratio 1.1 0.0 - 30.0 mg/g  Hemoglobin A1c  Result Value Ref Range   Hgb A1c MFr Bld 7.1 (H) 4.6 - 6.5 %  Comprehensive metabolic panel  Result Value Ref Range    Sodium 139 135 - 145 mEq/L   Potassium 4.1 3.5 - 5.1 mEq/L   Chloride 105 96 - 112 mEq/L   CO2 25 19 - 32 mEq/L   Glucose, Bld 131 (H) 70 - 99 mg/dL   BUN 24 (H) 6 - 23 mg/dL   Creatinine, Ser 1.23 0.40 - 1.50 mg/dL   Total Bilirubin 0.7 0.2 - 1.2 mg/dL   Alkaline Phosphatase 45 39 - 117 U/L   AST 15 0 - 37 U/L   ALT 14 0 - 53 U/L   Total Protein 6.4 6.0 - 8.3 g/dL   Albumin 4.2 3.5 - 5.2 g/dL   GFR 58.97 (L) >60.00 mL/min   Calcium 9.0 8.4 - 10.5 mg/dL  Lipid panel  Result Value Ref Range   Cholesterol 124 0 - 200 mg/dL   Triglycerides 76.0 0.0 - 149.0 mg/dL   HDL 44.80 >39.00 mg/dL   VLDL 15.2 0.0 - 40.0 mg/dL   LDL Cholesterol 64 0 - 99 mg/dL   Total CHOL/HDL Ratio 3    NonHDL 78.91     Assessment & Plan:   Problem List Items Addressed This Visit     Advanced care planning/counseling discussion (Chronic)    Advanced directive  discussion - has at home. HCPOA would be wife. Full code. Will check at home and bring [name] copy.       Health maintenance examination - Primary (Chronic)    Preventative protocols reviewed and updated unless pt declined. Discussed healthy diet and lifestyle.       Controlled diabetes mellitus type 2 with complications (HCC)    Chronic, stable on current regimen - continue.       Relevant Medications   Empagliflozin-metFORMIN HCl (SYNJARDY) 05-998 MG TABS   glipiZIDE (GLUCOTROL) 5 MG tablet   lisinopril (ZESTRIL) 2.5 MG tablet   simvastatin (ZOCOR) 20 MG tablet   pioglitazone (ACTOS) 15 MG tablet   Hyperlipidemia associated with type 2 diabetes mellitus (HCC)    Chronic, well controlled on current regimen - continue. The ASCVD Risk score (Arnett DK, et al., 2019) failed to calculate for the following reasons:   The valid total cholesterol range is 130 to 320 mg/dL       Relevant Medications   Empagliflozin-metFORMIN HCl (SYNJARDY) 05-998 MG TABS   glipiZIDE (GLUCOTROL) 5 MG tablet   lisinopril (ZESTRIL) 2.5 MG tablet   simvastatin  (ZOCOR) 20 MG tablet   pioglitazone (ACTOS) 15 MG tablet   Leukopenia    Chronic, mild, stable.         Meds ordered this encounter  Medications   Empagliflozin-metFORMIN HCl (SYNJARDY) 05-998 MG TABS    Sig: Take 1 tablet by mouth 2 (two) times daily.    Dispense:  60 tablet    Refill:  11   glipiZIDE (GLUCOTROL) 5 MG tablet    Sig: Take 1 tablet (5 mg total) by mouth 2 (two) times daily before a meal.    Dispense:  180 tablet    Refill:  3   lisinopril (ZESTRIL) 2.5 MG tablet    Sig: Take 1 tablet (2.5 mg total) by mouth daily.    Dispense:  90 tablet    Refill:  3   simvastatin (ZOCOR) 20 MG tablet    Sig: Take 1 tablet (20 mg total) by mouth daily at 6 PM.    Dispense:  90 tablet    Refill:  3   pioglitazone (ACTOS) 15 MG tablet    Sig: Take 1 tablet (15 mg total) by mouth daily.    Dispense:  90 tablet    Refill:  3   No orders of the defined types were placed in this encounter.    Patient instructions: You are doing well today  Continue current medicines. Return as needed or in 6 months for diabetes follow up visit.   Follow up plan: Return in about 6 months (around 04/01/2022) for follow up visit.  Ria Bush, MD

## 2021-10-01 NOTE — Patient Instructions (Addendum)
You are doing well today  Continue current medicines. Return as needed or in 6 months for diabetes follow up visit.   Health Maintenance After Age 72 After age 88, you are at a higher risk for certain long-term diseases and infections as well as injuries from falls. Falls are a major cause of broken bones and head injuries in people who are older than age 59. Getting regular preventive care can help to keep you healthy and well. Preventive care includes getting regular testing and making lifestyle changes as recommended by your health care provider. Talk with your health care provider about: Which screenings and tests you should have. A screening is a test that checks for a disease when you have no symptoms. A diet and exercise plan that is right for you. What should I know about screenings and tests to prevent falls? Screening and testing are the best ways to find a health problem early. Early diagnosis and treatment give you the best chance of managing medical conditions that are common after age 72. Certain conditions and lifestyle choices may make you more likely to have a fall. Your health care provider may recommend: Regular vision checks. Poor vision and conditions such as cataracts can make you more likely to have a fall. If you wear glasses, make sure to get your prescription updated if your vision changes. Medicine review. Work with your health care provider to regularly review all of the medicines you are taking, including over-the-counter medicines. Ask your health care provider about any side effects that may make you more likely to have a fall. Tell your health care provider if any medicines that you take make you feel dizzy or sleepy. Strength and balance checks. Your health care provider may recommend certain tests to check your strength and balance while standing, walking, or changing positions. Foot health exam. Foot pain and numbness, as well as not wearing proper footwear, can make  you more likely to have a fall. Screenings, including: Osteoporosis screening. Osteoporosis is a condition that causes the bones to get weaker and break more easily. Blood pressure screening. Blood pressure changes and medicines to control blood pressure can make you feel dizzy. Depression screening. You may be more likely to have a fall if you have a fear of falling, feel depressed, or feel unable to do activities that you used to do. Alcohol use screening. Using too much alcohol can affect your balance and may make you more likely to have a fall. Follow these instructions at home: Lifestyle Do not drink alcohol if: Your health care provider tells you not to drink. If you drink alcohol: Limit how much you have to: 0-1 drink a day for women. 0-2 drinks a day for men. Know how much alcohol is in your drink. In the [place] one drink equals one 12 oz bottle of beer (355 mL), one 5 oz glass of wine (148 mL), or one 1 oz glass of hard liquor (44 mL). Do not use any products that contain nicotine or tobacco. These products include cigarettes, chewing tobacco, and vaping devices, such as e-cigarettes. If you need help quitting, ask your health care provider. Activity  Follow a regular exercise program to stay fit. This will help you maintain your balance. Ask your health care provider what types of exercise are appropriate for you. If you need a cane or walker, use it as recommended by your health care provider. Wear supportive shoes that have nonskid soles. Safety  Remove any tripping hazards, such  as rugs, cords, and clutter. Install safety equipment such as grab bars in bathrooms and safety rails on stairs. Keep rooms and walkways well-lit. General instructions Talk with your health care provider about your risks for falling. Tell your health care provider if: You fall. Be sure to tell your health care provider about all falls, even ones that seem minor. You feel dizzy, tiredness (fatigue),  or off-balance. Take over-the-counter and prescription medicines only as told by your health care provider. These include supplements. Eat a healthy diet and maintain a healthy weight. A healthy diet includes low-fat dairy products, low-fat (lean) meats, and fiber from whole grains, beans, and lots of fruits and vegetables. Stay current with your vaccines. Schedule regular health, dental, and eye exams. Summary Having a healthy lifestyle and getting preventive care can help to protect your health and wellness after age 41. Screening and testing are the best way to find a health problem early and help you avoid having a fall. Early diagnosis and treatment give you the best chance for managing medical conditions that are more common for people who are older than age 68. Falls are a major cause of broken bones and head injuries in people who are older than age 64. Take precautions to prevent a fall at home. Work with your health care provider to learn what changes you can make to improve your health and wellness and to prevent falls. This information is not intended to replace advice given to you by your health care provider. Make sure you discuss any questions you have with your health care provider. Document Revised: 05/27/2020 Document Reviewed: 05/27/2020 Elsevier Patient Education  [place].

## 2021-10-15 ENCOUNTER — Encounter: Payer: Self-pay | Admitting: Gastroenterology

## 2021-10-15 ENCOUNTER — Ambulatory Visit (AMBULATORY_SURGERY_CENTER): Payer: Medicare HMO | Admitting: Gastroenterology

## 2021-10-15 ENCOUNTER — Encounter: Payer: Medicare HMO | Admitting: Gastroenterology

## 2021-10-15 VITALS — BP 104/41 | HR 78 | Temp 98.0°F | Resp 21 | Ht 69.0 in | Wt 148.0 lb

## 2021-10-15 DIAGNOSIS — D125 Benign neoplasm of sigmoid colon: Secondary | ICD-10-CM

## 2021-10-15 DIAGNOSIS — Z1211 Encounter for screening for malignant neoplasm of colon: Secondary | ICD-10-CM | POA: Diagnosis not present

## 2021-10-15 MED ORDER — SODIUM CHLORIDE 0.9 % IV SOLN: 500.0000 mL | Freq: Once | INTRAVENOUS | Status: DC

## 2021-10-15 NOTE — Progress Notes (Signed)
Report to pacu rn. Vss. Care resumed by rn. 

## 2021-10-15 NOTE — Progress Notes (Signed)
Pt's states no medical or surgical changes since previsit or office visit. 

## 2021-10-15 NOTE — Patient Instructions (Signed)
Handout on polyps given to you today   Continue present medications. Resume previous diet. Resume normal activity.  Await pathology results.   YOU HAD AN ENDOSCOPIC PROCEDURE TODAY AT [facility]:   Refer to the procedure report that was given to you for any specific questions about what was found during the examination.  If the procedure report does not answer your questions, please call your gastroenterologist to clarify.  If you requested that your care partner not be given the details of your procedure findings, then the procedure report has been included in a sealed envelope for you to review at your convenience later.  YOU SHOULD EXPECT: Some feelings of bloating in the abdomen. Passage of more gas than usual.  Walking can help get rid of the air that was put into your GI tract during the procedure and reduce the bloating. If you had a lower endoscopy (such as a colonoscopy or flexible sigmoidoscopy) you may notice spotting of blood in your stool or on the toilet paper. If you underwent a bowel prep for your procedure, you may not have a normal bowel movement for a few days.  Please Note:  You might notice some irritation and congestion in your nose or some drainage.  This is from the oxygen used during your procedure.  There is no need for concern and it should clear up in a day or so.  SYMPTOMS TO REPORT IMMEDIATELY:  Following lower endoscopy (colonoscopy or flexible sigmoidoscopy):  Excessive amounts of blood in the stool  Significant tenderness or worsening of abdominal pains  Swelling of the abdomen that is new, acute  Fever of 100F or higher  For urgent or emergent issues, a gastroenterologist can be reached at any hour by calling [phone]. Do not use MyChart messaging for urgent concerns.    DIET:  We do recommend a small meal at first, but then you may proceed to your regular diet.  Drink plenty of fluids but you should avoid alcoholic beverages for 24  hours.  ACTIVITY:  You should plan to take it easy for the rest of today and you should NOT DRIVE or use heavy machinery until tomorrow (because of the sedation medicines used during the test).    FOLLOW UP: Our staff will call the number listed on your records the next business day following your procedure.  We will call around 7:15- 8:00 am to check on you and address any questions or concerns that you may have regarding the information given to you following your procedure. If we do not reach you, we will leave a message.     If any biopsies were taken you will be contacted by phone or by letter within the next 1-3 weeks.  Please call us at [phone] if you have not heard about the biopsies in 3 weeks.    SIGNATURES/CONFIDENTIALITY: You and/or your care partner have signed paperwork which will be entered into your electronic medical record.  These signatures attest to the fact that that the information above on your After Visit Summary has been reviewed and is understood.  Full responsibility of the confidentiality of this discharge information lies with you and/or your care-partner.

## 2021-10-15 NOTE — Op Note (Signed)
[facility] Patient Name: Rumeal Cullipher Procedure Date: 10/15/2021 11:27 AM MRN: [id number] Endoscopist: Argyle. Candis Schatz , MD Age: 71 Referring MD:  Date of Birth: 12/28/1950 Gender: Male Account #: [id number] Procedure:                Colonoscopy Indications:              Screening for colorectal malignant neoplasm (last                            colonoscopy was 10 years ago) Medicines:                Monitored Anesthesia Care Procedure:                Pre-Anesthesia Assessment:                           - Prior to the procedure, a History and Physical                            was performed, and patient medications and                            allergies were reviewed. The patient's tolerance of                            previous anesthesia was also reviewed. The risks                            and benefits of the procedure and the sedation                            options and risks were discussed with the patient.                            All questions were answered, and informed consent                            was obtained. Prior Anticoagulants: The patient has                            taken no previous anticoagulant or antiplatelet                            agents. ASA Grade Assessment: II - A patient with                            mild systemic disease. After reviewing the risks                            and benefits, the patient was deemed in                            satisfactory condition to undergo the procedure.  After obtaining informed consent, the colonoscope                            was passed under direct vision. Throughout the                            procedure, the patient's blood pressure, pulse, and                            oxygen saturations were monitored continuously. The                            Olympus PCF-H190DL [id number]) Colonoscope was                            introduced through the anus  and advanced to the the                            cecum, identified by appendiceal orifice and                            ileocecal valve. The colonoscopy was performed                            without difficulty. The patient tolerated the                            procedure well. The quality of the bowel                            preparation was adequate. The ileocecal valve,                            appendiceal orifice, and rectum were photographed.                            The bowel preparation used was SUPREP via split                            dose instruction. Scope In: 11:35:58 AM Scope Out: 11:51:00 AM Scope Withdrawal Time: 0 hours 10 minutes 25 seconds  Total Procedure Duration: 0 hours 15 minutes 2 seconds  Findings:                 The perianal and digital rectal examinations were                            normal. Pertinent negatives include normal                            sphincter tone and no palpable rectal lesions.                           A 3 mm polyp was found in the sigmoid colon. The  polyp was sessile. The polyp was removed with a                            cold snare. Resection and retrieval were complete.                            Estimated blood loss was minimal.                           The exam was otherwise normal throughout the                            examined colon.                           The retroflexed view of the distal rectum and anal                            verge was normal and showed no anal or rectal                            abnormalities. Complications:            No immediate complications. Estimated Blood Loss:     Estimated blood loss was minimal. Impression:               - One 3 mm polyp in the sigmoid colon, removed with                            a cold snare. Resected and retrieved.                           - The distal rectum and anal verge are normal on                             retroflexion view. Recommendation:           - Patient has a contact number available for                            emergencies. The signs and symptoms of potential                            delayed complications were discussed with the                            patient. Return to normal activities tomorrow.                            Written discharge instructions were provided to the                            patient.                           - Resume previous diet.                           -  Continue present medications.                           - Await pathology results.                           - Repeat colonoscopy (date not yet determined) for                            surveillance based on pathology results. Timberlynn Kizziah E. Candis Schatz, MD 10/15/2021 11:56:52 AM This report has been signed electronically.

## 2021-10-15 NOTE — Progress Notes (Signed)
[facility] History and Physical   Primary Care Physician:  Eduardo Bush, MD   Reason for Procedure:   Colon cancer screening  Plan:    Screening colonoscopy     HPI: Eduardo Pierce is a 71 y.o. male undergoing average risk screening colonoscopy.  He has no family history of colon cancer and no chronic GI symptoms. He had a colonoscopy in 2013 with no precancerous polyps.   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 Authorizing Provider  aspirin EC 81 MG tablet Take 1 tablet (81 mg total) by mouth every other day. 07/19/18  Yes Eduardo Bush, MD  Cyanocobalamin (VITAMIN B-12 CR) 1000 MCG TBCR Take 1 tablet by mouth daily.   Yes [provider]  Empagliflozin-metFORMIN HCl (SYNJARDY) 05-998 MG TABS Take 1 tablet by mouth 2 (two) times daily. 10/01/21  Yes Eduardo Bush, MD  FREESTYLE LITE test strip TEST BLOOD SUGAR EVERY DAY AND AS NEEDED 12/16/15  Yes Eduardo Bush, MD  glipiZIDE (GLUCOTROL) 5 MG tablet Take 1 tablet (5 mg total) by mouth 2 (two) times daily before a meal. 10/01/21  Yes Eduardo Bush, MD  Lancets MISC Use to check blood sugar daily   Yes [provider]  lisinopril (ZESTRIL) 2.5 MG tablet Take 1 tablet (2.5 mg total) by mouth daily. 10/01/21  Yes Eduardo Bush, MD  pioglitazone (ACTOS) 15 MG tablet Take 1 tablet (15 mg total) by mouth daily. 10/01/21  Yes Eduardo Bush, MD   simvastatin (ZOCOR) 20 MG tablet Take 1 tablet (20 mg total) by mouth daily at 6 PM. 10/01/21  Yes Eduardo Bush, MD  vitamin C (ASCORBIC ACID) 500 MG tablet Take 500 mg by mouth daily.   Yes [provider]  OVER THE COUNTER MEDICATION daily. [facility] E[facility] DROPS ONCE A DAY-ONE DROP EACH EYE    [provider]    Current Outpatient Medications  Medication Sig Dispense Refill   aspirin EC 81 MG tablet Take 1 tablet (81 mg total) by mouth every other day.     Cyanocobalamin (VITAMIN B-12 CR) 1000 MCG TBCR Take 1 tablet by mouth daily.     Empagliflozin-metFORMIN HCl (SYNJARDY) 05-998 MG TABS Take 1 tablet by mouth 2 (two) times daily. 60 tablet 11   FREESTYLE LITE test strip TEST BLOOD SUGAR EVERY DAY AND AS NEEDED 300 each 3   glipiZIDE (GLUCOTROL) 5 MG tablet Take 1 tablet (5 mg total) by mouth 2 (two) times daily before a meal. 180 tablet 3   Lancets MISC Use to check blood sugar daily     lisinopril (ZESTRIL) 2.5 MG tablet Take 1 tablet (2.5 mg total) by mouth daily. 90 tablet 3   pioglitazone (ACTOS) 15 MG tablet Take 1 tablet (15 mg total) by mouth daily. 90 tablet 3   simvastatin (ZOCOR) 20 MG tablet Take 1 tablet (20 mg total) by mouth daily at  6 PM. 90 tablet 3   vitamin C (ASCORBIC ACID) 500 MG tablet Take 500 mg by mouth daily.     OVER THE COUNTER MEDICATION daily. [facility] E[facility] DROPS ONCE A DAY-ONE DROP EACH EYE     Current Facility-Administered Medications  Medication Dose Route Frequency Provider Last Rate Last Admin   0.9 %  sodium chloride infusion  500 mL Intravenous Once Daryel November, MD        Allergies as of 10/15/2021 - Review Complete 10/15/2021  Allergen Reaction Noted   Penicillins Other (See Comments)     Family History  Problem Relation Age of Onset   Hypertension Mother    Diabetes Mother    Stroke Mother    Kidney disease Mother        failure-dialysis   Heart disease Mother        MI   Heart disease Other        CHF, CABG   Cancer  Other        lung   Colon cancer Neg Hx    Colon polyps Neg Hx    Crohn's disease Neg Hx    Esophageal cancer Neg Hx    Rectal cancer Neg Hx    Stomach cancer Neg Hx    Ulcerative colitis Neg Hx     Social History   Socioeconomic History   Marital status: Married    Spouse name: Not on file   Number of children: 3   Years of education: Not on file   Highest education level: Not on file  Occupational History   Occupation: [name]    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  Sugar free diet.   Social Determinants of Health   Financial Resource Strain: Low Risk  (08/28/2021)   Overall Financial Resource Strain (CARDIA)    Difficulty of Paying Living Expenses: Not hard at all  Food Insecurity: No Food Insecurity (08/28/2021)   Hunger Vital Sign    Worried About Running Out of Food in the Last Year: Never true    Ran Out of Food in the Last Year: Never true  Transportation Needs: No Transportation Needs (08/28/2021)   PRAPARE - Hydrologist (Medical): No    Lack of Transportation (Non-Medical): No  Physical Activity: Sufficiently Active (08/28/2021)   Exercise Vital Sign    Days of Exercise per Week: 3 days    Minutes of Exercise per Session: 60 min  Stress: No Stress Concern  Present (08/28/2021)   [place]    Feeling of Stress : Not at  all  Social Connections: Moderately Integrated (08/28/2021)   Social Connection and Isolation Panel [NHANES]    Frequency of Communication with Friends and Family: More than three times a week    Frequency of Social Gatherings with Friends and Family: More than three times a week    Attends Religious Services: More than 4 times per year    Active Member of [employer] or Organizations: No    Attends Archivist Meetings: Never    Marital Status: Married  Human resources officer Violence: Not At Risk (08/28/2021)   Humiliation, Afraid, Rape, and Kick questionnaire    Fear of Current or Ex-Partner: No    Emotionally Abused: No    Physically Abused: No    Sexually Abused: No    Review of Systems:  All other review of systems negative except as mentioned in the HPI.  Physical Exam: Vital signs BP 125/72   Pulse 69   Temp 98 F (36.7 C)   Ht '5\' 9"'$  (1.753 m)   Wt 148 lb (67.1 kg)   SpO2 99%   BMI 21.86 kg/m   General:   Alert,  Well-developed, well-nourished, pleasant and cooperative in NAD Airway:  Mallampati 1 Lungs:  Clear throughout to auscultation.   Heart:  Regular rate and rhythm; no murmurs, clicks, rubs,  or gallops. Abdomen:  Soft, nontender and nondistended. Normal bowel sounds.   Neuro/Psych:  Normal mood and affect. A and O x 3   Josue Falconi E. Candis Schatz, MD [facility]

## 2021-10-15 NOTE — Progress Notes (Signed)
Called to room to assist during endoscopic procedure.  Patient ID and intended procedure confirmed with present staff. Received instructions for my participation in the procedure from the performing physician.  

## 2021-10-16 ENCOUNTER — Telehealth: Payer: Self-pay | Admitting: *Deleted

## 2021-10-16 NOTE — Telephone Encounter (Signed)
No answer on  follow up call. Left message.   

## 2021-10-20 NOTE — Progress Notes (Signed)
Mr. Messer,  The small polyp removed during your colonoscopy was a common precancerous polyp (tubular adenoma).  Our guidelines suggest a surveillance colonoscopy in 7-10 years in this scenario.  Our guidelines also suggest against stopping routine colon cancer screening for all patients after the age of 51.  Further colonoscopies after the age 72 are done on a case-by-case basis based on the patient's comorbidities, life expectancy and prior endoscopic findings.  I would recommend against ongoing colon cancer screening given your age and absence of high risk polyps. If you would like to consider further colon cancer screening, I would recommend an office visit with [name] in 7 years.

## 2021-11-06 DIAGNOSIS — L570 Actinic keratosis: Secondary | ICD-10-CM | POA: Diagnosis not present

## 2021-12-24 DIAGNOSIS — H40013 Open angle with borderline findings, low risk, bilateral: Secondary | ICD-10-CM | POA: Diagnosis not present

## 2021-12-24 DIAGNOSIS — Z961 Presence of intraocular lens: Secondary | ICD-10-CM | POA: Diagnosis not present

## 2021-12-24 DIAGNOSIS — E119 Type 2 diabetes mellitus without complications: Secondary | ICD-10-CM | POA: Diagnosis not present

## 2021-12-24 DIAGNOSIS — H11131 Conjunctival pigmentations, right eye: Secondary | ICD-10-CM | POA: Diagnosis not present

## 2022-01-06 DIAGNOSIS — L570 Actinic keratosis: Secondary | ICD-10-CM | POA: Diagnosis not present

## 2022-02-20 DIAGNOSIS — Z961 Presence of intraocular lens: Secondary | ICD-10-CM | POA: Diagnosis not present

## 2022-02-20 DIAGNOSIS — S0500XA Injury of conjunctiva and corneal abrasion without foreign body, unspecified eye, initial encounter: Secondary | ICD-10-CM | POA: Diagnosis not present

## 2022-02-20 DIAGNOSIS — H2101 Hyphema, right eye: Secondary | ICD-10-CM | POA: Diagnosis not present

## 2022-02-20 DIAGNOSIS — S0511XA Contusion of eyeball and orbital tissues, right eye, initial encounter: Secondary | ICD-10-CM | POA: Diagnosis not present

## 2022-02-20 DIAGNOSIS — G8911 Acute pain due to trauma: Secondary | ICD-10-CM | POA: Diagnosis not present

## 2022-02-20 DIAGNOSIS — S0501XA Injury of conjunctiva and corneal abrasion without foreign body, right eye, initial encounter: Secondary | ICD-10-CM | POA: Diagnosis not present

## 2022-02-23 DIAGNOSIS — S0500XA Injury of conjunctiva and corneal abrasion without foreign body, unspecified eye, initial encounter: Secondary | ICD-10-CM | POA: Diagnosis not present

## 2022-02-23 DIAGNOSIS — H2101 Hyphema, right eye: Secondary | ICD-10-CM | POA: Diagnosis not present

## 2022-02-23 DIAGNOSIS — S0511XA Contusion of eyeball and orbital tissues, right eye, initial encounter: Secondary | ICD-10-CM | POA: Diagnosis not present

## 2022-02-23 DIAGNOSIS — S0501XA Injury of conjunctiva and corneal abrasion without foreign body, right eye, initial encounter: Secondary | ICD-10-CM | POA: Diagnosis not present

## 2022-02-23 DIAGNOSIS — G8911 Acute pain due to trauma: Secondary | ICD-10-CM | POA: Diagnosis not present

## 2022-02-26 DIAGNOSIS — S0501XA Injury of conjunctiva and corneal abrasion without foreign body, right eye, initial encounter: Secondary | ICD-10-CM | POA: Diagnosis not present

## 2022-02-26 DIAGNOSIS — H2101 Hyphema, right eye: Secondary | ICD-10-CM | POA: Diagnosis not present

## 2022-02-26 DIAGNOSIS — S0500XA Injury of conjunctiva and corneal abrasion without foreign body, unspecified eye, initial encounter: Secondary | ICD-10-CM | POA: Diagnosis not present

## 2022-02-26 DIAGNOSIS — G8911 Acute pain due to trauma: Secondary | ICD-10-CM | POA: Diagnosis not present

## 2022-02-26 DIAGNOSIS — S0511XA Contusion of eyeball and orbital tissues, right eye, initial encounter: Secondary | ICD-10-CM | POA: Diagnosis not present

## 2022-03-05 DIAGNOSIS — S0511XD Contusion of eyeball and orbital tissues, right eye, subsequent encounter: Secondary | ICD-10-CM | POA: Diagnosis not present

## 2022-03-05 DIAGNOSIS — G8911 Acute pain due to trauma: Secondary | ICD-10-CM | POA: Diagnosis not present

## 2022-03-05 DIAGNOSIS — H2101 Hyphema, right eye: Secondary | ICD-10-CM | POA: Diagnosis not present

## 2022-03-05 DIAGNOSIS — S0501XD Injury of conjunctiva and corneal abrasion without foreign body, right eye, subsequent encounter: Secondary | ICD-10-CM | POA: Diagnosis not present

## 2022-03-23 ENCOUNTER — Telehealth: Payer: Self-pay | Admitting: Family Medicine

## 2022-03-23 DIAGNOSIS — Z7189 Other specified counseling: Secondary | ICD-10-CM

## 2022-03-23 NOTE — Telephone Encounter (Signed)
Placed in your in box. 

## 2022-03-23 NOTE — Telephone Encounter (Signed)
Spoke with pt relaying Dr. Synthia Innocent message about HCPOA. Pt states he does not think he has one in place but will speak with his attorney.

## 2022-03-23 NOTE — Telephone Encounter (Signed)
Reviewed, will send for scanning. This is living will, but no HCPOA form was included. Would see if he has this info at home.

## 2022-03-23 NOTE — Telephone Encounter (Signed)
Patient came by and dropped off a copy of his living will per request of Dr Darnell Level. Left in his folder up front.

## 2022-04-01 ENCOUNTER — Ambulatory Visit (INDEPENDENT_AMBULATORY_CARE_PROVIDER_SITE_OTHER): Payer: Medicare HMO | Admitting: Family Medicine

## 2022-04-01 ENCOUNTER — Encounter: Payer: Self-pay | Admitting: Family Medicine

## 2022-04-01 VITALS — BP 132/68 | HR 77 | Temp 97.3°F | Ht 69.0 in | Wt 154.4 lb

## 2022-04-01 DIAGNOSIS — E118 Type 2 diabetes mellitus with unspecified complications: Secondary | ICD-10-CM

## 2022-04-01 DIAGNOSIS — N529 Male erectile dysfunction, unspecified: Secondary | ICD-10-CM | POA: Diagnosis not present

## 2022-04-01 DIAGNOSIS — E1169 Type 2 diabetes mellitus with other specified complication: Secondary | ICD-10-CM

## 2022-04-01 LAB — POCT GLYCOSYLATED HEMOGLOBIN (HGB A1C): Hemoglobin A1C: 7.5 % — AB (ref 4.0–5.6)

## 2022-04-01 MED ORDER — METFORMIN HCL 1000 MG PO TABS: 1000.0000 mg | ORAL_TABLET | Freq: Two times a day (BID) | ORAL | 2 refills | Status: DC

## 2022-04-01 MED ORDER — EMPAGLIFLOZIN 25 MG PO TABS: 25.0000 mg | ORAL_TABLET | Freq: Every day | ORAL | 7 refills | Status: DC

## 2022-04-01 MED ORDER — SILDENAFIL CITRATE 50 MG PO TABS: 50.0000 mg | ORAL_TABLET | Freq: Every day | ORAL | 6 refills | Status: DC | PRN

## 2022-04-01 NOTE — Assessment & Plan Note (Addendum)
Chronic, deterioration noted.  Will change Synjardy to individual metformin '1000mg'$  bid, increase jardiance to '25mg'$  daily, monitoring for UTI, yeast infection, groin cellulitis. Continue glipizide and actos at current doses.  Continue working on diabetic diet.

## 2022-04-01 NOTE — Progress Notes (Signed)
Patient ID: Eduardo Pierce, male    DOB: [birth year], 72 y.o.   MRN: [id number]  This visit was conducted in person.  BP 132/68   Pulse 77   Temp (!) 97.3 F (36.3 C) (Temporal)   Ht '5\' 9"'$  (1.753 m)   Wt 154 lb 6 oz (70 kg)   SpO2 99%   BMI 22.80 kg/m    CC: 6 mo DM f/u visit  Subjective:   HPI: Eduardo Pierce is a 72 y.o. male presenting on 04/01/2022 for Medical Management of Chronic Issues (Here for 6 mo DM f/u. )   R eye injury with wood last month while working in yard - has healed from this.   Notes some urinary dribbling in am but no UTI symptoms, yeast infection symptoms or groin cellulitis symptoms  Notes ED for the past 3-4 months - trouble obtaining erection. No chest pain with sex.   DM - does regularly check sugars 90-130s. Compliant with antihyperglycemic regimen which includes: Synjardy 5/'1000mg'$  bid, glipizide '5mg'$  BID, actos '15mg'$  daily. Denies low sugars or hypoglycemic symptoms. Denies paresthesias, blurry vision. Last diabetic eye exam 04/2021. Glucometer brand: freestyle lite. Last foot exam: 02/2021 - DUE. DSME: remotely 1997.  Lab Results  Component Value Date   HGBA1C 7.5 (A) 04/01/2022   Diabetic Foot Exam - Simple   Simple Foot Form Diabetic Foot exam was performed with the following findings: Yes 04/01/2022  9:03 AM  Visual Inspection No deformities, no ulcerations, no other skin breakdown bilaterally: Yes Sensation Testing Intact to touch and monofilament testing bilaterally: Yes Pulse Check Posterior Tibialis and Dorsalis pulse intact bilaterally: Yes Comments    Lab Results  Component Value Date   MICROALBUR 0.9 09/25/2021        Relevant past medical, surgical, family and social history reviewed and updated as indicated. Interim medical history since our last visit reviewed. Allergies and medications reviewed and updated. Outpatient Medications Prior to Visit  Medication Sig Dispense Refill   aspirin EC 81 MG tablet Take 1 tablet (81 mg  total) by mouth every other day.     Cyanocobalamin (VITAMIN B-12 CR) 1000 MCG TBCR Take 1 tablet by mouth daily.     FREESTYLE LITE test strip TEST BLOOD SUGAR EVERY DAY AND AS NEEDED 300 each 3   glipiZIDE (GLUCOTROL) 5 MG tablet Take 1 tablet (5 mg total) by mouth 2 (two) times daily before a meal. 180 tablet 3   Lancets MISC Use to check blood sugar daily     lisinopril (ZESTRIL) 2.5 MG tablet Take 1 tablet (2.5 mg total) by mouth daily. 90 tablet 3   OVER THE COUNTER MEDICATION daily. [facility] E[facility] DROPS ONCE A DAY-ONE DROP EACH EYE     pioglitazone (ACTOS) 15 MG tablet Take 1 tablet (15 mg total) by mouth daily. 90 tablet 3   simvastatin (ZOCOR) 20 MG tablet Take 1 tablet (20 mg total) by mouth daily at 6 PM. 90 tablet 3   vitamin C (ASCORBIC ACID) 500 MG tablet Take 500 mg by mouth daily.     Empagliflozin-metFORMIN HCl (SYNJARDY) 05-998 MG TABS Take 1 tablet by mouth 2 (two) times daily. 60 tablet 11   No facility-administered medications prior to visit.     Per HPI unless specifically indicated in ROS section below Review of Systems  Objective:  BP 132/68   Pulse 77   Temp (!) 97.3 F (36.3 C) (Temporal)   Ht '5\' 9"'$  (1.753 m)  Wt 154 lb 6 oz (70 kg)   SpO2 99%   BMI 22.80 kg/m   Wt Readings from Last 3 Encounters:  04/01/22 154 lb 6 oz (70 kg)  10/15/21 148 lb (67.1 kg)  10/01/21 147 lb 4 oz (66.8 kg)      Physical Exam Vitals and nursing note reviewed.  Constitutional:      Appearance: Normal appearance. He is not ill-appearing.  Eyes:     Extraocular Movements: Extraocular movements intact.     Conjunctiva/sclera: Conjunctivae normal.     Pupils: Pupils are equal, round, and reactive to light.  Cardiovascular:     Rate and Rhythm: Normal rate and regular rhythm.     Pulses: Normal pulses.     Heart sounds: Normal heart sounds. No murmur heard. Pulmonary:     Effort: Pulmonary effort is normal. No respiratory distress.     Breath sounds: Normal breath sounds. No  wheezing, rhonchi or rales.  Musculoskeletal:     Right lower leg: No edema.     Left lower leg: No edema.     Comments: See HPI for foot exam if done  Skin:    General: Skin is warm and dry.     Findings: No rash.  Neurological:     Mental Status: He is alert.  Psychiatric:        Mood and Affect: Mood normal.        Behavior: Behavior normal.       Results for orders placed or performed in visit on 04/01/22  POCT glycosylated hemoglobin (Hb A1C)  Result Value Ref Range   Hemoglobin A1C 7.5 (A) 4.0 - 5.6 %   HbA1c POC (<> result, manual entry)     HbA1c, POC (prediabetic range)     HbA1c, POC (controlled diabetic range)     Lab Results  Component Value Date   CHOL 124 09/25/2021   HDL 44.80 09/25/2021   LDLCALC 64 09/25/2021   TRIG 76.0 09/25/2021   CHOLHDL 3 09/25/2021    Assessment & Plan:   Problem List Items Addressed This Visit     Type 2 diabetes mellitus with other specified complication ([place]) - Primary    Chronic, deterioration noted.  Will change Synjardy to individual metformin '1000mg'$  bid, increase jardiance to '25mg'$  daily, monitoring for UTI, yeast infection, groin cellulitis. Continue glipizide and actos at current doses.  Continue working on diabetic diet.       Relevant Medications   metFORMIN (GLUCOPHAGE) 1000 MG tablet   empagliflozin (JARDIANCE) 25 MG TABS tablet   Other Relevant Orders   POCT glycosylated hemoglobin (Hb A1C) (Completed)   Erectile dysfunction    Noted over the past 3-4 months. No med changes in interim.  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  Discussed etiology of organic ED as well as treatment options.  Will trial viagra 50-'100mg'$  PRN relations.  Reviewed side effects and adverse effects to monitor for including but not limited to headache, flushing, chest pain, and priapism. Discussed need to avoid all forms of nitrates while taking this medication.         Meds ordered this encounter  Medications   metFORMIN  (GLUCOPHAGE) 1000 MG tablet    Sig: Take 1 tablet (1,000 mg total) by mouth 2 (two) times daily with a meal.    Dispense:  180 tablet    Refill:  2   empagliflozin (JARDIANCE) 25 MG TABS tablet    Sig: Take 1 tablet (25 mg total) by  mouth daily before breakfast.    Dispense:  30 tablet    Refill:  7   sildenafil (VIAGRA) 50 MG tablet    Sig: Take 1 tablet (50 mg total) by mouth daily as needed for erectile dysfunction.    Dispense:  10 tablet    Refill:  6    Orders Placed This Encounter  Procedures   POCT glycosylated hemoglobin (Hb A1C)    Patient Instructions  Change Synjardy to plain metformin '1000mg'$  twice daily + Jardiance (empagliflozin) '25mg'$  daily.  Continue glipizide and pioglitazone as up to now.  Work on low sugar low carb diabetic diet.  Return in 6 months for physical  We can try viagra 50-'100mg'$  as needed.   Follow up plan: Return in about 6 months (around 10/02/2022), or if symptoms worsen or fail to improve, for annual exam, prior fasting for blood work.  Ria Bush, MD

## 2022-04-01 NOTE — Patient Instructions (Addendum)
Change Synjardy to plain metformin '1000mg'$  twice daily + Jardiance (empagliflozin) '25mg'$  daily.  Continue glipizide and pioglitazone as up to now.  Work on low sugar low carb diabetic diet.  Return in 6 months for physical  We can try viagra 50-'100mg'$  as needed.

## 2022-04-01 NOTE — Assessment & Plan Note (Addendum)
Noted over the past 3-4 months. No med changes in interim.  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  Discussed etiology of organic ED as well as treatment options.  Will trial viagra 50-'100mg'$  PRN relations.  Reviewed side effects and adverse effects to monitor for including but not limited to headache, flushing, chest pain, and priapism. Discussed need to avoid all forms of nitrates while taking this medication.

## 2022-04-06 DIAGNOSIS — S0511XD Contusion of eyeball and orbital tissues, right eye, subsequent encounter: Secondary | ICD-10-CM | POA: Diagnosis not present

## 2022-04-06 DIAGNOSIS — H2101 Hyphema, right eye: Secondary | ICD-10-CM | POA: Diagnosis not present

## 2022-08-04 ENCOUNTER — Other Ambulatory Visit: Payer: Self-pay | Admitting: Family Medicine

## 2022-08-04 DIAGNOSIS — E1169 Type 2 diabetes mellitus with other specified complication: Secondary | ICD-10-CM

## 2022-08-19 ENCOUNTER — Encounter (INDEPENDENT_AMBULATORY_CARE_PROVIDER_SITE_OTHER): Payer: Self-pay

## 2022-09-03 ENCOUNTER — Encounter (INDEPENDENT_AMBULATORY_CARE_PROVIDER_SITE_OTHER): Payer: Self-pay

## 2022-09-27 ENCOUNTER — Other Ambulatory Visit: Payer: Self-pay | Admitting: Family Medicine

## 2022-09-27 DIAGNOSIS — D72819 Decreased white blood cell count, unspecified: Secondary | ICD-10-CM

## 2022-09-27 DIAGNOSIS — E1169 Type 2 diabetes mellitus with other specified complication: Secondary | ICD-10-CM

## 2022-09-27 DIAGNOSIS — Z125 Encounter for screening for malignant neoplasm of prostate: Secondary | ICD-10-CM

## 2022-09-30 ENCOUNTER — Other Ambulatory Visit: Payer: Self-pay | Admitting: Family Medicine

## 2022-09-30 ENCOUNTER — Other Ambulatory Visit: Payer: Medicare HMO

## 2022-09-30 DIAGNOSIS — E118 Type 2 diabetes mellitus with unspecified complications: Secondary | ICD-10-CM

## 2022-10-02 ENCOUNTER — Other Ambulatory Visit (INDEPENDENT_AMBULATORY_CARE_PROVIDER_SITE_OTHER): Payer: Medicare HMO

## 2022-10-02 DIAGNOSIS — E785 Hyperlipidemia, unspecified: Secondary | ICD-10-CM

## 2022-10-02 DIAGNOSIS — D72819 Decreased white blood cell count, unspecified: Secondary | ICD-10-CM

## 2022-10-02 DIAGNOSIS — E1169 Type 2 diabetes mellitus with other specified complication: Secondary | ICD-10-CM | POA: Diagnosis not present

## 2022-10-02 DIAGNOSIS — Z125 Encounter for screening for malignant neoplasm of prostate: Secondary | ICD-10-CM

## 2022-10-02 LAB — CBC WITH DIFFERENTIAL/PLATELET
Basophils Absolute: 0.1 10*3/uL (ref 0.0–0.1)
Basophils Relative: 1.4 % (ref 0.0–3.0)
Eosinophils Absolute: 0.2 10*3/uL (ref 0.0–0.7)
Eosinophils Relative: 6 % — ABNORMAL HIGH (ref 0.0–5.0)
HCT: 44.3 % (ref 39.0–52.0)
Hemoglobin: 14.6 g/dL (ref 13.0–17.0)
Lymphocytes Relative: 19.3 % (ref 12.0–46.0)
Lymphs Abs: 0.8 10*3/uL (ref 0.7–4.0)
MCHC: 33 g/dL (ref 30.0–36.0)
MCV: 99 fl (ref 78.0–100.0)
Monocytes Absolute: 0.4 10*3/uL (ref 0.1–1.0)
Monocytes Relative: 10.5 % (ref 3.0–12.0)
Neutro Abs: 2.5 10*3/uL (ref 1.4–7.7)
Neutrophils Relative %: 62.8 % (ref 43.0–77.0)
Platelets: 165 10*3/uL (ref 150.0–400.0)
RBC: 4.47 Mil/uL (ref 4.22–5.81)
RDW: 13.7 % (ref 11.5–15.5)
WBC: 3.9 10*3/uL — ABNORMAL LOW (ref 4.0–10.5)

## 2022-10-02 LAB — LIPID PANEL
Cholesterol: 128 mg/dL (ref 0–200)
HDL: 43.2 mg/dL (ref >39.00)
LDL Cholesterol: 64 mg/dL (ref 0–99)
NonHDL: 84.91
Total CHOL/HDL Ratio: 3
Triglycerides: 104 mg/dL (ref 0.0–149.0)
VLDL: 20.8 mg/dL (ref 0.0–40.0)

## 2022-10-02 LAB — COMPREHENSIVE METABOLIC PANEL
ALT: 14 U/L (ref 0–53)
AST: 15 U/L (ref 0–37)
Albumin: 4.2 g/dL (ref 3.5–5.2)
Alkaline Phosphatase: 48 U/L (ref 39–117)
BUN: 22 mg/dL (ref 6–23)
CO2: 27 meq/L (ref 19–32)
Calcium: 9.1 mg/dL (ref 8.4–10.5)
Chloride: 105 meq/L (ref 96–112)
Creatinine, Ser: 1.25 mg/dL (ref 0.40–1.50)
GFR: 57.43 mL/min — ABNORMAL LOW (ref >60.00)
Glucose, Bld: 138 mg/dL — ABNORMAL HIGH (ref 70–99)
Potassium: 4.6 meq/L (ref 3.5–5.1)
Sodium: 140 meq/L (ref 135–145)
Total Bilirubin: 0.6 mg/dL (ref 0.2–1.2)
Total Protein: 6.2 g/dL (ref 6.0–8.3)

## 2022-10-02 LAB — HEMOGLOBIN A1C: Hgb A1c MFr Bld: 7 % — ABNORMAL HIGH (ref 4.6–6.5)

## 2022-10-02 LAB — PSA: PSA: 1.54 ng/mL (ref 0.10–4.00)

## 2022-10-02 LAB — MICROALBUMIN / CREATININE URINE RATIO
Creatinine,U: 81 mg/dL
Microalb Creat Ratio: 1.1 mg/g (ref 0.0–30.0)
Microalb, Ur: 0.9 mg/dL (ref 0.0–1.9)

## 2022-10-07 ENCOUNTER — Encounter: Payer: Self-pay | Admitting: Family Medicine

## 2022-10-07 ENCOUNTER — Ambulatory Visit (INDEPENDENT_AMBULATORY_CARE_PROVIDER_SITE_OTHER): Payer: Medicare HMO | Admitting: Family Medicine

## 2022-10-07 VITALS — BP 118/76 | HR 87 | Temp 92.8°F | Ht 68.0 in | Wt 150.0 lb

## 2022-10-07 DIAGNOSIS — E1169 Type 2 diabetes mellitus with other specified complication: Secondary | ICD-10-CM

## 2022-10-07 DIAGNOSIS — N529 Male erectile dysfunction, unspecified: Secondary | ICD-10-CM | POA: Diagnosis not present

## 2022-10-07 DIAGNOSIS — Z7189 Other specified counseling: Secondary | ICD-10-CM

## 2022-10-07 DIAGNOSIS — N3943 Post-void dribbling: Secondary | ICD-10-CM

## 2022-10-07 DIAGNOSIS — E785 Hyperlipidemia, unspecified: Secondary | ICD-10-CM | POA: Diagnosis not present

## 2022-10-07 DIAGNOSIS — Z0000 Encounter for general adult medical examination without abnormal findings: Secondary | ICD-10-CM | POA: Diagnosis not present

## 2022-10-07 DIAGNOSIS — M720 Palmar fascial fibromatosis [Dupuytren]: Secondary | ICD-10-CM | POA: Diagnosis not present

## 2022-10-07 DIAGNOSIS — D72819 Decreased white blood cell count, unspecified: Secondary | ICD-10-CM

## 2022-10-07 LAB — POC URINALSYSI DIPSTICK (AUTOMATED)
Bilirubin, UA: NEGATIVE
Blood, UA: NEGATIVE
Glucose, UA: POSITIVE — AB
Ketones, UA: NEGATIVE
Leukocytes, UA: NEGATIVE
Nitrite, UA: NEGATIVE
Protein, UA: NEGATIVE
Spec Grav, UA: 1.015
Urobilinogen, UA: 0.2 U/dL
pH, UA: 5.5

## 2022-10-07 MED ORDER — EMPAGLIFLOZIN 25 MG PO TABS: 25.0000 mg | ORAL_TABLET | Freq: Every day | ORAL | 11 refills | Status: DC

## 2022-10-07 MED ORDER — SIMVASTATIN 20 MG PO TABS
20.0000 mg | ORAL_TABLET | Freq: Every day | ORAL | 4 refills | Status: DC
Start: 2022-10-07 — End: 2023-10-12

## 2022-10-07 MED ORDER — PIOGLITAZONE HCL 15 MG PO TABS
15.0000 mg | ORAL_TABLET | Freq: Every day | ORAL | 4 refills | Status: DC
Start: 2022-10-07 — End: 2023-06-15

## 2022-10-07 MED ORDER — LISINOPRIL 2.5 MG PO TABS: 2.5000 mg | ORAL_TABLET | Freq: Every day | ORAL | 4 refills | Status: DC

## 2022-10-07 MED ORDER — GLIPIZIDE 5 MG PO TABS
5.0000 mg | ORAL_TABLET | Freq: Two times a day (BID) | ORAL | 4 refills | Status: DC
Start: 2022-10-07 — End: 2023-10-12

## 2022-10-07 MED ORDER — METFORMIN HCL 1000 MG PO TABS: 1000.0000 mg | ORAL_TABLET | Freq: Two times a day (BID) | ORAL | 4 refills | Status: DC

## 2022-10-07 NOTE — Assessment & Plan Note (Signed)
Preventative protocols reviewed and updated unless pt declined. Discussed healthy diet and lifestyle.  

## 2022-10-07 NOTE — Assessment & Plan Note (Signed)
Chronic, stable on simvastatin 20mg  daily. The ASCVD Risk score (Arnett DK, et al., 2019) failed to calculate for the following reasons:   The valid total cholesterol range is 130 to 320 mg/dL

## 2022-10-07 NOTE — Progress Notes (Signed)
Ph: [phone] Fax: [phone]   Patient ID: Eduardo Pierce, male    DOB: [birth year], 72 y.o.   MRN: [id number]  This visit was conducted in person.  BP 118/76   Pulse 87   Temp (!) 92.8 F (33.8 C)   Ht 5\' 8"  (1.727 m)   Wt 150 lb (68 kg)   SpO2 98%   BMI 22.81 kg/m    CC: CPE/AMW Subjective:   HPI: Eduardo Pierce is a 72 y.o. male presenting on 10/07/2022 for Annual Exam (Pt is not fasting. )   Did not see health advisor this year  No results found.  Flowsheet Row Office Visit from 04/01/2022 in [facility] [place]  PHQ-2 Total Score 0          04/01/2022    8:32 AM 08/28/2021    3:02 PM 08/22/2020    8:23 AM 08/03/2019    9:35 AM 07/11/2018    8:07 AM  Fall Risk   Falls in the past year? 0 0 0 0 0  Number falls in past yr:  0 0    Injury with Fall?  0 0    Risk for fall due to :  No Fall Risks Medication side effect    Follow up  Falls evaluation completed Falls evaluation completed;Falls prevention discussed     ED - last year started viagra 50-100mg  PRN.  DM - Synjardy changed to individual components due to increased cost of combo medication.  Lab Results  Component Value Date   HGBA1C 7.0 (H) 10/02/2022    Preventative: Colon screening - 06/08/2011. Prolapse type polyp, rpt due 10 yrs Christella Hartigan)  Colonoscopy 09/2021 - 1 TA, consider rpt 7-10 yrs Tomasa Rand) Prostate - yearly PSA check. Nocturia x1, strong stream.  Lung cancer screening - quit smoking 1988  Flu shot - yearly  COVID vaccine - Pfizer 02/2019, 03/2019, booster 09/2019, bivalent 11/2020 Prevnar-13 06/2017. Pneumovax-23 06/2018.  Td - 2006  Zostavax 09/2012  Shingrix - 08/2018, 11/2018  Advanced directive discussion: scanned 03/2022. Full code based on previous discussion. Does not want prolonged life support if terminal condition. HCPOA form brought today - wife then daughter are HCPOA.  Seat belt use discussed  Sunscreen use discussed. No changing moles on skin.  Sees derm yearly ([facility]). S/p blue light therapy years ago.  Sleep - averaging 7-8 hours/night Ex smoker. Quit 1988. Wife quit smoking as well Alcohol - none  Dentist - Q60mo  Eye exam - Dr Dione Booze  Bowel - no constipation Bladder - no incontinence  "[name]"   Married lives wife 3 children Retired 2014 Activity: Exercises [facility] [employer] 2-3 times per week - treadmill  Diet: good water, fruits/vegetables daily. Sugar free diet      Relevant past medical, surgical, family and social history reviewed and updated as indicated. Interim medical history since our last visit reviewed. Allergies and medications reviewed and updated. Outpatient Medications Prior to Visit  Medication Sig Dispense Refill   aspirin EC 81 MG tablet Take 1 tablet (81 mg total) by mouth every other day.     Cyanocobalamin (VITAMIN B-12 CR) 1000 MCG TBCR Take 1 tablet by mouth daily.     FREESTYLE LITE test strip TEST BLOOD SUGAR EVERY DAY AND AS NEEDED 300 each 3   Lancets MISC Use to check blood sugar daily     OVER THE COUNTER MEDICATION daily. [facility] E[facility] DROPS ONCE A DAY-ONE DROP EACH EYE  sildenafil (VIAGRA) 50 MG tablet Take 1 tablet (50 mg total) by mouth daily as needed for erectile dysfunction. 10 tablet 6   vitamin C (ASCORBIC ACID) 500 MG tablet Take 500 mg by mouth daily.     empagliflozin (JARDIANCE) 25 MG TABS tablet Take 1 tablet (25 mg total) by mouth daily before breakfast. 30 tablet 7   glipiZIDE (GLUCOTROL) 5 MG tablet TAKE 1 TABLET (5 MG TOTAL) BY MOUTH TWICE A DAY BEFORE MEALS 180 tablet 0   lisinopril (ZESTRIL) 2.5 MG tablet TAKE 1 TABLET BY MOUTH EVERY DAY 90 tablet 0   metFORMIN (GLUCOPHAGE) 1000 MG tablet Take 1 tablet (1,000 mg total) by mouth 2 (two) times daily with a meal. 180 tablet 2   pioglitazone (ACTOS) 15 MG tablet TAKE 1 TABLET (15 MG TOTAL) BY MOUTH DAILY. 90 tablet 0   simvastatin (ZOCOR) 20 MG tablet TAKE 1 TABLET BY MOUTH DAILY AT 6 PM. 90 tablet 0   No facility-administered  medications prior to visit.     Per HPI unless specifically indicated in ROS section below Review of Systems  Constitutional:  Negative for activity change, appetite change, chills, fatigue, fever and unexpected weight change.  HENT:  Negative for hearing loss.   Eyes:  Negative for visual disturbance.  Respiratory:  Negative for cough, chest tightness, shortness of breath and wheezing.   Cardiovascular:  Negative for chest pain, palpitations and leg swelling.  Gastrointestinal:  Negative for abdominal distention, abdominal pain, blood in stool, constipation, diarrhea, nausea and vomiting.  Genitourinary:  Negative for difficulty urinating and hematuria.  Musculoskeletal:  Negative for arthralgias, myalgias and neck pain.  Skin:  Negative for rash.  Neurological:  Negative for dizziness, seizures, syncope and headaches.  Hematological:  Negative for adenopathy. Does not bruise/bleed easily.  Psychiatric/Behavioral:  Negative for dysphoric mood. The patient is not nervous/anxious.     Objective:  BP 118/76   Pulse 87   Temp (!) 92.8 F (33.8 C)   Ht 5\' 8"  (1.727 m)   Wt 150 lb (68 kg)   SpO2 98%   BMI 22.81 kg/m   Wt Readings from Last 3 Encounters:  10/07/22 150 lb (68 kg)  04/01/22 154 lb 6 oz (70 kg)  10/15/21 148 lb (67.1 kg)      Physical Exam Vitals and nursing note reviewed.  Constitutional:      General: He is not in acute distress.    Appearance: Normal appearance. He is well-developed. He is not ill-appearing.  HENT:     Head: Normocephalic and atraumatic.     Right Ear: Hearing, tympanic membrane, ear canal and external ear normal.     Left Ear: Hearing, tympanic membrane, ear canal and external ear normal.     Nose: Nose normal.     Mouth/Throat:     Mouth: Mucous membranes are moist.     Pharynx: Oropharynx is clear. No oropharyngeal exudate or posterior oropharyngeal erythema.  Eyes:     General: No scleral icterus.    Extraocular Movements: Extraocular  movements intact.     Conjunctiva/sclera: Conjunctivae normal.     Pupils: Pupils are equal, round, and reactive to light.  Neck:     Thyroid: No thyroid mass or thyromegaly.  Cardiovascular:     Rate and Rhythm: Normal rate and regular rhythm.     Pulses: Normal pulses.          Radial pulses are 2+ on the right side and 2+ on the left side.  Heart sounds: Normal heart sounds. No murmur heard. Pulmonary:     Effort: Pulmonary effort is normal. No respiratory distress.     Breath sounds: Normal breath sounds. No wheezing, rhonchi or rales.  Abdominal:     General: Bowel sounds are normal. There is no distension.     Palpations: Abdomen is soft. There is no mass.     Tenderness: There is no abdominal tenderness. There is no guarding or rebound.     Hernia: No hernia is present.  Musculoskeletal:        General: Normal range of motion.     Cervical back: Normal range of motion and neck supple.     Right lower leg: No edema.     Left lower leg: No edema.  Lymphadenopathy:     Cervical: No cervical adenopathy.  Skin:    General: Skin is warm and dry.     Findings: No rash.  Neurological:     General: No focal deficit present.     Mental Status: He is alert and oriented to person, place, and time.     Comments:  Recall 3/3 Calculation 5/5 DLROW  Psychiatric:        Mood and Affect: Mood normal.        Behavior: Behavior normal.        Thought Content: Thought content normal.        Judgment: Judgment normal.       Results for orders placed or performed in visit on 10/07/22  POCT Urinalysis Dipstick (Automated)  Result Value Ref Range   Color, UA YELLOW    Clarity, UA CLEAR    Glucose, UA Negative (A) Negative   Bilirubin, UA NEG    Ketones, UA NEG    Spec Grav, UA 1.015 1.010 - 1.025   Blood, UA NEG    pH, UA 5.5 5.0 - 8.0   Protein, UA Negative Negative   Urobilinogen, UA 0.2 0.2 or 1.0 E.U./dL   Nitrite, UA NEG    Leukocytes, UA Negative Negative     Assessment & Plan:   Problem List Items Addressed This Visit     Advanced care planning/counseling discussion (Chronic)    Advanced directive discussion: scanned 03/2022. Full code based on previous discussion. Does not want prolonged life support if terminal condition. HCPOA form brought today - wife then daughter are HCPOA.       Medicare annual wellness visit, subsequent - Primary (Chronic)    I have personally reviewed the Medicare Annual Wellness questionnaire and have noted 1. The patient's medical and social history 2. Their use of alcohol, tobacco or illicit drugs 3. Their current medications and supplements 4. The patient's functional ability including ADL's, fall risks, home safety risks and hearing or visual impairment. Cognitive function has been assessed and addressed as indicated.  5. Diet and physical activity 6. Evidence for depression or mood disorders The patients weight, height, BMI have been recorded in the chart. I have made referrals, counseling and provided education to the patient based on review of the above and I have provided the pt with a written personalized care plan for preventive services. Provider list updated.. See scanned questionairre as needed for further documentation. Reviewed preventative protocols and updated unless pt declined.       Health maintenance examination (Chronic)    Preventative protocols reviewed and updated unless pt declined. Discussed healthy diet and lifestyle.       Type 2 diabetes mellitus with other specified complication (  HCC)    Chronic, stable on current regimen - actos, glipizide, metformin, jardiance - continue.       Relevant Medications   empagliflozin (JARDIANCE) 25 MG TABS tablet   glipiZIDE (GLUCOTROL) 5 MG tablet   lisinopril (ZESTRIL) 2.5 MG tablet   metFORMIN (GLUCOPHAGE) 1000 MG tablet   pioglitazone (ACTOS) 15 MG tablet   simvastatin (ZOCOR) 20 MG tablet   Hyperlipidemia associated with type 2  diabetes mellitus (HCC)    Chronic, stable on simvastatin 20mg  daily. The ASCVD Risk score (Arnett DK, et al., 2019) failed to calculate for the following reasons:   The valid total cholesterol range is 130 to 320 mg/dL       Relevant Medications   empagliflozin (JARDIANCE) 25 MG TABS tablet   glipiZIDE (GLUCOTROL) 5 MG tablet   lisinopril (ZESTRIL) 2.5 MG tablet   metFORMIN (GLUCOPHAGE) 1000 MG tablet   pioglitazone (ACTOS) 15 MG tablet   simvastatin (ZOCOR) 20 MG tablet   Leukopenia    Chronic, mild, stable.       Erectile dysfunction    Continue viagra PRN.       Dupuytren's contracture of right hand    Planning to see hand surgery for this ([facility]).       Other Visit Diagnoses     Urinary dribbling       Relevant Orders   POCT Urinalysis Dipstick (Automated) (Completed)        Meds ordered this encounter  Medications   empagliflozin (JARDIANCE) 25 MG TABS tablet    Sig: Take 1 tablet (25 mg total) by mouth daily before breakfast.    Dispense:  30 tablet    Refill:  11   glipiZIDE (GLUCOTROL) 5 MG tablet    Sig: Take 1 tablet (5 mg total) by mouth 2 (two) times daily before a meal.    Dispense:  180 tablet    Refill:  4   lisinopril (ZESTRIL) 2.5 MG tablet    Sig: Take 1 tablet (2.5 mg total) by mouth daily.    Dispense:  90 tablet    Refill:  4   metFORMIN (GLUCOPHAGE) 1000 MG tablet    Sig: Take 1 tablet (1,000 mg total) by mouth 2 (two) times daily with a meal.    Dispense:  180 tablet    Refill:  4   pioglitazone (ACTOS) 15 MG tablet    Sig: Take 1 tablet (15 mg total) by mouth daily.    Dispense:  90 tablet    Refill:  4   simvastatin (ZOCOR) 20 MG tablet    Sig: Take 1 tablet (20 mg total) by mouth daily at 6 PM.    Dispense:  90 tablet    Refill:  4    Orders Placed This Encounter  Procedures   POCT Urinalysis Dipstick (Automated)    Patient Instructions  Urinalysis today  You are doing well Continue current medicines Return as needed  or in 6 months for diabetes follow up visit   Follow up plan: Return in about 6 months (around 04/06/2023), or if symptoms worsen or fail to improve, for follow up visit.  Eustaquio Boyden, MD

## 2022-10-07 NOTE — Assessment & Plan Note (Signed)
Chronic, mild, stable.

## 2022-10-07 NOTE — Assessment & Plan Note (Addendum)
Advanced directive discussion: scanned 03/2022. Full code based on previous discussion. Does not want prolonged life support if terminal condition. HCPOA form brought today - wife then daughter are HCPOA.

## 2022-10-07 NOTE — Assessment & Plan Note (Signed)

## 2022-10-07 NOTE — Assessment & Plan Note (Addendum)
Planning to see hand surgery for this ([facility]).

## 2022-10-07 NOTE — Assessment & Plan Note (Signed)
Chronic, stable on current regimen - actos, glipizide, metformin, jardiance - continue.

## 2022-10-07 NOTE — Patient Instructions (Addendum)
Urinalysis today  You are doing well Continue current medicines Return as needed or in 6 months for diabetes follow up visit

## 2022-10-07 NOTE — Assessment & Plan Note (Signed)
Continue viagra PRN

## 2022-10-08 DIAGNOSIS — H40013 Open angle with borderline findings, low risk, bilateral: Secondary | ICD-10-CM | POA: Diagnosis not present

## 2022-10-08 LAB — HM DIABETES EYE EXAM

## 2023-03-18 DIAGNOSIS — M79644 Pain in right finger(s): Secondary | ICD-10-CM | POA: Diagnosis not present

## 2023-03-18 DIAGNOSIS — M720 Palmar fascial fibromatosis [Dupuytren]: Secondary | ICD-10-CM | POA: Diagnosis not present

## 2023-03-18 DIAGNOSIS — M79645 Pain in left finger(s): Secondary | ICD-10-CM | POA: Diagnosis not present

## 2023-04-06 ENCOUNTER — Ambulatory Visit (INDEPENDENT_AMBULATORY_CARE_PROVIDER_SITE_OTHER): Payer: Medicare HMO | Admitting: Family Medicine

## 2023-04-06 ENCOUNTER — Encounter: Payer: Self-pay | Admitting: Family Medicine

## 2023-04-06 VITALS — BP 128/66 | HR 77 | Temp 97.9°F | Ht 68.0 in | Wt 153.4 lb

## 2023-04-06 DIAGNOSIS — N289 Disorder of kidney and ureter, unspecified: Secondary | ICD-10-CM

## 2023-04-06 DIAGNOSIS — Z7984 Long term (current) use of oral hypoglycemic drugs: Secondary | ICD-10-CM

## 2023-04-06 DIAGNOSIS — E1122 Type 2 diabetes mellitus with diabetic chronic kidney disease: Secondary | ICD-10-CM | POA: Insufficient documentation

## 2023-04-06 DIAGNOSIS — E1169 Type 2 diabetes mellitus with other specified complication: Secondary | ICD-10-CM | POA: Diagnosis not present

## 2023-04-06 LAB — POCT GLYCOSYLATED HEMOGLOBIN (HGB A1C): Hemoglobin A1C: 7.1 % — AB (ref 4.0–5.6)

## 2023-04-06 NOTE — Progress Notes (Signed)
 Ph: [phone] Fax: [phone]   Patient ID: Eduardo Pierce, male    DOB: 1950/08/18, 73 y.o.   MRN: [id number]  This visit was conducted in person.  BP 128/66   Pulse 77   Temp 97.9 F (36.6 C) (Oral)   Ht 5\' 8"  (1.727 m)   Wt 153 lb 6 oz (69.6 kg)   SpO2 97%   BMI 23.32 kg/m    CC: DM f/u visit  Subjective:   HPI: Eduardo Pierce is a 73 y.o. male presenting on 04/06/2023 for Medical Management of Chronic Issues (Here for 6 mo DM f/u. 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   DM - does regularly check sugars every morning fasting 100-130s - today 134. Compliant with antihyperglycemic regimen which includes: metformin 1000mg  bid, jardiance 25mg  daily, glipizide 5mg  bid and actos 15mg  daily. Denies low sugars or hypoglycemic symptoms. Denies paresthesias, blurry vision. Last diabetic eye exam - fall 2022/05/04 ([facility]). Glucometer brand: freestyle lite. Last foot exam: 2022-05-04 - [facility]. DSME: 1997. Goes to [employer], treadmill and nautilus Lab Results  Component Value Date   HGBA1C 7.1 (A) 04/06/2023   Diabetic Foot Exam - Simple   Simple Foot Form Diabetic Foot exam was performed with the following findings: Yes 04/06/2023  8:22 AM  Visual Inspection No deformities, no ulcerations, no other skin breakdown bilaterally: Yes Sensation Testing Intact to touch and monofilament testing bilaterally: Yes Pulse Check Posterior Tibialis and Dorsalis pulse intact bilaterally: Yes Comments No claudication    Lab Results  Component Value Date   MICROALBUR 0.9 10/02/2022         Relevant past medical, surgical, family and social history reviewed and updated as indicated. Interim medical history since our last visit reviewed. Allergies and medications reviewed and updated. Outpatient Medications Prior to Visit  Medication Sig Dispense Refill   aspirin EC 81 MG tablet Take 1 tablet (81 mg total) by mouth every  other day.     Cyanocobalamin (VITAMIN B-12 CR) 1000 MCG TBCR Take 1 tablet by mouth daily.     empagliflozin (JARDIANCE) 25 MG TABS tablet Take 1 tablet (25 mg total) by mouth daily before breakfast. 30 tablet 11   FREESTYLE LITE test strip TEST BLOOD SUGAR EVERY DAY AND AS NEEDED 300 each 3   glipiZIDE (GLUCOTROL) 5 MG tablet Take 1 tablet (5 mg total) by mouth 2 (two) times daily before a meal. 180 tablet 4   Lancets MISC Use to check blood sugar daily     lisinopril (ZESTRIL) 2.5 MG tablet Take 1 tablet (2.5 mg total) by mouth daily. 90 tablet 4   metFORMIN (GLUCOPHAGE) 1000 MG tablet Take 1 tablet (1,000 mg total) by mouth 2 (two) times daily with a meal. 180 tablet 4   OVER THE COUNTER MEDICATION daily. [facility] E[facility] DROPS ONCE A DAY-ONE DROP EACH EYE     pioglitazone (ACTOS) 15 MG tablet Take 1 tablet (15 mg total) by mouth daily. 90 tablet 4   sildenafil (VIAGRA) 50 MG tablet Take 1 tablet (50 mg total) by mouth daily as needed for erectile dysfunction. 10 tablet 6   simvastatin (ZOCOR) 20 MG tablet Take 1 tablet (20 mg total) by mouth daily at 6 PM. 90 tablet 4   vitamin C (ASCORBIC ACID) 500 MG tablet Take 500 mg by mouth daily.     No facility-administered medications prior to visit.  Per HPI unless specifically indicated in ROS section below Review of Systems  Objective:  BP 128/66   Pulse 77   Temp 97.9 F (36.6 C) (Oral)   Ht 5\' 8"  (1.727 m)   Wt 153 lb 6 oz (69.6 kg)   SpO2 97%   BMI 23.32 kg/m   Wt Readings from Last 3 Encounters:  04/06/23 153 lb 6 oz (69.6 kg)  10/07/22 150 lb (68 kg)  04/01/22 154 lb 6 oz (70 kg)      Physical Exam Vitals and nursing note reviewed.  Constitutional:      Appearance: Normal appearance. He is not ill-appearing.  Eyes:     Extraocular Movements: Extraocular movements intact.     Conjunctiva/sclera: Conjunctivae normal.     Pupils: Pupils are equal, round, and reactive to light.  Cardiovascular:     Rate and Rhythm: Normal rate  and regular rhythm.     Pulses: Normal pulses.     Heart sounds: Normal heart sounds. No murmur heard. Pulmonary:     Effort: Pulmonary effort is normal. No respiratory distress.     Breath sounds: Normal breath sounds. No wheezing, rhonchi or rales.  Musculoskeletal:     Right lower leg: No edema.     Left lower leg: No edema.     Comments: See HPI for foot exam if done  Skin:    General: Skin is warm and dry.     Findings: No rash.  Neurological:     Mental Status: He is alert.  Psychiatric:        Mood and Affect: Mood normal.        Behavior: Behavior normal.       Results for orders placed or performed in visit on 04/06/23  POCT glycosylated hemoglobin (Hb A1C)   Collection Time: 04/06/23  8:14 AM  Result Value Ref Range   Hemoglobin A1C 7.1 (A) 4.0 - 5.6 %   HbA1c POC (<> result, manual entry)     HbA1c, POC (prediabetic range)     HbA1c, POC (controlled diabetic range)      Assessment & Plan:   Problem List Items Addressed This Visit     Type 2 diabetes mellitus with other specified complication (HCC) - Primary   Chronic, stable. Continue current regimen. Tolerating well.  Foot exam today Will request latest eye exam.  Prefers not to be on injectable medication.       Relevant Orders   POCT glycosylated hemoglobin (Hb A1C) (Completed)   Renal insufficiency   Discussed noted borderline GFR 50-60s over the years.  He already drinks good water for hydration, avoids NSAIDs, good BP and sugar control reviewed.         No orders of the defined types were placed in this encounter.   Orders Placed This Encounter  Procedures   POCT glycosylated hemoglobin (Hb A1C)    Patient Instructions  You are doing well today Continue current medicines Return in 6 months for physical/wellness visit  Follow up plan: Return in about 6 months (around 10/07/2023) for medicare wellness visit.  Eustaquio Boyden, MD

## 2023-04-06 NOTE — Patient Instructions (Signed)
You are doing well today  Continue current medicines  Return in 6 months for physical/wellness visit.

## 2023-04-06 NOTE — Assessment & Plan Note (Signed)
 Discussed noted borderline GFR 50-60s over the years.  He already drinks good water for hydration, avoids NSAIDs, good BP and sugar control reviewed.

## 2023-04-06 NOTE — Assessment & Plan Note (Signed)
 Chronic, stable. Continue current regimen. Tolerating well.  Foot exam today Will request latest eye exam.  Prefers not to be on injectable medication.

## 2023-05-05 DIAGNOSIS — M720 Palmar fascial fibromatosis [Dupuytren]: Secondary | ICD-10-CM | POA: Diagnosis not present

## 2023-05-05 DIAGNOSIS — Z4789 Encounter for other orthopedic aftercare: Secondary | ICD-10-CM | POA: Diagnosis not present

## 2023-05-05 DIAGNOSIS — M25641 Stiffness of right hand, not elsewhere classified: Secondary | ICD-10-CM | POA: Diagnosis not present

## 2023-05-21 ENCOUNTER — Other Ambulatory Visit: Payer: Self-pay | Admitting: Family Medicine

## 2023-05-21 DIAGNOSIS — E1169 Type 2 diabetes mellitus with other specified complication: Secondary | ICD-10-CM

## 2023-05-21 NOTE — Telephone Encounter (Signed)
 Too soon. Rxs sent 10/07/22, #180/0 refills for glipizide  and #90/4 refills for lisinopril  to [facility].   Requests denied

## 2023-05-24 ENCOUNTER — Other Ambulatory Visit: Payer: Self-pay | Admitting: Family Medicine

## 2023-05-24 DIAGNOSIS — M720 Palmar fascial fibromatosis [Dupuytren]: Secondary | ICD-10-CM | POA: Diagnosis not present

## 2023-05-24 DIAGNOSIS — Z4789 Encounter for other orthopedic aftercare: Secondary | ICD-10-CM | POA: Diagnosis not present

## 2023-05-24 NOTE — Telephone Encounter (Signed)
 Too soon. Rx sent 10/07/22, #90/4 refills to [facility].   Request denied.

## 2023-05-31 ENCOUNTER — Other Ambulatory Visit: Payer: Self-pay | Admitting: Family Medicine

## 2023-05-31 DIAGNOSIS — E1169 Type 2 diabetes mellitus with other specified complication: Secondary | ICD-10-CM

## 2023-06-01 NOTE — Telephone Encounter (Signed)
 Too soon. Rx sent 10/07/22, #180/4 refills to [facility].  Request denied.

## 2023-06-13 ENCOUNTER — Other Ambulatory Visit: Payer: Self-pay | Admitting: Family Medicine

## 2023-06-13 DIAGNOSIS — E1169 Type 2 diabetes mellitus with other specified complication: Secondary | ICD-10-CM

## 2023-09-28 ENCOUNTER — Ambulatory Visit (INDEPENDENT_AMBULATORY_CARE_PROVIDER_SITE_OTHER)

## 2023-09-28 VITALS — BP 128/66 | Ht 68.0 in | Wt 150.0 lb

## 2023-09-28 DIAGNOSIS — Z0000 Encounter for general adult medical examination without abnormal findings: Secondary | ICD-10-CM

## 2023-09-28 NOTE — Patient Instructions (Signed)
 [name],  Thank you for taking the time for your Medicare Wellness Visit. I appreciate your continued commitment to your health goals. Please review the care plan we discussed, and feel free to reach out if I can assist you further.  Medicare recommends these wellness visits once per year to help you and your care team stay ahead of potential health issues. These visits are designed to focus on prevention, allowing your provider to concentrate on managing your acute and chronic conditions during your regular appointments.  Please note that Annual Wellness Visits do not include a physical exam. Some assessments may be limited, especially if the visit was conducted virtually. If needed, we may recommend a separate in-person follow-up with your provider.  Ongoing Care Seeing your primary care provider every 3 to 6 months helps us  monitor your health and provide consistent, personalized care.   Referrals If a referral was made during today's visit and you haven't received any updates within two weeks, please contact the referred provider directly to check on the status.  Recommended Screenings:  Health Maintenance  Topic Date Due   Yearly kidney health urinalysis for diabetes  12/10/2009   DTaP/Tdap/Td vaccine (2 - Tdap) 11/20/2014   Flu Shot  08/20/2023   COVID-19 Vaccine (5 - 2025-26 season) 09/20/2023   Yearly kidney function blood test for diabetes  10/02/2023   Hemoglobin A1C  10/07/2023   Eye exam for diabetics  10/08/2023   Complete foot exam   04/05/2024   Medicare Annual Wellness Visit  09/27/2024   Colon Cancer Screening  10/15/2028   Pneumococcal Vaccine for age over 72  Completed   Hepatitis C Screening  Completed   Zoster (Shingles) Vaccine  Completed   HPV Vaccine  Aged Out   Meningitis B Vaccine  Aged Out       09/28/2023    4:02 PM  Advanced Directives  Does Patient Have a Medical Advance Directive? No  Would patient like information on creating a medical advance  directive? No - Patient declined   Advance Care Planning is important because it: Ensures you receive medical care that aligns with your values, goals, and preferences. Provides guidance to your family and loved ones, reducing the emotional burden of decision-making during critical moments.  Vision: Annual vision screenings are recommended for early detection of glaucoma, cataracts, and diabetic retinopathy. These exams can also reveal signs of chronic conditions such as diabetes and high blood pressure.  Dental: Annual dental screenings help detect early signs of oral cancer, gum disease, and other conditions linked to overall health, including heart disease and diabetes.  Please see the attached documents for additional preventive care recommendations.

## 2023-09-28 NOTE — Progress Notes (Signed)
 Because this visit was a virtual/telehealth visit,  certain criteria was not obtained, such a blood pressure, CBG if applicable, and timed get up and go. Any medications not marked as taking were not mentioned during the medication reconciliation part of the visit. Any vitals not documented were not able to be obtained due to this being a telehealth visit or patient was unable to self-report a recent blood pressure reading due to a lack of equipment at home via telehealth. Vitals that have been documented are verbally provided by the patient.  This visit was performed by a medical professional under my direct supervision. I was immediately available for consultation/collaboration. I have reviewed and agree with the Annual Wellness Visit documentation.  Subjective:   Eduardo Pierce is a 73 y.o. who presents for a Medicare Wellness preventive visit.  As a reminder, Annual Wellness Visits don't include a physical exam, and some assessments may be limited, especially if this visit is performed virtually. We may recommend an in-person follow-up visit with your provider if needed.  Visit Complete: Virtual I connected with  Eduardo Pierce on 09/28/23 by a audio enabled telemedicine application and verified that I am speaking with the correct person using two identifiers.  Patient Location: Home  Provider Location: Home Office  I discussed the limitations of evaluation and management by telemedicine. The patient expressed understanding and agreed to proceed.  Vital Signs: Because this visit was a virtual/telehealth visit, some criteria may be missing or patient reported. Any vitals not documented were not able to be obtained and vitals that have been documented are patient reported.  VideoDeclined- This patient declined Librarian, academic. Therefore the visit was completed with audio only.  Persons Participating in Visit: Patient.  AWV Questionnaire: No: Patient  Medicare AWV questionnaire was not completed prior to this visit.  Cardiac Risk Factors include: advanced age (>58men, >37 women);male gender     Objective:    Today's Vitals   09/28/23 1557  BP: 128/66  Weight: 150 lb (68 kg)  Height: 5' 8 (1.727 m)   Body mass index is 22.81 kg/m.     09/28/2023    4:02 PM 08/28/2021    3:02 PM 08/22/2020    8:22 AM 07/11/2018    8:17 AM 06/17/2017   12:51 PM 03/13/2011   11:00 PM  Advanced Directives  Does Patient Have a Medical Advance Directive? No No Yes Yes Yes  Patient has advance directive, copy not in chart   Type of Advance Directive   Healthcare Power of [place];Living will Healthcare Power of [place];Living will Healthcare Power of [place];Living will Healthcare Power of Attorney   Copy of Healthcare Power of Attorney in Chart?   No - copy requested No - copy requested  No - copy requested  Copy requested from family   Would patient like information on creating a medical advance directive? No - Patient declined No - Patient declined      Pre-existing out of facility DNR order (yellow form or pink MOST form)      No      Data saved with a previous flowsheet row definition    Current Medications (verified) Outpatient Encounter Medications as of 09/28/2023  Medication Sig   aspirin  EC 81 MG tablet Take 1 tablet (81 mg total) by mouth every other day.   Cyanocobalamin (VITAMIN B-12 CR) 1000 MCG TBCR Take 1 tablet by mouth daily.   empagliflozin  (JARDIANCE ) 25 MG TABS tablet Take 1 tablet (25 mg  total) by mouth daily before breakfast.   FREESTYLE LITE test strip TEST BLOOD SUGAR EVERY DAY AND AS NEEDED   glipiZIDE  (GLUCOTROL ) 5 MG tablet Take 1 tablet (5 mg total) by mouth 2 (two) times daily before a meal.   Lancets MISC Use to check blood sugar daily   lisinopril  (ZESTRIL ) 2.5 MG tablet Take 1 tablet (2.5 mg total) by mouth daily.   metFORMIN  (GLUCOPHAGE ) 1000 MG tablet Take 1 tablet (1,000 mg total) by mouth 2 (two) times daily with a  meal.   OVER THE COUNTER MEDICATION daily. [facility] E[facility] DROPS ONCE A DAY-ONE DROP EACH EYE   pioglitazone  (ACTOS ) 15 MG tablet TAKE 1 TABLET (15 MG TOTAL) BY MOUTH DAILY.   sildenafil  (VIAGRA ) 50 MG tablet Take 1 tablet (50 mg total) by mouth daily as needed for erectile dysfunction.   simvastatin  (ZOCOR ) 20 MG tablet Take 1 tablet (20 mg total) by mouth daily at 6 PM.   vitamin C (ASCORBIC ACID) 500 MG tablet Take 500 mg by mouth daily.   No facility-administered encounter medications on file as of 09/28/2023.    Allergies (verified) Penicillins   History: Past Medical History:  Diagnosis Date   Cataract    BILATERAL,REMOVED   Diabetes mellitus type II 10/20/1995   completed DSME   Hyperlipidemia 02/20/1996   Leukopenia 03/20/2011   mild, normal periph smear, normal B12 and folate   Past Surgical History:  Procedure Laterality Date   COLONOSCOPY  05/20/2011   prolapse type polyp, rpt due 10 yrs   COLONOSCOPY  09/2021   1 TA, consider rpt 7-10 yrs [place])   hospitalization  02/20/2011   vasovagal presyncope, some NSVT on tele   nuclear stress echo  02/20/2011   no ischemia/infarct, normal LV wall motion, EF 68%   Family History  Problem Relation Age of Onset   Hypertension Mother    Diabetes Mother    Stroke Mother    Kidney disease Mother        failure-dialysis   Heart disease Mother        MI   Heart disease Other        CHF, CABG   Cancer Other        lung   Colon cancer Neg Hx    Colon polyps Neg Hx    Crohn's disease Neg Hx    Esophageal cancer Neg Hx    Rectal cancer Neg Hx    Stomach cancer Neg Hx    Ulcerative colitis Neg Hx    Social History   Socioeconomic History   Marital status: Married    Spouse name: Not on file   Number of children: 3   Years of education: Not on file   Highest education level: Not on file  Occupational History   Occupation: [employer]    Employer: [employer]  Tobacco Use   Smoking status:  Former    Current packs/day: 0.00    Average packs/day: 0.5 packs/day for 18.0 years (9.0 ttl pk-yrs)    Types: Cigarettes    Start date: 01/20/1968    Quit date: 01/19/1986    Years since quitting: 37.7    Passive exposure: Past (WIFE SMOKED ,QUIT 2 YEARS AGO)   Smokeless tobacco: Never  Vaping Use   Vaping status: Never Used  Substance and Sexual Activity   Alcohol use: No   Drug use: No   Sexual activity: Not Currently  Other Topics Concern  Not on file  Social History Narrative   [name]   Hobbies mgf rep pumps   Married lives wife   3 children, 1 out of home, 1 grandchild   Activity: 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  Sugar free diet.   Social Drivers of Corporate investment banker Strain: Low Risk  (09/28/2023)   Overall Financial Resource Strain (CARDIA)    Difficulty of Paying Living Expenses: Not hard at all  Food Insecurity: No Food Insecurity (09/28/2023)   Hunger Vital Sign    Worried About Running Out of Food in the Last Year: Never true    Ran Out of Food in the Last Year: Never true  Transportation Needs: No Transportation Needs (09/28/2023)   PRAPARE - Administrator, Civil Service (Medical): No    Lack of Transportation (Non-Medical): No  Physical Activity: Sufficiently Active (09/28/2023)   Exercise Vital Sign    Days of Exercise per Week: 3 days    Minutes of Exercise per Session: 60 min  Stress: No Stress Concern Present (09/28/2023)   [employer] of Occupational Health - Occupational Stress Questionnaire    Feeling of Stress: Not at all  Social Connections: Moderately Integrated (09/28/2023)   Social Connection and Isolation Panel    Frequency of Communication with Friends and Family: More than three times a week    Frequency of Social Gatherings with Friends and Family: More than three times a week    Attends Religious Services: More than 4 times per year    Active Member of [employer] or  Organizations: No    Attends Engineer, structural: Never    Marital Status: Married    Tobacco Counseling Counseling given: Not Answered    Clinical Intake:  Pre-visit preparation completed: Yes  Pain : No/denies pain     BMI - recorded: 22.81 Nutritional Risks: None Diabetes: Yes CBG done?: No Did pt. bring in CBG monitor from home?: No  Lab Results  Component Value Date   HGBA1C 7.1 (A) 04/06/2023   HGBA1C 7.0 (H) 10/02/2022   HGBA1C 7.5 (A) 04/01/2022     How often do you need to have someone help you when you read instructions, pamphlets, or other written materials from your doctor or pharmacy?: 1 - Never  Interpreter Needed?: No  Information entered by :: Ashira Kirsten,CMA   Activities of Daily Living     09/28/2023    4:00 PM  In your present state of health, do you have any difficulty performing the following activities:  Hearing? 0  Vision? 0  Difficulty concentrating or making decisions? 0  Walking or climbing stairs? 0  Dressing or bathing? 0  Doing errands, shopping? 0  Preparing Food and eating ? N  Using the Toilet? N  In the past six months, have you accidently leaked urine? N  Do you have problems with loss of bowel control? N  Managing your Medications? N  Managing your Finances? N  Housekeeping or managing your Housekeeping? N    Patient Care Team: Rilla Baller, MD as PCP - General (Family Medicine) [facility] (Ophthalmology)  I have updated your Care Teams any recent Medical Services you may have received from other providers in the past year.     Assessment:   This is a routine wellness examination for Eduardo Pierce.  Hearing/Vision screen Hearing Screening - Comments:: No difficulties Vision Screening - Comments:: Patient declined  Goals Addressed             This Visit's Progress    Patient Stated       To settle an estate        Depression Screen     09/28/2023    4:02 PM 04/06/2023     8:28 AM 04/01/2022    8:32 AM 08/28/2021    3:00 PM 08/22/2020    8:23 AM 08/03/2019    9:35 AM 07/11/2018    8:07 AM  PHQ 2/9 Scores  PHQ - 2 Score 0 0 0 0 0 0 0  PHQ- 9 Score 0 1  0 0  0    Fall Risk     09/28/2023    4:01 PM 04/06/2023    8:28 AM 04/01/2022    8:32 AM 08/28/2021    3:02 PM 08/22/2020    8:23 AM  Fall Risk   Falls in the past year? 0 0 0 0 0  Number falls in past yr: 0   0 0  Injury with Fall? 0   0 0  Risk for fall due to : No Fall Risks   No Fall Risks Medication side effect  Follow up Falls evaluation completed   Falls evaluation completed  Falls evaluation completed;Falls prevention discussed      Data saved with a previous flowsheet row definition    MEDICARE RISK AT HOME:  Medicare Risk at Home Any stairs in or around the home?: No If so, are there any without handrails?: No Home free of loose throw rugs in walkways, pet beds, electrical cords, etc?: Yes Adequate lighting in your home to reduce risk of falls?: Yes Life alert?: No Use of a cane, walker or w/c?: No Grab bars in the bathroom?: Yes Shower chair or bench in shower?: Yes Elevated toilet seat or a handicapped toilet?: Yes  TIMED UP AND GO:  Was the test performed?  No  Cognitive Function: 6CIT completed    08/22/2020    8:27 AM 07/11/2018    8:17 AM 06/17/2017   12:45 PM  MMSE - Mini Mental State Exam  Not completed: Refused    Orientation to time  5 5  Orientation to Place  5 5  Registration  3 3  Attention/ Calculation  0 0  Recall  3 3  Language- name 2 objects  0 0  Language- repeat  1 1  Language- follow 3 step command  0 3  Language- read & follow direction  0 0  Write a sentence  0 0  Copy design  0 0  Total score  17 20        09/28/2023    3:59 PM 08/28/2021    3:04 PM  6CIT Screen  What Year? 0 points 0 points  What month? 0 points 0 points  What time? 0 points 0 points  Count back from 20 0 points 0 points  Months in reverse 0 points 0 points  Repeat phrase 0  points 0 points  Total Score 0 points 0 points    Immunizations Immunization History  Administered Date(s) Administered   INFLUENZA, HIGH DOSE SEASONAL PF 01/24/2019   Influenza Whole 12/02/2004   PFIZER(Purple Top)SARS-COV-2 Vaccination 02/24/2019, 03/21/2019, 10/10/2019   Pfizer Covid-19 Vaccine Bivalent Booster 64yrs & up 11/29/2020   Pneumococcal Conjugate-13 06/22/2017   Pneumococcal Polysaccharide-23 07/19/2018   Td 11/19/2004   Zoster Recombinant(Shingrix) 09/13/2018, 12/12/2018   Zoster, Live 10/18/2012    Screening Tests  Health Maintenance  Topic Date Due   Diabetic kidney evaluation - Urine ACR  12/10/2009   DTaP/Tdap/Td (2 - Tdap) 11/20/2014   Influenza Vaccine  08/20/2023   COVID-19 Vaccine (5 - 2025-26 season) 09/20/2023   Diabetic kidney evaluation - eGFR measurement  10/02/2023   HEMOGLOBIN A1C  10/07/2023   OPHTHALMOLOGY EXAM  10/08/2023   FOOT EXAM  04/05/2024   Medicare Annual Wellness (AWV)  09/27/2024   Colonoscopy  10/15/2028   Pneumococcal Vaccine: 50+ Years  Completed   Hepatitis C Screening  Completed   Zoster Vaccines- Shingrix  Completed   HPV VACCINES  Aged Out   Meningococcal B Vaccine  Aged Out    Health Maintenance Items Addressed:patient declined   Additional Screening:  Vision Screening: Recommended annual ophthalmology exams for early detection of glaucoma and other disorders of the eye. Is the patient up to date with their annual eye exam?  No  Who is the provider or what is the name of the office in which the patient attends annual eye exams?   Dental Screening: Recommended annual dental exams for proper oral hygiene  Community Resource Referral / Chronic Care Management: CRR required this visit?  No   CCM required this visit?  No   Plan:    I have personally reviewed and noted the following in the patient's chart:   Medical and social history Use of alcohol, tobacco or illicit drugs  Current medications and supplements  including opioid prescriptions. Patient is not currently taking opioid prescriptions. Functional ability and status Nutritional status Physical activity Advanced directives List of other physicians Hospitalizations, surgeries, and ER visits in previous 12 months Vitals Screenings to include cognitive, depression, and falls Referrals and appointments  In addition, I have reviewed and discussed with patient certain preventive protocols, quality metrics, and best practice recommendations. A written personalized care plan for preventive services as well as general preventive health recommendations were provided to patient.   Lyle MARLA Right, [place]   09/28/2023   After Visit Summary: (MyChart) Due to this being a telephonic visit, the after visit summary with patients personalized plan was offered to patient via MyChart   Notes: Nothing significant to report at this time.

## 2023-10-03 ENCOUNTER — Other Ambulatory Visit: Payer: Self-pay | Admitting: Family Medicine

## 2023-10-03 DIAGNOSIS — E1122 Type 2 diabetes mellitus with diabetic chronic kidney disease: Secondary | ICD-10-CM

## 2023-10-03 DIAGNOSIS — Z125 Encounter for screening for malignant neoplasm of prostate: Secondary | ICD-10-CM

## 2023-10-03 DIAGNOSIS — E1169 Type 2 diabetes mellitus with other specified complication: Secondary | ICD-10-CM

## 2023-10-05 ENCOUNTER — Other Ambulatory Visit (INDEPENDENT_AMBULATORY_CARE_PROVIDER_SITE_OTHER)

## 2023-10-05 DIAGNOSIS — N183 Type 2 diabetes mellitus with diabetic chronic kidney disease: Secondary | ICD-10-CM

## 2023-10-05 DIAGNOSIS — E785 Hyperlipidemia, unspecified: Secondary | ICD-10-CM | POA: Diagnosis not present

## 2023-10-05 DIAGNOSIS — Z125 Encounter for screening for malignant neoplasm of prostate: Secondary | ICD-10-CM

## 2023-10-05 DIAGNOSIS — E1169 Type 2 diabetes mellitus with other specified complication: Secondary | ICD-10-CM

## 2023-10-05 DIAGNOSIS — E1122 Type 2 diabetes mellitus with diabetic chronic kidney disease: Secondary | ICD-10-CM

## 2023-10-05 LAB — CBC WITH DIFFERENTIAL/PLATELET
Basophils Absolute: 0 K/uL (ref 0.0–0.1)
Basophils Relative: 1.3 % (ref 0.0–3.0)
Eosinophils Absolute: 0.2 K/uL (ref 0.0–0.7)
Eosinophils Relative: 5.4 % — ABNORMAL HIGH (ref 0.0–5.0)
HCT: 43.2 % (ref 39.0–52.0)
Hemoglobin: 14.5 g/dL (ref 13.0–17.0)
Lymphocytes Relative: 20.3 % (ref 12.0–46.0)
Lymphs Abs: 0.7 K/uL (ref 0.7–4.0)
MCHC: 33.4 g/dL (ref 30.0–36.0)
MCV: 97.4 fl (ref 78.0–100.0)
Monocytes Absolute: 0.4 K/uL (ref 0.1–1.0)
Monocytes Relative: 9.7 % (ref 3.0–12.0)
Neutro Abs: 2.3 K/uL (ref 1.4–7.7)
Neutrophils Relative %: 63.3 % (ref 43.0–77.0)
Platelets: 170 K/uL (ref 150.0–400.0)
RBC: 4.44 Mil/uL (ref 4.22–5.81)
RDW: 13.3 % (ref 11.5–15.5)
WBC: 3.7 K/uL — ABNORMAL LOW (ref 4.0–10.5)

## 2023-10-05 LAB — MICROALBUMIN / CREATININE URINE RATIO
Creatinine,U: 92.7 mg/dL
Microalb Creat Ratio: UNDETERMINED mg/g (ref 0.0–30.0)
Microalb, Ur: <0.7 mg/dL

## 2023-10-05 LAB — LIPID PANEL
Cholesterol: 150 mg/dL (ref 0–200)
HDL: 44.9 mg/dL (ref >39.00)
LDL Cholesterol: 83 mg/dL (ref 0–99)
NonHDL: 105.11
Total CHOL/HDL Ratio: 3
Triglycerides: 112 mg/dL (ref 0.0–149.0)
VLDL: 22.4 mg/dL (ref 0.0–40.0)

## 2023-10-05 LAB — COMPREHENSIVE METABOLIC PANEL WITH GFR
ALT: 16 U/L (ref 0–53)
AST: 15 U/L (ref 0–37)
Albumin: 4.6 g/dL (ref 3.5–5.2)
Alkaline Phosphatase: 42 U/L (ref 39–117)
BUN: 25 mg/dL — ABNORMAL HIGH (ref 6–23)
CO2: 25 meq/L (ref 19–32)
Calcium: 9.3 mg/dL (ref 8.4–10.5)
Chloride: 104 meq/L (ref 96–112)
Creatinine, Ser: 1.32 mg/dL (ref 0.40–1.50)
GFR: 53.42 mL/min — ABNORMAL LOW (ref >60.00)
Glucose, Bld: 148 mg/dL — ABNORMAL HIGH (ref 70–99)
Potassium: 4 meq/L (ref 3.5–5.1)
Sodium: 141 meq/L (ref 135–145)
Total Bilirubin: 0.7 mg/dL (ref 0.2–1.2)
Total Protein: 6.5 g/dL (ref 6.0–8.3)

## 2023-10-05 LAB — HEMOGLOBIN A1C: Hgb A1c MFr Bld: 7.5 % — ABNORMAL HIGH (ref 4.6–6.5)

## 2023-10-05 LAB — PSA: PSA: 2.15 ng/mL (ref 0.10–4.00)

## 2023-10-05 LAB — VITAMIN D 25 HYDROXY (VIT D DEFICIENCY, FRACTURES): VITD: 20.01 ng/mL — ABNORMAL LOW (ref 30.00–100.00)

## 2023-10-05 LAB — PHOSPHORUS: Phosphorus: 3.9 mg/dL (ref 2.3–4.6)

## 2023-10-05 LAB — VITAMIN B12: Vitamin B-12: 483 pg/mL (ref 211–911)

## 2023-10-06 LAB — PARATHYROID HORMONE, INTACT (NO CA): PTH: 22 pg/mL (ref 16–77)

## 2023-10-07 ENCOUNTER — Ambulatory Visit: Payer: Self-pay | Admitting: Family Medicine

## 2023-10-12 ENCOUNTER — Ambulatory Visit: Payer: Self-pay | Admitting: Family Medicine

## 2023-10-12 ENCOUNTER — Ambulatory Visit: Admitting: Family Medicine

## 2023-10-12 ENCOUNTER — Encounter: Payer: Self-pay | Admitting: Family Medicine

## 2023-10-12 VITALS — BP 110/60 | HR 59 | Temp 97.9°F | Ht 67.0 in | Wt 149.0 lb

## 2023-10-12 DIAGNOSIS — Z0000 Encounter for general adult medical examination without abnormal findings: Secondary | ICD-10-CM

## 2023-10-12 DIAGNOSIS — E1169 Type 2 diabetes mellitus with other specified complication: Secondary | ICD-10-CM

## 2023-10-12 DIAGNOSIS — E1122 Type 2 diabetes mellitus with diabetic chronic kidney disease: Secondary | ICD-10-CM

## 2023-10-12 DIAGNOSIS — N529 Male erectile dysfunction, unspecified: Secondary | ICD-10-CM

## 2023-10-12 DIAGNOSIS — E785 Hyperlipidemia, unspecified: Secondary | ICD-10-CM

## 2023-10-12 DIAGNOSIS — Z7984 Long term (current) use of oral hypoglycemic drugs: Secondary | ICD-10-CM

## 2023-10-12 DIAGNOSIS — N183 Chronic kidney disease, stage 3 unspecified: Secondary | ICD-10-CM

## 2023-10-12 DIAGNOSIS — Z7189 Other specified counseling: Secondary | ICD-10-CM

## 2023-10-12 DIAGNOSIS — Z23 Encounter for immunization: Secondary | ICD-10-CM | POA: Diagnosis not present

## 2023-10-12 DIAGNOSIS — D72819 Decreased white blood cell count, unspecified: Secondary | ICD-10-CM

## 2023-10-12 DIAGNOSIS — E559 Vitamin D deficiency, unspecified: Secondary | ICD-10-CM

## 2023-10-12 DIAGNOSIS — Z87448 Personal history of other diseases of urinary system: Secondary | ICD-10-CM

## 2023-10-12 LAB — POC URINALSYSI DIPSTICK (AUTOMATED)
Bilirubin, UA: NEGATIVE
Blood, UA: NEGATIVE
Glucose, UA: POSITIVE — AB
Ketones, UA: NEGATIVE
Leukocytes, UA: NEGATIVE
Nitrite, UA: NEGATIVE
Protein, UA: NEGATIVE
Spec Grav, UA: 1.015 (ref 1.010–1.025)
Urobilinogen, UA: 0.2 U/dL
pH, UA: 5.5 (ref 5.0–8.0)

## 2023-10-12 MED ORDER — LISINOPRIL 2.5 MG PO TABS: 2.5000 mg | ORAL_TABLET | Freq: Every day | ORAL | 3 refills | Status: AC

## 2023-10-12 MED ORDER — VITAMIN D3 25 MCG (1000 UT) PO CAPS: 1.0000 | ORAL_CAPSULE | Freq: Every day | ORAL | Status: AC

## 2023-10-12 MED ORDER — METFORMIN HCL 1000 MG PO TABS: 1000.0000 mg | ORAL_TABLET | Freq: Two times a day (BID) | ORAL | 3 refills | Status: AC

## 2023-10-12 MED ORDER — SILDENAFIL CITRATE 50 MG PO TABS: 50.0000 mg | ORAL_TABLET | Freq: Every day | ORAL | 6 refills | Status: AC | PRN

## 2023-10-12 MED ORDER — PIOGLITAZONE HCL 15 MG PO TABS: 15.0000 mg | ORAL_TABLET | Freq: Every day | ORAL | 3 refills | Status: AC

## 2023-10-12 MED ORDER — GLIPIZIDE 5 MG PO TABS: 5.0000 mg | ORAL_TABLET | Freq: Two times a day (BID) | ORAL | 3 refills | Status: AC

## 2023-10-12 MED ORDER — FREESTYLE LITE TEST VI STRP: ORAL_STRIP | 3 refills | Status: DC

## 2023-10-12 MED ORDER — EMPAGLIFLOZIN 25 MG PO TABS: 25.0000 mg | ORAL_TABLET | Freq: Every day | ORAL | 11 refills | Status: AC

## 2023-10-12 MED ORDER — SIMVASTATIN 20 MG PO TABS
20.0000 mg | ORAL_TABLET | Freq: Every day | ORAL | 3 refills | Status: AC
Start: 2023-10-12 — End: ?

## 2023-10-12 NOTE — Assessment & Plan Note (Signed)
 Preventative protocols reviewed and updated unless pt declined. Discussed healthy diet and lifestyle.

## 2023-10-12 NOTE — Assessment & Plan Note (Signed)
Chronic, mild. Continue to monitor.  

## 2023-10-12 NOTE — Assessment & Plan Note (Signed)
Mild, chronic, stable.

## 2023-10-12 NOTE — Assessment & Plan Note (Signed)
 Continue viagra PRN

## 2023-10-12 NOTE — Assessment & Plan Note (Signed)
 Chronic, adequate on current 4 drug regimen. Discussed possible addition of insulin if A1c trends up.  RTC 6 mo DM f/u visit.

## 2023-10-12 NOTE — Assessment & Plan Note (Signed)
 Rec start vit D 1000 units daily.

## 2023-10-12 NOTE — Patient Instructions (Addendum)
 Prevnar-20 today (pneumonia shot)  Start vitamin D3 1000 units daily  You are doing well today Return as needed or in 6 months for diabetes follow up visit

## 2023-10-12 NOTE — Progress Notes (Signed)
 Ph: [phone] Fax: [phone]   Patient ID: Eduardo Pierce, male    DOB: 1950/02/21, 73 y.o.   MRN: [id number]  This visit was conducted in person.  BP 110/60   Pulse (!) 59   Temp 97.9 F (36.6 C) (Oral)   Ht 5' 7 (1.702 m)   Wt 149 lb (67.6 kg)   SpO2 93%   BMI 23.34 kg/m   BP Readings from Last 3 Encounters:  10/12/23 110/60  09/28/23 128/66  04/06/23 128/66    CC: CPE Subjective:   HPI: Eduardo Pierce is a 73 y.o. male presenting on 10/12/2023 for Annual Exam   Saw health advisor earlier this month for medicare wellness visit. Note reviewed.   No results found.  Flowsheet Row Office Visit from 10/12/2023 in [facility]  PHQ-2 Total Score 0       10/12/2023    8:33 AM 09/28/2023    4:01 PM 04/06/2023    8:28 AM 04/01/2022    8:32 AM 08/28/2021    3:02 PM  Fall Risk   Falls in the past year? 0 0 0 0 0  Number falls in past yr: 0 0   0  Injury with Fall? 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   Not checking BP at home.   DM - on jardiance  25mg  daily, glipizide  5mg  bid, metformin  1000mg  bid, actos  15mg  daily  Preventative: Colon screening - 06/08/2011. Prolapse type polyp, rpt due 10 yrs Marianne)  Colonoscopy 09/2021 - 1 TA, consider rpt 7-10 yrs Georgean) Prostate - yearly PSA check. Nocturia x1, strong stream.  Lung cancer screening - quit smoking 1988  Flu shot - yearly  COVID vaccine - Pfizer 02/2019, 03/2019, booster 09/2019, bivalent 11/2020 Prevnar-13 06/2017. Pneumovax-23 06/2018, prevnar-20 today.  Td - 2006  Zostavax 09/2012  Shingrix - 08/2018, 11/2018  Advanced directive discussion: scanned 03/2022. Full code based on previous discussion but does not want prolonged life  support if terminal condition. HCPOA form brought and scanned today 09/2022 - wife Meade then daughter Amy are HCPOA.  Seat belt use discussed  Sunscreen use discussed. No changing moles on skin. Sees derm yearly - overdue ([facility]).  Sleep - averaging 7-8 hours/night Ex smoker. Quit 1988. Wife quit smoking as well Alcohol - none  Dentist - Q105mo  Eye exam - Dr Octavia  Bowel - no constipation Bladder - no incontinence  [name]   Married lives wife 3 children Retired 2014 Activity: Exercises [place] [employer] 3 times per week - treadmill  Diet: good water, fruits/vegetables daily. Sugar free diet      Relevant past medical, surgical, family and social history reviewed and updated as indicated. Interim medical history since our last visit reviewed. Allergies and medications reviewed and updated. Outpatient Medications Prior to Visit  Medication Sig Dispense Refill   aspirin  EC 81 MG tablet Take 1 tablet (81 mg total) by mouth every other day.     Cyanocobalamin  (VITAMIN B-12 CR) 1000 MCG TBCR Take 1 tablet by mouth daily.     Lancets MISC Use to check blood sugar daily     OVER THE COUNTER MEDICATION  daily. [facility] EYE DROPS ONCE A DAY-ONE DROP EACH EYE     vitamin C (ASCORBIC ACID) 500 MG tablet Take 500 mg by mouth daily.     empagliflozin  (JARDIANCE ) 25 MG TABS tablet Take 1 tablet (25 mg total) by mouth daily before breakfast. 30 tablet 11   FREESTYLE LITE test strip TEST BLOOD SUGAR EVERY DAY AND AS NEEDED 300 each 3   glipiZIDE  (GLUCOTROL ) 5 MG tablet Take 1 tablet (5 mg total) by mouth 2 (two) times daily before a meal. 180 tablet 4   lisinopril  (ZESTRIL ) 2.5 MG tablet Take 1 tablet (2.5 mg total) by mouth daily. 90 tablet 4   metFORMIN  (GLUCOPHAGE ) 1000 MG tablet Take 1 tablet (1,000 mg total) by mouth 2 (two) times daily with a meal. 180 tablet 4   pioglitazone  (ACTOS ) 15 MG tablet TAKE 1 TABLET (15 MG TOTAL) BY MOUTH DAILY. 90 tablet 1   sildenafil  (VIAGRA ) 50 MG tablet Take 1 tablet  (50 mg total) by mouth daily as needed for erectile dysfunction. 10 tablet 6   simvastatin  (ZOCOR ) 20 MG tablet Take 1 tablet (20 mg total) by mouth daily at 6 PM. 90 tablet 4   No facility-administered medications prior to visit.     Per HPI unless specifically indicated in ROS section below Review of Systems  Constitutional:  Negative for activity change, appetite change, chills, fatigue, fever and unexpected weight change.  HENT:  Negative for hearing loss.   Eyes:  Negative for visual disturbance.  Respiratory:  Negative for cough, chest tightness, shortness of breath and wheezing.   Cardiovascular:  Negative for chest pain, palpitations and leg swelling.  Gastrointestinal:  Negative for abdominal distention, abdominal pain, blood in stool, constipation, diarrhea, nausea and vomiting.  Genitourinary:  Negative for difficulty urinating and hematuria.  Musculoskeletal:  Negative for arthralgias, myalgias and neck pain.  Skin:  Negative for rash.  Neurological:  Negative for dizziness, seizures, syncope and headaches.  Hematological:  Negative for adenopathy. Bruises/bleeds easily.  Psychiatric/Behavioral:  Negative for dysphoric mood. The patient is not nervous/anxious.     Objective:  BP 110/60   Pulse (!) 59   Temp 97.9 F (36.6 C) (Oral)   Ht 5' 7 (1.702 m)   Wt 149 lb (67.6 kg)   SpO2 93%   BMI 23.34 kg/m   Wt Readings from Last 3 Encounters:  10/12/23 149 lb (67.6 kg)  09/28/23 150 lb (68 kg)  04/06/23 153 lb 6 oz (69.6 kg)      Physical Exam Vitals and nursing note reviewed.  Constitutional:      General: He is not in acute distress.    Appearance: Normal appearance. He is well-developed. He is not ill-appearing.  HENT:     Head: Normocephalic and atraumatic.     Right Ear: Hearing, tympanic membrane, ear canal and external ear normal.     Left Ear: Hearing, tympanic membrane, ear canal and external ear normal.     Mouth/Throat:     Mouth: Mucous membranes are  moist.     Pharynx: Oropharynx is clear. No oropharyngeal exudate or posterior oropharyngeal erythema.  Eyes:     General: No scleral icterus.    Extraocular Movements: Extraocular movements intact.     Conjunctiva/sclera: Conjunctivae normal.     Pupils: Pupils are equal, round, and reactive to light.  Neck:     Thyroid: No thyroid mass or thyromegaly.     Vascular: No carotid bruit.  Cardiovascular:  Rate and Rhythm: Normal rate and regular rhythm.     Pulses: Normal pulses.          Radial pulses are 2+ on the right side and 2+ on the left side.     Heart sounds: Normal heart sounds. No murmur heard. Pulmonary:     Effort: Pulmonary effort is normal. No respiratory distress.     Breath sounds: Normal breath sounds. No wheezing, rhonchi or rales.  Abdominal:     General: Bowel sounds are normal. There is no distension.     Palpations: Abdomen is soft. There is no mass.     Tenderness: There is no abdominal tenderness. There is no guarding or rebound.     Hernia: No hernia is present.  Musculoskeletal:        General: Normal range of motion.     Cervical back: Normal range of motion and neck supple.     Right lower leg: No edema.     Left lower leg: No edema.  Lymphadenopathy:     Cervical: No cervical adenopathy.  Skin:    General: Skin is warm and dry.     Findings: No rash.  Neurological:     General: No focal deficit present.     Mental Status: He is alert and oriented to person, place, and time.  Psychiatric:        Mood and Affect: Mood normal.        Behavior: Behavior normal.        Thought Content: Thought content normal.        Judgment: Judgment normal.       Results for orders placed or performed in visit on 10/12/23  POCT Urinalysis Dipstick (Automated)   Collection Time: 10/12/23  9:43 AM  Result Value Ref Range   Color, UA yellow    Clarity, UA clear    Glucose, UA Positive (A) Negative   Bilirubin, UA negative    Ketones, UA negative    Spec  Grav, UA 1.015 1.010 - 1.025   Blood, UA negative    pH, UA 5.5 5.0 - 8.0   Protein, UA Negative Negative   Urobilinogen, UA 0.2 0.2 or 1.0 E.U./dL   Nitrite, UA negative    Leukocytes, UA Negative Negative    Assessment & Plan:   Problem List Items Addressed This Visit     Advanced care planning/counseling discussion (Chronic)   Previously discussed      Health maintenance examination - Primary (Chronic)   Preventative protocols reviewed and updated unless pt declined. Discussed healthy diet and lifestyle.       Type 2 diabetes mellitus with other specified complication (HCC)   Chronic, adequate on current 4 drug regimen. Discussed possible addition of insulin if A1c trends up.  RTC 6 mo DM f/u visit.       Relevant Medications   empagliflozin  (JARDIANCE ) 25 MG TABS tablet   glipiZIDE  (GLUCOTROL ) 5 MG tablet   lisinopril  (ZESTRIL ) 2.5 MG tablet   metFORMIN  (GLUCOPHAGE ) 1000 MG tablet   pioglitazone  (ACTOS ) 15 MG tablet   simvastatin  (ZOCOR ) 20 MG tablet   Hyperlipidemia associated with type 2 diabetes mellitus (HCC)   Chronic, stable period on simvastatin  - continue. Continue aspirin  every other day. The 10-year ASCVD risk score (Arnett DK, et al., 2019) is: 32.6%   Values used to calculate the score:     Age: 51 years     Clincally relevant sex: Male     Is Non-Hispanic  African American: No     Diabetic: Yes     Tobacco smoker: No     Systolic Blood Pressure: 110 mmHg     Is BP treated: Yes     HDL Cholesterol: 44.9 mg/dL     Total Cholesterol: 150 mg/dL       Relevant Medications   empagliflozin  (JARDIANCE ) 25 MG TABS tablet   glipiZIDE  (GLUCOTROL ) 5 MG tablet   lisinopril  (ZESTRIL ) 2.5 MG tablet   metFORMIN  (GLUCOPHAGE ) 1000 MG tablet   pioglitazone  (ACTOS ) 15 MG tablet   sildenafil  (VIAGRA ) 50 MG tablet   simvastatin  (ZOCOR ) 20 MG tablet   Leukopenia   Mild, chronic, stable.       History of hematuria   Update UA today in setting of actos  use.        Relevant Orders   POCT Urinalysis Dipstick (Automated) (Completed)   Erectile dysfunction   Continue viagra  PRN      CKD stage 3 due to type 2 diabetes mellitus (HCC)   Chronic, mild. Continue to monitor.       Relevant Medications   empagliflozin  (JARDIANCE ) 25 MG TABS tablet   glipiZIDE  (GLUCOTROL ) 5 MG tablet   lisinopril  (ZESTRIL ) 2.5 MG tablet   metFORMIN  (GLUCOPHAGE ) 1000 MG tablet   pioglitazone  (ACTOS ) 15 MG tablet   simvastatin  (ZOCOR ) 20 MG tablet   Vitamin D  deficiency   Rec start vit D 1000 units daily.       Other Visit Diagnoses       Need for vaccination against Streptococcus pneumoniae       Relevant Orders   Pneumococcal conjugate vaccine 20-valent (Prevnar 20) (Completed)        Meds ordered this encounter  Medications   empagliflozin  (JARDIANCE ) 25 MG TABS tablet    Sig: Take 1 tablet (25 mg total) by mouth daily before breakfast.    Dispense:  30 tablet    Refill:  11   glucose blood (FREESTYLE LITE) test strip    Sig: Use as instructed    Dispense:  300 each    Refill:  3   glipiZIDE  (GLUCOTROL ) 5 MG tablet    Sig: Take 1 tablet (5 mg total) by mouth 2 (two) times daily before a meal.    Dispense:  180 tablet    Refill:  3   lisinopril  (ZESTRIL ) 2.5 MG tablet    Sig: Take 1 tablet (2.5 mg total) by mouth daily.    Dispense:  90 tablet    Refill:  3   metFORMIN  (GLUCOPHAGE ) 1000 MG tablet    Sig: Take 1 tablet (1,000 mg total) by mouth 2 (two) times daily with a meal.    Dispense:  180 tablet    Refill:  3   pioglitazone  (ACTOS ) 15 MG tablet    Sig: Take 1 tablet (15 mg total) by mouth daily.    Dispense:  90 tablet    Refill:  3   sildenafil  (VIAGRA ) 50 MG tablet    Sig: Take 1 tablet (50 mg total) by mouth daily as needed for erectile dysfunction.    Dispense:  10 tablet    Refill:  6   simvastatin  (ZOCOR ) 20 MG tablet    Sig: Take 1 tablet (20 mg total) by mouth daily at 6 PM.    Dispense:  90 tablet    Refill:  3    Cholecalciferol (VITAMIN D3) 25 MCG (1000 UT) CAPS    Sig: Take 1 capsule (1,000 Units total)  by mouth daily.    Orders Placed This Encounter  Procedures   Pneumococcal conjugate vaccine 20-valent (Prevnar 20)   POCT Urinalysis Dipstick (Automated)    Patient Instructions  Prevnar-20 today (pneumonia shot)  Start vitamin D3 1000 units daily  You are doing well today Return as needed or in 6 months for diabetes follow up visit   Follow up plan: Return in about 6 months (around 04/10/2024) for follow up visit.  Anton Blas, MD

## 2023-10-12 NOTE — Assessment & Plan Note (Signed)
 Previously discussed.

## 2023-10-12 NOTE — Assessment & Plan Note (Addendum)
 Update UA today in setting of actos  use.

## 2023-10-12 NOTE — Assessment & Plan Note (Addendum)
 Chronic, stable period on simvastatin  - continue. Continue aspirin  every other day. The 10-year ASCVD risk score (Arnett DK, et al., 2019) is: 32.6%   Values used to calculate the score:     Age: 73 years     Clincally relevant sex: Male     Is Non-Hispanic African American: No     Diabetic: Yes     Tobacco smoker: No     Systolic Blood Pressure: 110 mmHg     Is BP treated: Yes     HDL Cholesterol: 44.9 mg/dL     Total Cholesterol: 150 mg/dL

## 2023-10-14 ENCOUNTER — Other Ambulatory Visit: Payer: Self-pay

## 2023-10-14 DIAGNOSIS — E1169 Type 2 diabetes mellitus with other specified complication: Secondary | ICD-10-CM

## 2023-10-14 MED ORDER — FREESTYLE LITE TEST VI STRP: ORAL_STRIP | 3 refills | Status: AC

## 2023-10-15 NOTE — Progress Notes (Signed)
 Eduardo Pierce                                          MRN: [id number]   10/15/2023   The [facility] Quality Team Specialist reviewed this patient medical record for the purposes of chart review for care gap closure. The following were reviewed: abstraction for care gap closure-kidney health evaluation for diabetes:eGFR  and uACR.    [facility] Quality Team

## 2023-11-10 DIAGNOSIS — Z961 Presence of intraocular lens: Secondary | ICD-10-CM | POA: Diagnosis not present

## 2023-11-10 DIAGNOSIS — H40013 Open angle with borderline findings, low risk, bilateral: Secondary | ICD-10-CM | POA: Diagnosis not present

## 2023-11-10 DIAGNOSIS — H11131 Conjunctival pigmentations, right eye: Secondary | ICD-10-CM | POA: Diagnosis not present

## 2023-11-10 DIAGNOSIS — E119 Type 2 diabetes mellitus without complications: Secondary | ICD-10-CM | POA: Diagnosis not present

## 2023-11-10 LAB — OPHTHALMOLOGY REPORT-SCANNED

## 2023-11-18 DIAGNOSIS — L570 Actinic keratosis: Secondary | ICD-10-CM | POA: Diagnosis not present

## 2023-11-18 DIAGNOSIS — L723 Sebaceous cyst: Secondary | ICD-10-CM | POA: Diagnosis not present

## 2024-02-04 NOTE — Progress Notes (Signed)
 Eduardo Pierce                                          MRN: [id number]   02/04/2024   The [facility] Quality Team Specialist reviewed this patient medical record for the purposes of chart review for care gap closure. The following were reviewed: abstraction for care gap closure-glycemic status assessment.    [facility] Quality Team

## 2024-04-12 ENCOUNTER — Ambulatory Visit: Admitting: Family Medicine
